# Patient Record
Sex: Female | Born: 1955
Health system: Southern US, Community
[De-identification: ages and names within clinical notes are randomized; demographics above are authoritative.]

## PROBLEM LIST (undated history)

## (undated) DIAGNOSIS — F419 Anxiety disorder, unspecified: Secondary | ICD-10-CM

## (undated) DIAGNOSIS — M1990 Unspecified osteoarthritis, unspecified site: Secondary | ICD-10-CM

## (undated) DIAGNOSIS — H269 Unspecified cataract: Secondary | ICD-10-CM

## (undated) DIAGNOSIS — T7840XA Allergy, unspecified, initial encounter: Secondary | ICD-10-CM

## (undated) DIAGNOSIS — E785 Hyperlipidemia, unspecified: Secondary | ICD-10-CM

## (undated) DIAGNOSIS — I10 Essential (primary) hypertension: Secondary | ICD-10-CM

## (undated) HISTORY — PX: POLYPECTOMY: SHX149

## (undated) HISTORY — PX: COLONOSCOPY: SHX174

## (undated) HISTORY — DX: Hyperlipidemia, unspecified: E78.5

## (undated) HISTORY — DX: Essential (primary) hypertension: I10

## (undated) HISTORY — DX: Allergy, unspecified, initial encounter: T78.40XA

## (undated) HISTORY — DX: Unspecified cataract: H26.9

## (undated) HISTORY — DX: Anxiety disorder, unspecified: F41.9

## (undated) HISTORY — PX: ECTOPIC PREGNANCY SURGERY: SHX613

---

## 1999-06-25 ENCOUNTER — Ambulatory Visit (HOSPITAL_COMMUNITY): Admission: RE | Admit: 1999-06-25 | Discharge: 1999-06-25 | Payer: Self-pay | Admitting: Family Medicine

## 1999-06-25 ENCOUNTER — Encounter: Payer: Self-pay | Admitting: Family Medicine

## 2000-07-19 ENCOUNTER — Other Ambulatory Visit: Admission: RE | Admit: 2000-07-19 | Discharge: 2000-07-19 | Payer: Self-pay | Admitting: Internal Medicine

## 2000-07-26 ENCOUNTER — Encounter: Admission: RE | Admit: 2000-07-26 | Discharge: 2000-07-26 | Payer: Self-pay | Admitting: Internal Medicine

## 2000-07-26 ENCOUNTER — Encounter: Payer: Self-pay | Admitting: Internal Medicine

## 2000-08-03 ENCOUNTER — Ambulatory Visit (HOSPITAL_COMMUNITY): Admission: RE | Admit: 2000-08-03 | Discharge: 2000-08-03 | Payer: Self-pay | Admitting: Gynecology

## 2000-08-03 ENCOUNTER — Encounter: Payer: Self-pay | Admitting: Internal Medicine

## 2001-08-09 ENCOUNTER — Other Ambulatory Visit: Admission: RE | Admit: 2001-08-09 | Discharge: 2001-08-09 | Payer: Self-pay | Admitting: Internal Medicine

## 2003-09-18 ENCOUNTER — Ambulatory Visit (HOSPITAL_COMMUNITY): Admission: RE | Admit: 2003-09-18 | Discharge: 2003-09-18 | Payer: Self-pay | Admitting: Internal Medicine

## 2003-10-23 ENCOUNTER — Encounter: Admission: RE | Admit: 2003-10-23 | Discharge: 2003-10-23 | Payer: Self-pay | Admitting: Internal Medicine

## 2004-02-27 ENCOUNTER — Emergency Department (HOSPITAL_COMMUNITY): Admission: EM | Admit: 2004-02-27 | Discharge: 2004-02-28 | Payer: Self-pay | Admitting: Emergency Medicine

## 2004-08-18 ENCOUNTER — Emergency Department (HOSPITAL_COMMUNITY): Admission: EM | Admit: 2004-08-18 | Discharge: 2004-08-18 | Payer: Self-pay | Admitting: Family Medicine

## 2004-10-22 ENCOUNTER — Emergency Department (HOSPITAL_COMMUNITY): Admission: EM | Admit: 2004-10-22 | Discharge: 2004-10-22 | Payer: Self-pay | Admitting: Family Medicine

## 2004-10-24 ENCOUNTER — Encounter: Admission: RE | Admit: 2004-10-24 | Discharge: 2005-01-22 | Payer: Self-pay | Admitting: Internal Medicine

## 2005-08-14 ENCOUNTER — Encounter: Admission: RE | Admit: 2005-08-14 | Discharge: 2005-08-14 | Payer: Self-pay | Admitting: Internal Medicine

## 2006-05-24 ENCOUNTER — Emergency Department (HOSPITAL_COMMUNITY): Admission: EM | Admit: 2006-05-24 | Discharge: 2006-05-24 | Payer: Self-pay | Admitting: Emergency Medicine

## 2006-06-17 ENCOUNTER — Encounter: Admission: RE | Admit: 2006-06-17 | Discharge: 2006-06-17 | Payer: Self-pay | Admitting: Internal Medicine

## 2007-07-14 ENCOUNTER — Ambulatory Visit (HOSPITAL_COMMUNITY): Admission: RE | Admit: 2007-07-14 | Discharge: 2007-07-14 | Payer: Self-pay | Admitting: Internal Medicine

## 2009-10-03 ENCOUNTER — Ambulatory Visit (HOSPITAL_COMMUNITY): Admission: RE | Admit: 2009-10-03 | Discharge: 2009-10-03 | Payer: Self-pay | Admitting: Internal Medicine

## 2010-09-28 ENCOUNTER — Encounter: Payer: Self-pay | Admitting: Internal Medicine

## 2010-10-08 ENCOUNTER — Other Ambulatory Visit (HOSPITAL_COMMUNITY): Payer: Self-pay | Admitting: Internal Medicine

## 2010-10-08 DIAGNOSIS — Z139 Encounter for screening, unspecified: Secondary | ICD-10-CM

## 2010-10-08 DIAGNOSIS — Z1231 Encounter for screening mammogram for malignant neoplasm of breast: Secondary | ICD-10-CM

## 2010-10-15 ENCOUNTER — Encounter (HOSPITAL_COMMUNITY): Payer: Self-pay

## 2010-10-15 ENCOUNTER — Ambulatory Visit (HOSPITAL_COMMUNITY)
Admission: RE | Admit: 2010-10-15 | Discharge: 2010-10-15 | Disposition: A | Discharge status: Home / Self Care | Payer: BC Managed Care – PPO | Source: Ambulatory Visit | Attending: Internal Medicine | Admitting: Internal Medicine

## 2010-10-15 DIAGNOSIS — Z1231 Encounter for screening mammogram for malignant neoplasm of breast: Secondary | ICD-10-CM | POA: Insufficient documentation

## 2011-09-15 ENCOUNTER — Emergency Department (INDEPENDENT_AMBULATORY_CARE_PROVIDER_SITE_OTHER): Payer: BC Managed Care – PPO

## 2011-09-15 ENCOUNTER — Emergency Department (INDEPENDENT_AMBULATORY_CARE_PROVIDER_SITE_OTHER)
Admission: EM | Admit: 2011-09-15 | Discharge: 2011-09-15 | Disposition: A | Discharge status: Home / Self Care | Payer: BC Managed Care – PPO | Source: Home / Self Care | Attending: Emergency Medicine | Admitting: Emergency Medicine

## 2011-09-15 ENCOUNTER — Encounter (HOSPITAL_COMMUNITY): Payer: Self-pay | Admitting: *Deleted

## 2011-09-15 DIAGNOSIS — R059 Cough, unspecified: Secondary | ICD-10-CM

## 2011-09-15 DIAGNOSIS — R05 Cough: Secondary | ICD-10-CM

## 2011-09-15 HISTORY — DX: Unspecified osteoarthritis, unspecified site: M19.90

## 2011-09-15 MED ORDER — PREDNISONE 10 MG PO TABS: 40.0000 mg | ORAL_TABLET | Freq: Every day | ORAL | Status: DC

## 2011-09-15 MED ORDER — PREDNISONE 20 MG PO TABS: 40.0000 mg | ORAL_TABLET | Freq: Every day | ORAL | Status: DC

## 2011-09-15 NOTE — ED Provider Notes (Addendum)
History     CSN: [id number]  Arrival date & time 09/15/11  1354   First MD Initiated Contact with Patient 09/15/11 1422      Chief Complaint  Patient presents with  . Cough    (Consider location/radiation/quality/duration/timing/severity/associated sxs/prior treatment) HPI Comments: Been coughing so much and hard that my upper back and  its hurting all over" No fevers, my doctor has treated me with antibiotics and steroids and I'm still coughing" Nothing seems to stop this cough" No SOB  Patient is a 56 y.o. female presenting with cough. The history is provided by the patient.  Cough This is a recurrent problem. Episode onset: x 3 weeks. The problem occurs constantly. The problem has not changed since onset.The cough is productive of sputum. There has been no fever. Associated symptoms include chest pain, chills, ear pain, headaches and myalgias. Pertinent negatives include no sweats, no weight loss, no rhinorrhea, no shortness of breath and no wheezing. She has tried cough syrup for the symptoms. The treatment provided no relief. Her past medical history does not include emphysema or asthma.    Past Medical History  Diagnosis Date  . Asthma   . Arthritis     History reviewed. No pertinent past surgical history.  Family History  Problem Relation Age of Onset  . Heart failure Mother   . Cancer Father   . Diabetes Sister     History  Substance Use Topics  . Smoking status: Never Smoker   . Smokeless tobacco: Not on file  . Alcohol Use: No    OB History    Grav Para Term Preterm Abortions TAB SAB Ect Mult Living                  Review of Systems  Constitutional: Positive for chills. Negative for fever, weight loss, activity change, appetite change and fatigue.  HENT: Positive for ear pain. Negative for rhinorrhea.   Eyes: Negative for photophobia.  Respiratory: Positive for cough. Negative for shortness of breath and wheezing.   Cardiovascular: Positive for chest  pain.  Genitourinary: Negative for dysuria.  Musculoskeletal: Positive for myalgias.  Neurological: Positive for headaches.    Allergies  Codeine  Home Medications   Current Outpatient Rx  Name Route Sig Dispense Refill  . BENZONATATE 100 MG PO CAPS Oral Take 100 mg by mouth 2 (two) times daily as needed.      Marland Kitchen MONTELUKAST SODIUM 10 MG PO TABS Oral Take 10 mg by mouth at bedtime.      Marland Kitchen PREDNISONE 20 MG PO TABS Oral Take 2 tablets (40 mg total) by mouth daily. 15 tablet 0    BP 122/61  Pulse 99  Temp(Src) 97.7 F (36.5 C) (Oral)  Resp 16  SpO2 100%  Physical Exam  Nursing note and vitals reviewed. Constitutional: She appears well-developed and well-nourished.  Non-toxic appearance. She does not have a sickly appearance. She does not appear ill. No distress.  HENT:  Mouth/Throat: Uvula is midline, oropharynx is clear and moist and mucous membranes are normal.  Eyes: Conjunctivae are normal.  Neck: Neck supple. No JVD present.  Cardiovascular: Normal rate.   Pulmonary/Chest: Effort normal and breath sounds normal. No respiratory distress. She has no decreased breath sounds. She has no wheezes. She has no rhonchi. She has no rales. She exhibits no tenderness.  Lymphadenopathy:    She has no cervical adenopathy.  Skin: No erythema.    ED Course  Procedures (including critical care time)  Labs Reviewed - No data to display Dg Chest 2 View  09/15/2011  *RADIOLOGY REPORT*  Clinical Data: Cough.  CHEST - 2 VIEW  Comparison: 06/17/2006  Findings: Heart and mediastinal contours are within normal limits. No focal opacities or effusions.  No acute bony abnormality.  IMPRESSION: No active cardiopulmonary disease.  Original Report Authenticated By: Cyndie Chime, M.D.     1. Cough       MDM  Cough- x 3 weeks- recurrent after PCP treatment with antibiotics- cough suppressants and steroids. Upon discharge patient described also having a headache and ears hurting and shoulder  pain.         Jimmie Molly, MD 09/15/11 1515  Jimmie Molly, MD 09/15/11 [phone]

## 2011-09-15 NOTE — ED Notes (Signed)
Pt reports she was treated by her PCP with Zpack, tessalon pearls, and prednisone for asthma and bronchitis the last week.  Today she is not feeling any better.  She has chills, dry cough, weakness and generalized aching.

## 2011-10-06 ENCOUNTER — Other Ambulatory Visit (HOSPITAL_COMMUNITY): Payer: Self-pay | Admitting: Internal Medicine

## 2011-10-06 DIAGNOSIS — Z1231 Encounter for screening mammogram for malignant neoplasm of breast: Secondary | ICD-10-CM

## 2011-11-04 ENCOUNTER — Ambulatory Visit (HOSPITAL_COMMUNITY)
Admission: RE | Admit: 2011-11-04 | Discharge: 2011-11-04 | Disposition: A | Discharge status: Home / Self Care | Payer: BC Managed Care – PPO | Source: Ambulatory Visit | Attending: Internal Medicine | Admitting: Internal Medicine

## 2011-11-04 DIAGNOSIS — Z1231 Encounter for screening mammogram for malignant neoplasm of breast: Secondary | ICD-10-CM | POA: Insufficient documentation

## 2013-02-23 ENCOUNTER — Other Ambulatory Visit: Payer: Self-pay | Admitting: Nurse Practitioner

## 2013-02-23 ENCOUNTER — Ambulatory Visit
Admission: RE | Admit: 2013-02-23 | Discharge: 2013-02-23 | Disposition: A | Discharge status: Home / Self Care | Payer: BC Managed Care – PPO | Source: Ambulatory Visit | Attending: Nurse Practitioner | Admitting: Nurse Practitioner

## 2013-02-23 DIAGNOSIS — R2232 Localized swelling, mass and lump, left upper limb: Secondary | ICD-10-CM

## 2013-02-28 ENCOUNTER — Other Ambulatory Visit: Payer: Self-pay | Admitting: Nurse Practitioner

## 2013-02-28 DIAGNOSIS — R2231 Localized swelling, mass and lump, right upper limb: Secondary | ICD-10-CM

## 2013-03-09 ENCOUNTER — Other Ambulatory Visit (HOSPITAL_COMMUNITY): Payer: Self-pay | Admitting: Internal Medicine

## 2013-03-09 DIAGNOSIS — Z1231 Encounter for screening mammogram for malignant neoplasm of breast: Secondary | ICD-10-CM

## 2013-03-20 ENCOUNTER — Ambulatory Visit (HOSPITAL_COMMUNITY): Payer: BC Managed Care – PPO

## 2013-04-14 ENCOUNTER — Ambulatory Visit (HOSPITAL_COMMUNITY)
Admission: RE | Admit: 2013-04-14 | Discharge: 2013-04-14 | Disposition: A | Discharge status: Home / Self Care | Payer: BC Managed Care – PPO | Source: Ambulatory Visit | Attending: Internal Medicine | Admitting: Internal Medicine

## 2013-04-14 DIAGNOSIS — Z1231 Encounter for screening mammogram for malignant neoplasm of breast: Secondary | ICD-10-CM | POA: Insufficient documentation

## 2013-07-24 ENCOUNTER — Encounter: Payer: Self-pay | Admitting: Internal Medicine

## 2013-07-25 ENCOUNTER — Telehealth: Payer: Self-pay | Admitting: Internal Medicine

## 2013-07-25 NOTE — Telephone Encounter (Signed)
Rec'd from [facility] forward 15 pages to Dr.Perry

## 2013-09-13 ENCOUNTER — Ambulatory Visit (AMBULATORY_SURGERY_CENTER): Payer: Self-pay

## 2013-09-13 VITALS — Ht 62.0 in | Wt 135.0 lb

## 2013-09-13 DIAGNOSIS — Z1211 Encounter for screening for malignant neoplasm of colon: Secondary | ICD-10-CM

## 2013-09-13 MED ORDER — MOVIPREP 100 G PO SOLR: 1.0000 | Freq: Once | ORAL | Status: DC

## 2013-09-14 ENCOUNTER — Encounter: Payer: Self-pay | Admitting: Internal Medicine

## 2013-09-27 ENCOUNTER — Encounter: Payer: BC Managed Care – PPO | Admitting: Internal Medicine

## 2013-09-28 ENCOUNTER — Encounter: Payer: Self-pay | Admitting: Internal Medicine

## 2013-09-28 ENCOUNTER — Ambulatory Visit (AMBULATORY_SURGERY_CENTER): Payer: BC Managed Care – PPO | Admitting: Internal Medicine

## 2013-09-28 VITALS — BP 138/53 | HR 83 | Temp 97.5°F | Resp 26 | Ht 62.0 in | Wt 135.0 lb

## 2013-09-28 DIAGNOSIS — D126 Benign neoplasm of colon, unspecified: Secondary | ICD-10-CM

## 2013-09-28 DIAGNOSIS — Z1211 Encounter for screening for malignant neoplasm of colon: Secondary | ICD-10-CM

## 2013-09-28 LAB — HM COLONOSCOPY

## 2013-09-28 MED ORDER — SODIUM CHLORIDE 0.9 % IV SOLN: 500.0000 mL | INTRAVENOUS | Status: DC

## 2013-09-28 NOTE — Op Note (Signed)
[place]  [address]. [place], [address]   COLONOSCOPY PROCEDURE REPORT  PATIENT: Julie Rivas, Julie Rivas  MR#: [id number] BIRTHDATE: 06/04/1956 , 2  yrs. old GENDER: Female ENDOSCOPIST: Eustace Quail, MD REFERRED Edwinna Areola, M.D. PROCEDURE DATE:  09/28/2013 PROCEDURE:   Colonoscopy with snare polypectomy x 1 First Screening Colonoscopy - Avg.  risk and is 50 yrs.  old or older Yes.  Prior Negative Screening - Now for repeat screening. N/A  History of Adenoma - Now for follow-up colonoscopy & has been > or = to 3 yrs.  N/A  Polyps Removed Today? Yes. ASA CLASS:   Class II INDICATIONS:average risk screening. MEDICATIONS: MAC sedation, administered by CRNA and propofol (Diprivan) 270mg  IV  DESCRIPTION OF PROCEDURE:   After the risks benefits and alternatives of the procedure were thoroughly explained, informed consent was obtained.  A digital rectal exam revealed no abnormalities of the rectum.   The LB [id number]  endoscope was introduced through the anus and advanced to the cecum, which was identified by both the appendix and ileocecal valve. No adverse events experienced.   The quality of the prep was excellent, using MoviPrep  The instrument was then slowly withdrawn as the colon was fully examined.   COLON FINDINGS: A diminutive polyp was found in the transverse colon.  A polypectomy was performed with a cold snare.  The resection was complete and the polyp tissue was completely retrieved.   Mild diverticulosis was noted in the sigmoid colon. The colon mucosa was otherwise normal.  Retroflexed views revealed internal hemorrhoids. The time to cecum=1 minutes 54 seconds. Withdrawal time=13 minutes 03 seconds.  The scope was withdrawn and the procedure completed. COMPLICATIONS: There were no complications.  ENDOSCOPIC IMPRESSION: 1.   Diminutive polyp was found in the transverse colon; polypectomy was performed with a cold snare 2.   Mild  diverticulosis was noted in the sigmoid colon 3.   The colon mucosa was otherwise normal  RECOMMENDATIONS: 1. Repeat colonoscopy in 5 years if polyp adenomatous; otherwise 10 years   eSigned:  Eustace Quail, MD 09/28/2013 11:21 AM   cc: Heath Gold, MD and The Patient

## 2013-09-28 NOTE — Progress Notes (Signed)
A/ox3 pleased with MAC, report to Jane RN 

## 2013-09-28 NOTE — Progress Notes (Signed)
Called to room to assist during endoscopic procedure.  Patient ID and intended procedure confirmed with present staff. Received instructions for my participation in the procedure from the performing physician.  

## 2013-09-28 NOTE — Patient Instructions (Signed)

## 2013-09-29 ENCOUNTER — Telehealth: Payer: Self-pay | Admitting: *Deleted

## 2013-09-29 NOTE — Telephone Encounter (Signed)
No answer, message left for the patient. 

## 2013-10-04 ENCOUNTER — Encounter: Payer: Self-pay | Admitting: Internal Medicine

## 2014-08-14 ENCOUNTER — Other Ambulatory Visit (HOSPITAL_COMMUNITY): Payer: Self-pay | Admitting: Internal Medicine

## 2014-08-14 DIAGNOSIS — Z1231 Encounter for screening mammogram for malignant neoplasm of breast: Secondary | ICD-10-CM

## 2014-08-28 ENCOUNTER — Ambulatory Visit (HOSPITAL_COMMUNITY)
Admission: RE | Admit: 2014-08-28 | Discharge: 2014-08-28 | Disposition: A | Discharge status: Home / Self Care | Payer: BC Managed Care – PPO | Source: Ambulatory Visit | Attending: Internal Medicine | Admitting: Internal Medicine

## 2014-08-28 DIAGNOSIS — Z1231 Encounter for screening mammogram for malignant neoplasm of breast: Secondary | ICD-10-CM

## 2014-11-06 ENCOUNTER — Other Ambulatory Visit: Payer: Self-pay | Admitting: Family

## 2014-11-06 ENCOUNTER — Ambulatory Visit
Admission: RE | Admit: 2014-11-06 | Discharge: 2014-11-06 | Disposition: A | Discharge status: Home / Self Care | Payer: BC Managed Care – PPO | Source: Ambulatory Visit | Attending: Family | Admitting: Family

## 2014-11-06 DIAGNOSIS — M25512 Pain in left shoulder: Secondary | ICD-10-CM

## 2015-10-14 ENCOUNTER — Telehealth: Payer: Self-pay | Admitting: General Practice

## 2015-10-14 NOTE — Telephone Encounter (Signed)
Pt states you told her husband Zahaira Lowing that you would take her on as a new patient.  Please advise

## 2015-10-14 NOTE — Telephone Encounter (Signed)
yes

## 2015-10-14 NOTE — Telephone Encounter (Signed)
Informed pt on vm °

## 2015-10-15 ENCOUNTER — Other Ambulatory Visit: Payer: Self-pay

## 2015-10-15 DIAGNOSIS — Z1231 Encounter for screening mammogram for malignant neoplasm of breast: Secondary | ICD-10-CM

## 2015-10-30 ENCOUNTER — Ambulatory Visit
Admission: RE | Admit: 2015-10-30 | Discharge: 2015-10-30 | Disposition: A | Discharge status: Home / Self Care | Payer: BC Managed Care – PPO | Source: Ambulatory Visit

## 2015-10-30 ENCOUNTER — Encounter: Payer: Self-pay | Admitting: Internal Medicine

## 2015-10-30 ENCOUNTER — Ambulatory Visit (INDEPENDENT_AMBULATORY_CARE_PROVIDER_SITE_OTHER): Payer: BC Managed Care – PPO | Admitting: Internal Medicine

## 2015-10-30 ENCOUNTER — Other Ambulatory Visit (INDEPENDENT_AMBULATORY_CARE_PROVIDER_SITE_OTHER): Payer: BC Managed Care – PPO

## 2015-10-30 ENCOUNTER — Telehealth: Payer: Self-pay | Admitting: Internal Medicine

## 2015-10-30 ENCOUNTER — Other Ambulatory Visit (HOSPITAL_COMMUNITY)
Admission: RE | Admit: 2015-10-30 | Discharge: 2015-10-30 | Disposition: A | Discharge status: Home / Self Care | Payer: BC Managed Care – PPO | Source: Ambulatory Visit | Attending: Internal Medicine | Admitting: Internal Medicine

## 2015-10-30 VITALS — BP 120/86 | HR 82 | Temp 98.9°F | Resp 16 | Ht 62.0 in | Wt 144.0 lb

## 2015-10-30 DIAGNOSIS — Z23 Encounter for immunization: Secondary | ICD-10-CM | POA: Diagnosis not present

## 2015-10-30 DIAGNOSIS — E785 Hyperlipidemia, unspecified: Secondary | ICD-10-CM

## 2015-10-30 DIAGNOSIS — Z1231 Encounter for screening mammogram for malignant neoplasm of breast: Secondary | ICD-10-CM

## 2015-10-30 DIAGNOSIS — N760 Acute vaginitis: Secondary | ICD-10-CM

## 2015-10-30 DIAGNOSIS — M5431 Sciatica, right side: Secondary | ICD-10-CM | POA: Diagnosis not present

## 2015-10-30 DIAGNOSIS — J301 Allergic rhinitis due to pollen: Secondary | ICD-10-CM

## 2015-10-30 DIAGNOSIS — Z113 Encounter for screening for infections with a predominantly sexual mode of transmission: Secondary | ICD-10-CM | POA: Diagnosis present

## 2015-10-30 DIAGNOSIS — Z0000 Encounter for general adult medical examination without abnormal findings: Secondary | ICD-10-CM | POA: Diagnosis not present

## 2015-10-30 DIAGNOSIS — N952 Postmenopausal atrophic vaginitis: Secondary | ICD-10-CM | POA: Insufficient documentation

## 2015-10-30 DIAGNOSIS — Z01419 Encounter for gynecological examination (general) (routine) without abnormal findings: Secondary | ICD-10-CM | POA: Diagnosis not present

## 2015-10-30 DIAGNOSIS — J4540 Moderate persistent asthma, uncomplicated: Secondary | ICD-10-CM

## 2015-10-30 LAB — COMPREHENSIVE METABOLIC PANEL
ALBUMIN: 4.3 g/dL (ref 3.5–5.2)
ALK PHOS: 87 U/L (ref 39–117)
ALT: 15 U/L (ref 0–35)
AST: 15 U/L (ref 0–37)
BUN: 12 mg/dL (ref 6–23)
CO2: 32 mEq/L (ref 19–32)
Calcium: 9.5 mg/dL (ref 8.4–10.5)
Chloride: 104 mEq/L (ref 96–112)
Creatinine, Ser: 0.65 mg/dL (ref 0.40–1.20)
GFR: 118.89 mL/min (ref >60.00)
Glucose, Bld: 100 mg/dL — ABNORMAL HIGH (ref 70–99)
POTASSIUM: 3.7 meq/L (ref 3.5–5.1)
Sodium: 142 mEq/L (ref 135–145)
TOTAL PROTEIN: 7.2 g/dL (ref 6.0–8.3)
Total Bilirubin: 0.5 mg/dL (ref 0.2–1.2)

## 2015-10-30 LAB — URINALYSIS, ROUTINE W REFLEX MICROSCOPIC
BILIRUBIN URINE: NEGATIVE
Ketones, ur: NEGATIVE
Leukocytes, UA: NEGATIVE
NITRITE: NEGATIVE
Specific Gravity, Urine: 1.015 (ref 1.000–1.030)
Total Protein, Urine: NEGATIVE
UROBILINOGEN UA: 0.2 (ref 0.0–1.0)
Urine Glucose: NEGATIVE
WBC, UA: NONE SEEN (ref 0–?)
pH: 6.5 (ref 5.0–8.0)

## 2015-10-30 LAB — CBC WITH DIFFERENTIAL/PLATELET
Basophils Absolute: 0.1 10*3/uL (ref 0.0–0.1)
Basophils Relative: 1.1 % (ref 0.0–3.0)
EOS PCT: 1.1 % (ref 0.0–5.0)
Eosinophils Absolute: 0.1 10*3/uL (ref 0.0–0.7)
HCT: 41.7 % (ref 36.0–46.0)
Hemoglobin: 14.1 g/dL (ref 12.0–15.0)
Lymphocytes Relative: 35.8 % (ref 12.0–46.0)
Lymphs Abs: 2.4 10*3/uL (ref 0.7–4.0)
MCHC: 33.9 g/dL (ref 30.0–36.0)
MCV: 93.4 fl (ref 78.0–100.0)
MONOS PCT: 6.7 % (ref 3.0–12.0)
Monocytes Absolute: 0.5 10*3/uL (ref 0.1–1.0)
Neutro Abs: 3.8 10*3/uL (ref 1.4–7.7)
Neutrophils Relative %: 55.3 % (ref 43.0–77.0)
Platelets: 261 10*3/uL (ref 150.0–400.0)
RBC: 4.47 Mil/uL (ref 3.87–5.11)
RDW: 13.1 % (ref 11.5–15.5)
WBC: 6.8 10*3/uL (ref 4.0–10.5)

## 2015-10-30 LAB — LIPID PANEL
CHOLESTEROL: 292 mg/dL — AB (ref 0–200)
HDL: 57.5 mg/dL (ref >39.00)
LDL Cholesterol: 216 mg/dL — ABNORMAL HIGH (ref 0–99)
NonHDL: 234.31
Total CHOL/HDL Ratio: 5
Triglycerides: 93 mg/dL (ref 0.0–149.0)
VLDL: 18.6 mg/dL (ref 0.0–40.0)

## 2015-10-30 LAB — HM MAMMOGRAPHY

## 2015-10-30 LAB — HEPATITIS C ANTIBODY: HCV AB: NEGATIVE

## 2015-10-30 LAB — PSA

## 2015-10-30 LAB — HEMOGLOBIN A1C: HEMOGLOBIN A1C: 6 % (ref 4.6–6.5)

## 2015-10-30 LAB — TSH: TSH: 0.7 u[IU]/mL (ref 0.35–4.50)

## 2015-10-30 MED ORDER — MONTELUKAST SODIUM 10 MG PO TABS: 10.0000 mg | ORAL_TABLET | Freq: Every day | ORAL | Status: DC

## 2015-10-30 MED ORDER — ESTRADIOL 0.1 MG/GM VA CREA: 1.0000 | TOPICAL_CREAM | Freq: Every day | VAGINAL | Status: DC

## 2015-10-30 MED ORDER — GABAPENTIN 300 MG PO CAPS: 300.0000 mg | ORAL_CAPSULE | Freq: Every day | ORAL | Status: DC

## 2015-10-30 MED ORDER — FLUTICASONE PROPIONATE 50 MCG/ACT NA SUSP: 2.0000 | Freq: Every day | NASAL | Status: DC

## 2015-10-30 MED ORDER — FLUTICASONE FUROATE-VILANTEROL 200-25 MCG/INH IN AEPB: 1.0000 | INHALATION_SPRAY | Freq: Every day | RESPIRATORY_TRACT | Status: DC

## 2015-10-30 NOTE — Progress Notes (Deleted)
   Subjective:  Patient ID: Julie Rivas, female    DOB: [birth year]  Age: 60 y.o. MRN: [id number]  CC: No chief complaint on file.   HPI Julie Rivas presents for ***  Outpatient Prescriptions Prior to Visit  Medication Sig Dispense Refill  . carisoprodol (SOMA) 350 MG tablet Take 350 mg by mouth 2 (two) times daily as needed for muscle spasms. Back strain    . ezetimibe (ZETIA) 10 MG tablet Take 10 mg by mouth daily.    Marland Kitchen gabapentin (NEURONTIN) 300 MG capsule Take 300 mg by mouth at bedtime as needed.    Marland Kitchen HYDROcodone-acetaminophen (NORCO) 10-325 MG per tablet Take 1 tablet by mouth every 6 (six) hours as needed.    Marland Kitchen ibuprofen (ADVIL,MOTRIN) 800 MG tablet Take 800 mg by mouth every 8 (eight) hours as needed.    . montelukast (SINGULAIR) 10 MG tablet Take 10 mg by mouth at bedtime.      . Multiple Vitamins-Minerals (MULTIVITAMIN PO) Take by mouth daily.    . naproxen (NAPROSYN) 500 MG tablet Take 500 mg by mouth 2 (two) times daily with a meal.    . potassium chloride (K-DUR) 10 MEQ tablet Take 10 mEq by mouth daily.    . tizanidine (ZANAFLEX) 2 MG capsule Take 2 mg by mouth 4 (four) times daily as needed for muscle spasms.    . traMADol (ULTRAM) 50 MG tablet Take 50 mg by mouth every 6 (six) hours as needed.    . triamterene-hydrochlorothiazide (DYAZIDE) 37.5-25 MG per capsule Take 1 capsule by mouth daily.    . benzonatate (TESSALON) 100 MG capsule Take 100 mg by mouth 2 (two) times daily as needed.       No facility-administered medications prior to visit.    ROS Review of Systems  Objective:  BP 120/86 mmHg  Pulse 82  Temp(Src) 98.9 F (37.2 C) (Oral)  Ht 5\' 2"  (1.575 m)  Wt 144 lb (65.318 kg)  BMI 26.33 kg/m2  SpO2 95%  BP Readings from Last 3 Encounters:  10/30/15 120/86  09/28/13 138/53  09/15/11 122/61    Wt Readings from Last 3 Encounters:  10/30/15 144 lb (65.318 kg)  09/28/13 135 lb (61.236 kg)  09/13/13 135 lb (61.236 kg)    Physical Exam  No  results found for: WBC, HGB, HCT, PLT, GLUCOSE, CHOL, TRIG, HDL, LDLDIRECT, LDLCALC, ALT, AST, NA, K, CL, CREATININE, BUN, CO2, TSH, PSA, INR, GLUF, HGBA1C, MICROALBUR  Dg Shoulder Left  11/06/2014  CLINICAL DATA:  Chronic progressive left shoulder pain with decreased range of motion. EXAM: LEFT SHOULDER - 2+ VIEW COMPARISON:  10/22/2004 FINDINGS: There is slight degenerative arthritic changes of the glenohumeral joint and at the acromioclavicular joint as well as a small degenerative cyst in the greater tuberosity of the proximal humerus. No soft tissue calcifications. IMPRESSION: Degenerative and arthritic changes as described. Electronically Signed   By: Lorriane Shire M.D.   On: 11/06/2014 16:41    Assessment & Plan:   There are no diagnoses linked to this encounter. I have discontinued Julie Rivas's benzonatate. I am also having her maintain her montelukast, carisoprodol, ezetimibe, naproxen, HYDROcodone-acetaminophen, ibuprofen, triamterene-hydrochlorothiazide, gabapentin, traMADol, tizanidine, potassium chloride, and Multiple Vitamins-Minerals (MULTIVITAMIN PO).  No orders of the defined types were placed in this encounter.     Follow-up: No Follow-up on file.  Scarlette Calico, MD

## 2015-10-30 NOTE — Patient Instructions (Signed)
Preventive Care for Adults, Female A healthy lifestyle and preventive care can promote health and wellness. Preventive health guidelines for women include the following key practices.  A routine yearly physical is a good way to check with your health care provider about your health and preventive screening. It is a chance to share any concerns and updates on your health and to receive a thorough exam.  Visit your dentist for a routine exam and preventive care every 6 months. Brush your teeth twice a day and floss once a day. Good oral hygiene prevents tooth decay and gum disease.  The frequency of eye exams is based on your age, health, family medical history, use of contact lenses, and other factors. Follow your health care provider's recommendations for frequency of eye exams.  Eat a healthy diet. Foods like vegetables, fruits, whole grains, low-fat dairy products, and lean protein foods contain the nutrients you need without too many calories. Decrease your intake of foods high in solid fats, added sugars, and salt. Eat the right amount of calories for you.Get information about a proper diet from your health care provider, if necessary.  Regular physical exercise is one of the most important things you can do for your health. Most adults should get at least 150 minutes of moderate-intensity exercise (any activity that increases your heart rate and causes you to sweat) each week. In addition, most adults need muscle-strengthening exercises on 2 or more days a week.  Maintain a healthy weight. The body mass index (BMI) is a screening tool to identify possible weight problems. It provides an estimate of body fat based on height and weight. Your health care provider can find your BMI and can help you achieve or maintain a healthy weight.For adults 20 years and older:  A BMI below 18.5 is considered underweight.  A BMI of 18.5 to 24.9 is normal.  A BMI of 25 to 29.9 is considered overweight.  A  BMI of 30 and above is considered obese.  Maintain normal blood lipids and cholesterol levels by exercising and minimizing your intake of saturated fat. Eat a balanced diet with plenty of fruit and vegetables. Blood tests for lipids and cholesterol should begin at age 45 and be repeated every 5 years. If your lipid or cholesterol levels are high, you are over 50, or you are at high risk for heart disease, you may need your cholesterol levels checked more frequently.Ongoing high lipid and cholesterol levels should be treated with medicines if diet and exercise are not working.  If you smoke, find out from your health care provider how to quit. If you do not use tobacco, do not start.  Lung cancer screening is recommended for adults aged 45-80 years who are at high risk for developing lung cancer because of a history of smoking. A yearly low-dose CT scan of the lungs is recommended for people who have at least a 30-pack-year history of smoking and are a current smoker or have quit within the past 15 years. A pack year of smoking is smoking an average of 1 pack of cigarettes a day for 1 year (for example: 1 pack a day for 30 years or 2 packs a day for 15 years). Yearly screening should continue until the smoker has stopped smoking for at least 15 years. Yearly screening should be stopped for people who develop a health problem that would prevent them from having lung cancer treatment.  If you are pregnant, do not drink alcohol. If you are  breastfeeding, be very cautious about drinking alcohol. If you are not pregnant and choose to drink alcohol, do not have more than 1 drink per day. One drink is considered to be 12 ounces (355 mL) of beer, 5 ounces (148 mL) of wine, or 1.5 ounces (44 mL) of liquor.  Avoid use of street drugs. Do not share needles with anyone. Ask for help if you need support or instructions about stopping the use of drugs.  High blood pressure causes heart disease and increases the risk  of stroke. Your blood pressure should be checked at least every 1 to 2 years. Ongoing high blood pressure should be treated with medicines if weight loss and exercise do not work.  If you are 55-79 years old, ask your health care provider if you should take aspirin to prevent strokes.  Diabetes screening is done by taking a blood sample to check your blood glucose level after you have not eaten for a certain period of time (fasting). If you are not overweight and you do not have risk factors for diabetes, you should be screened once every 3 years starting at age 45. If you are overweight or obese and you are 40-70 years of age, you should be screened for diabetes every year as part of your cardiovascular risk assessment.  Breast cancer screening is essential preventive care for women. You should practice "breast self-awareness." This means understanding the normal appearance and feel of your breasts and may include breast self-examination. Any changes detected, no matter how small, should be reported to a health care provider. Women in their 20s and 30s should have a clinical breast exam (CBE) by a health care provider as part of a regular health exam every 1 to 3 years. After age 40, women should have a CBE every year. Starting at age 40, women should consider having a mammogram (breast X-ray test) every year. Women who have a family history of breast cancer should talk to their health care provider about genetic screening. Women at a high risk of breast cancer should talk to their health care providers about having an MRI and a mammogram every year.  Breast cancer gene (BRCA)-related cancer risk assessment is recommended for women who have family members with BRCA-related cancers. BRCA-related cancers include breast, ovarian, tubal, and peritoneal cancers. Having family members with these cancers may be associated with an increased risk for harmful changes (mutations) in the breast cancer genes BRCA1 and  BRCA2. Results of the assessment will determine the need for genetic counseling and BRCA1 and BRCA2 testing.  Your health care provider may recommend that you be screened regularly for cancer of the pelvic organs (ovaries, uterus, and vagina). This screening involves a pelvic examination, including checking for microscopic changes to the surface of your cervix (Pap test). You may be encouraged to have this screening done every 3 years, beginning at age 21.  For women ages 30-65, health care providers may recommend pelvic exams and Pap testing every 3 years, or they may recommend the Pap and pelvic exam, combined with testing for human papilloma virus (HPV), every 5 years. Some types of HPV increase your risk of cervical cancer. Testing for HPV may also be done on women of any age with unclear Pap test results.  Other health care providers may not recommend any screening for nonpregnant women who are considered low risk for pelvic cancer and who do not have symptoms. Ask your health care provider if a screening pelvic exam is right for   you.  If you have had past treatment for cervical cancer or a condition that could lead to cancer, you need Pap tests and screening for cancer for at least 20 years after your treatment. If Pap tests have been discontinued, your risk factors (such as having a new sexual partner) need to be reassessed to determine if screening should resume. Some women have medical problems that increase the chance of getting cervical cancer. In these cases, your health care provider may recommend more frequent screening and Pap tests.  Colorectal cancer can be detected and often prevented. Most routine colorectal cancer screening begins at the age of 50 years and continues through age 75 years. However, your health care provider may recommend screening at an earlier age if you have risk factors for colon cancer. On a yearly basis, your health care provider may provide home test kits to check  for hidden blood in the stool. Use of a small camera at the end of a tube, to directly examine the colon (sigmoidoscopy or colonoscopy), can detect the earliest forms of colorectal cancer. Talk to your health care provider about this at age 50, when routine screening begins. Direct exam of the colon should be repeated every 5-10 years through age 75 years, unless early forms of precancerous polyps or small growths are found.  People who are at an increased risk for hepatitis B should be screened for this virus. You are considered at high risk for hepatitis B if:  You were born in a country where hepatitis B occurs often. Talk with your health care provider about which countries are considered high risk.  Your parents were born in a high-risk country and you have not received a shot to protect against hepatitis B (hepatitis B vaccine).  You have HIV or AIDS.  You use needles to inject street drugs.  You live with, or have sex with, someone who has hepatitis B.  You get hemodialysis treatment.  You take certain medicines for conditions like cancer, organ transplantation, and autoimmune conditions.  Hepatitis C blood testing is recommended for all people born from 1945 through 1965 and any individual with known risks for hepatitis C.  Practice safe sex. Use condoms and avoid high-risk sexual practices to reduce the spread of sexually transmitted infections (STIs). STIs include gonorrhea, chlamydia, syphilis, trichomonas, herpes, HPV, and human immunodeficiency virus (HIV). Herpes, HIV, and HPV are viral illnesses that have no cure. They can result in disability, cancer, and death.  You should be screened for sexually transmitted illnesses (STIs) including gonorrhea and chlamydia if:  You are sexually active and are younger than 24 years.  You are older than 24 years and your health care provider tells you that you are at risk for this type of infection.  Your sexual activity has changed  since you were last screened and you are at an increased risk for chlamydia or gonorrhea. Ask your health care provider if you are at risk.  If you are at risk of being infected with HIV, it is recommended that you take a prescription medicine daily to prevent HIV infection. This is called preexposure prophylaxis (PrEP). You are considered at risk if:  You are sexually active and do not regularly use condoms or know the HIV status of your partner(s).  You take drugs by injection.  You are sexually active with a partner who has HIV.  Talk with your health care provider about whether you are at high risk of being infected with HIV. If   you choose to begin PrEP, you should first be tested for HIV. You should then be tested every 3 months for as long as you are taking PrEP.  Osteoporosis is a disease in which the bones lose minerals and strength with aging. This can result in serious bone fractures or breaks. The risk of osteoporosis can be identified using a bone density scan. Women ages 67 years and over and women at risk for fractures or osteoporosis should discuss screening with their health care providers. Ask your health care provider whether you should take a calcium supplement or vitamin D to reduce the rate of osteoporosis.  Menopause can be associated with physical symptoms and risks. Hormone replacement therapy is available to decrease symptoms and risks. You should talk to your health care provider about whether hormone replacement therapy is right for you.  Use sunscreen. Apply sunscreen liberally and repeatedly throughout the day. You should seek shade when your shadow is shorter than you. Protect yourself by wearing long sleeves, pants, a wide-brimmed hat, and sunglasses year round, whenever you are outdoors.  Once a month, do a whole body skin exam, using a mirror to look at the skin on your back. Tell your health care provider of new moles, moles that have irregular borders, moles that  are larger than a pencil eraser, or moles that have changed in shape or color.  Stay current with required vaccines (immunizations).  Influenza vaccine. All adults should be immunized every year.  Tetanus, diphtheria, and acellular pertussis (Td, Tdap) vaccine. Pregnant women should receive 1 dose of Tdap vaccine during each pregnancy. The dose should be obtained regardless of the length of time since the last dose. Immunization is preferred during the 27th-36th week of gestation. An adult who has not previously received Tdap or who does not know her vaccine status should receive 1 dose of Tdap. This initial dose should be followed by tetanus and diphtheria toxoids (Td) booster doses every 10 years. Adults with an unknown or incomplete history of completing a 3-dose immunization series with Td-containing vaccines should begin or complete a primary immunization series including a Tdap dose. Adults should receive a Td booster every 10 years.  Varicella vaccine. An adult without evidence of immunity to varicella should receive 2 doses or a second dose if she has previously received 1 dose. Pregnant females who do not have evidence of immunity should receive the first dose after pregnancy. This first dose should be obtained before leaving the health care facility. The second dose should be obtained 4-8 weeks after the first dose.  Human papillomavirus (HPV) vaccine. Females aged 13-26 years who have not received the vaccine previously should obtain the 3-dose series. The vaccine is not recommended for use in pregnant females. However, pregnancy testing is not needed before receiving a dose. If a female is found to be pregnant after receiving a dose, no treatment is needed. In that case, the remaining doses should be delayed until after the pregnancy. Immunization is recommended for any person with an immunocompromised condition through the age of 61 years if she did not get any or all doses earlier. During the  3-dose series, the second dose should be obtained 4-8 weeks after the first dose. The third dose should be obtained 24 weeks after the first dose and 16 weeks after the second dose.  Zoster vaccine. One dose is recommended for adults aged 30 years or older unless certain conditions are present.  Measles, mumps, and rubella (MMR) vaccine. Adults born  before 1957 generally are considered immune to measles and mumps. Adults born in 1957 or later should have 1 or more doses of MMR vaccine unless there is a contraindication to the vaccine or there is laboratory evidence of immunity to each of the three diseases. A routine second dose of MMR vaccine should be obtained at least 28 days after the first dose for students attending postsecondary schools, health care workers, or international travelers. People who received inactivated measles vaccine or an unknown type of measles vaccine during 1963-1967 should receive 2 doses of MMR vaccine. People who received inactivated mumps vaccine or an unknown type of mumps vaccine before 1979 and are at high risk for mumps infection should consider immunization with 2 doses of MMR vaccine. For females of childbearing age, rubella immunity should be determined. If there is no evidence of immunity, females who are not pregnant should be vaccinated. If there is no evidence of immunity, females who are pregnant should delay immunization until after pregnancy. Unvaccinated health care workers born before 1957 who lack laboratory evidence of measles, mumps, or rubella immunity or laboratory confirmation of disease should consider measles and mumps immunization with 2 doses of MMR vaccine or rubella immunization with 1 dose of MMR vaccine.  Pneumococcal 13-valent conjugate (PCV13) vaccine. When indicated, a person who is uncertain of his immunization history and has no record of immunization should receive the PCV13 vaccine. All adults 65 years of age and older should receive this  vaccine. An adult aged 19 years or older who has certain medical conditions and has not been previously immunized should receive 1 dose of PCV13 vaccine. This PCV13 should be followed with a dose of pneumococcal polysaccharide (PPSV23) vaccine. Adults who are at high risk for pneumococcal disease should obtain the PPSV23 vaccine at least 8 weeks after the dose of PCV13 vaccine. Adults older than 62 years of age who have normal immune system function should obtain the PPSV23 vaccine dose at least 1 year after the dose of PCV13 vaccine.  Pneumococcal polysaccharide (PPSV23) vaccine. When PCV13 is also indicated, PCV13 should be obtained first. All adults aged 65 years and older should be immunized. An adult younger than age 65 years who has certain medical conditions should be immunized. Any person who resides in a nursing home or long-term care facility should be immunized. An adult smoker should be immunized. People with an immunocompromised condition and certain other conditions should receive both PCV13 and PPSV23 vaccines. People with human immunodeficiency virus (HIV) infection should be immunized as soon as possible after diagnosis. Immunization during chemotherapy or radiation therapy should be avoided. Routine use of PPSV23 vaccine is not recommended for American Indians, [place] Natives, or people younger than 65 years unless there are medical conditions that require PPSV23 vaccine. When indicated, people who have unknown immunization and have no record of immunization should receive PPSV23 vaccine. One-time revaccination 5 years after the first dose of PPSV23 is recommended for people aged 19-64 years who have chronic kidney failure, nephrotic syndrome, asplenia, or immunocompromised conditions. People who received 1-2 doses of PPSV23 before age 65 years should receive another dose of PPSV23 vaccine at age 65 years or later if at least 5 years have passed since the previous dose. Doses of PPSV23 are not  needed for people immunized with PPSV23 at or after age 65 years.  Meningococcal vaccine. Adults with asplenia or persistent complement component deficiencies should receive 2 doses of quadrivalent meningococcal conjugate (MenACWY-D) vaccine. The doses should be obtained   at least 2 months apart. Microbiologists working with certain meningococcal bacteria, [place] recruits, people at risk during an outbreak, and people who travel to or live in countries with a high rate of meningitis should be immunized. A first-year college student up through age 34 years who is living in a residence hall should receive a dose if she did not receive a dose on or after her 16th birthday. Adults who have certain high-risk conditions should receive one or more doses of vaccine.  Hepatitis A vaccine. Adults who wish to be protected from this disease, have certain high-risk conditions, work with hepatitis A-infected animals, work in hepatitis A research labs, or travel to or work in countries with a high rate of hepatitis A should be immunized. Adults who were previously unvaccinated and who anticipate close contact with an international adoptee during the first 60 days after arrival in the [place] from a country with a high rate of hepatitis A should be immunized.  Hepatitis B vaccine. Adults who wish to be protected from this disease, have certain high-risk conditions, may be exposed to blood or other infectious body fluids, are household contacts or sex partners of hepatitis B positive people, are clients or workers in certain care facilities, or travel to or work in countries with a high rate of hepatitis B should be immunized.  Haemophilus influenzae type b (Hib) vaccine. A previously unvaccinated person with asplenia or sickle cell disease or having a scheduled splenectomy should receive 1 dose of Hib vaccine. Regardless of previous immunization, a recipient of a hematopoietic stem cell transplant should receive a  3-dose series 6-12 months after her successful transplant. Hib vaccine is not recommended for adults with HIV infection. Preventive Services / Frequency Ages 35 to 4 years  Blood pressure check.** / Every 3-5 years.  Lipid and cholesterol check.** / Every 5 years beginning at age 60.  Clinical breast exam.** / Every 3 years for women in their 71s and 10s.  BRCA-related cancer risk assessment.** / For women who have family members with a BRCA-related cancer (breast, ovarian, tubal, or peritoneal cancers).  Pap test.** / Every 2 years from ages 76 through 26. Every 3 years starting at age 61 through age 76 or 93 with a history of 3 consecutive normal Pap tests.  HPV screening.** / Every 3 years from ages 37 through ages 60 to 51 with a history of 3 consecutive normal Pap tests.  Hepatitis C blood test.** / For any individual with known risks for hepatitis C.  Skin self-exam. / Monthly.  Influenza vaccine. / Every year.  Tetanus, diphtheria, and acellular pertussis (Tdap, Td) vaccine.** / Consult your health care provider. Pregnant women should receive 1 dose of Tdap vaccine during each pregnancy. 1 dose of Td every 10 years.  Varicella vaccine.** / Consult your health care provider. Pregnant females who do not have evidence of immunity should receive the first dose after pregnancy.  HPV vaccine. / 3 doses over 6 months, if 93 and younger. The vaccine is not recommended for use in pregnant females. However, pregnancy testing is not needed before receiving a dose.  Measles, mumps, rubella (MMR) vaccine.** / You need at least 1 dose of MMR if you were born in 1957 or later. You may also need a 2nd dose. For females of childbearing age, rubella immunity should be determined. If there is no evidence of immunity, females who are not pregnant should be vaccinated. If there is no evidence of immunity, females who are  pregnant should delay immunization until after pregnancy.  Pneumococcal  13-valent conjugate (PCV13) vaccine.** / Consult your health care provider.  Pneumococcal polysaccharide (PPSV23) vaccine.** / 1 to 2 doses if you smoke cigarettes or if you have certain conditions.  Meningococcal vaccine.** / 1 dose if you are age 68 to 8 years and a Market researcher living in a residence hall, or have one of several medical conditions, you need to get vaccinated against meningococcal disease. You may also need additional booster doses.  Hepatitis A vaccine.** / Consult your health care provider.  Hepatitis B vaccine.** / Consult your health care provider.  Haemophilus influenzae type b (Hib) vaccine.** / Consult your health care provider. Ages 7 to 53 years  Blood pressure check.** / Every year.  Lipid and cholesterol check.** / Every 5 years beginning at age 25 years.  Lung cancer screening. / Every year if you are aged 11-80 years and have a 30-pack-year history of smoking and currently smoke or have quit within the past 15 years. Yearly screening is stopped once you have quit smoking for at least 15 years or develop a health problem that would prevent you from having lung cancer treatment.  Clinical breast exam.** / Every year after age 48 years.  BRCA-related cancer risk assessment.** / For women who have family members with a BRCA-related cancer (breast, ovarian, tubal, or peritoneal cancers).  Mammogram.** / Every year beginning at age 41 years and continuing for as long as you are in good health. Consult with your health care provider.  Pap test.** / Every 3 years starting at age 65 years through age 37 or 70 years with a history of 3 consecutive normal Pap tests.  HPV screening.** / Every 3 years from ages 72 years through ages 60 to 40 years with a history of 3 consecutive normal Pap tests.  Fecal occult blood test (FOBT) of stool. / Every year beginning at age 21 years and continuing until age 5 years. You may not need to do this test if you get  a colonoscopy every 10 years.  Flexible sigmoidoscopy or colonoscopy.** / Every 5 years for a flexible sigmoidoscopy or every 10 years for a colonoscopy beginning at age 35 years and continuing until age 48 years.  Hepatitis C blood test.** / For all people born from [birth year] through 1965 and any individual with known risks for hepatitis C.  Skin self-exam. / Monthly.  Influenza vaccine. / Every year.  Tetanus, diphtheria, and acellular pertussis (Tdap/Td) vaccine.** / Consult your health care provider. Pregnant women should receive 1 dose of Tdap vaccine during each pregnancy. 1 dose of Td every 10 years.  Varicella vaccine.** / Consult your health care provider. Pregnant females who do not have evidence of immunity should receive the first dose after pregnancy.  Zoster vaccine.** / 1 dose for adults aged 30 years or older.  Measles, mumps, rubella (MMR) vaccine.** / You need at least 1 dose of MMR if you were born in 1957 or later. You may also need a second dose. For females of childbearing age, rubella immunity should be determined. If there is no evidence of immunity, females who are not pregnant should be vaccinated. If there is no evidence of immunity, females who are pregnant should delay immunization until after pregnancy.  Pneumococcal 13-valent conjugate (PCV13) vaccine.** / Consult your health care provider.  Pneumococcal polysaccharide (PPSV23) vaccine.** / 1 to 2 doses if you smoke cigarettes or if you have certain conditions.  Meningococcal vaccine.** /  Consult your health care provider.  Hepatitis A vaccine.** / Consult your health care provider.  Hepatitis B vaccine.** / Consult your health care provider.  Haemophilus influenzae type b (Hib) vaccine.** / Consult your health care provider. Ages 64 years and over  Blood pressure check.** / Every year.  Lipid and cholesterol check.** / Every 5 years beginning at age 23 years.  Lung cancer screening. / Every year if you  are aged 16-80 years and have a 30-pack-year history of smoking and currently smoke or have quit within the past 15 years. Yearly screening is stopped once you have quit smoking for at least 15 years or develop a health problem that would prevent you from having lung cancer treatment.  Clinical breast exam.** / Every year after age 74 years.  BRCA-related cancer risk assessment.** / For women who have family members with a BRCA-related cancer (breast, ovarian, tubal, or peritoneal cancers).  Mammogram.** / Every year beginning at age 44 years and continuing for as long as you are in good health. Consult with your health care provider.  Pap test.** / Every 3 years starting at age 58 years through age 22 or 39 years with 3 consecutive normal Pap tests. Testing can be stopped between 65 and 70 years with 3 consecutive normal Pap tests and no abnormal Pap or HPV tests in the past 10 years.  HPV screening.** / Every 3 years from ages 64 years through ages 70 or 61 years with a history of 3 consecutive normal Pap tests. Testing can be stopped between 65 and 70 years with 3 consecutive normal Pap tests and no abnormal Pap or HPV tests in the past 10 years.  Fecal occult blood test (FOBT) of stool. / Every year beginning at age 40 years and continuing until age 27 years. You may not need to do this test if you get a colonoscopy every 10 years.  Flexible sigmoidoscopy or colonoscopy.** / Every 5 years for a flexible sigmoidoscopy or every 10 years for a colonoscopy beginning at age 7 years and continuing until age 32 years.  Hepatitis C blood test.** / For all people born from [birth year] through 1967 and any individual with known risks for hepatitis C.  Osteoporosis screening.** / A one-time screening for women ages 30 years and over and women at risk for fractures or osteoporosis.  Skin self-exam. / Monthly.  Influenza vaccine. / Every year.  Tetanus, diphtheria, and acellular pertussis (Tdap/Td)  vaccine.** / 1 dose of Td every 10 years.  Varicella vaccine.** / Consult your health care provider.  Zoster vaccine.** / 1 dose for adults aged 35 years or older.  Pneumococcal 13-valent conjugate (PCV13) vaccine.** / Consult your health care provider.  Pneumococcal polysaccharide (PPSV23) vaccine.** / 1 dose for all adults aged 46 years and older.  Meningococcal vaccine.** / Consult your health care provider.  Hepatitis A vaccine.** / Consult your health care provider.  Hepatitis B vaccine.** / Consult your health care provider.  Haemophilus influenzae type b (Hib) vaccine.** / Consult your health care provider. ** Family history and personal history of risk and conditions may change your health care provider's recommendations.   This information is not intended to replace advice given to you by your health care provider. Make sure you discuss any questions you have with your health care provider.   Document Released: 10/20/2001 Document Revised: 09/14/2014 Document Reviewed: 01/19/2011 Elsevier Interactive Patient Education [employer].

## 2015-10-30 NOTE — Progress Notes (Signed)
Subjective:  Patient ID: Julie Rivas, female    DOB: 1956-05-18  Age: 60 y.o. MRN: [id number]  CC: Asthma; Allergic Rhinitis ; Annual Exam; and Back Pain  NEW TO ME  HPI LACHAKA MASSER presents for a complete physical but she also has complaints. She complains of chronic, intermittent wheezing and frequent exacerbations of asthma. She also complains of chronic nasal congestion, runny nose, sneezing, and postnasal drip and has a history of frequent sinus infections. She does not currently have facial pain and says the nasal phlegm is clear and does not appear to be infected. For the wheezing she has been using albuterol inhaler pretty frequently.  She also complains of about a 2 month history of vaginal burning and dyspareunia.  She has a remote history of sciatica previously evaluated and treated by a pain specialist. She complains of recurrent episodes of right buttock pain that radiates down her right lower extremity and intermittent episodes of right heel pain. She denies numbness, weakness, or tingling in her lower extremities. She is not currently taking anything for pain.  History Kersey has a past medical history of Asthma and Arthritis.   She has past surgical history that includes Ectopic pregnancy surgery.   Her family history includes Arthritis in her mother; Cancer in her father; Diabetes in her sister; Heart failure in her mother; Hypertension in her father. There is no history of Colon cancer, Esophageal cancer, Stomach cancer, Rectal cancer, Hyperlipidemia, Heart disease, Early death, or Depression.She reports that she has never smoked. She has never used smokeless tobacco. She reports that she does not drink alcohol or use illicit drugs.  Outpatient Prescriptions Prior to Visit  Medication Sig Dispense Refill  . Multiple Vitamins-Minerals (MULTIVITAMIN PO) Take by mouth daily.    . carisoprodol (SOMA) 350 MG tablet Take 350 mg by mouth 2 (two) times daily as needed  for muscle spasms. Back strain    . ezetimibe (ZETIA) 10 MG tablet Take 10 mg by mouth daily.    Marland Kitchen gabapentin (NEURONTIN) 300 MG capsule Take 300 mg by mouth at bedtime as needed.    Marland Kitchen HYDROcodone-acetaminophen (NORCO) 10-325 MG per tablet Take 1 tablet by mouth every 6 (six) hours as needed.    Marland Kitchen ibuprofen (ADVIL,MOTRIN) 800 MG tablet Take 800 mg by mouth every 8 (eight) hours as needed.    . montelukast (SINGULAIR) 10 MG tablet Take 10 mg by mouth at bedtime.      . naproxen (NAPROSYN) 500 MG tablet Take 500 mg by mouth 2 (two) times daily with a meal.    . potassium chloride (K-DUR) 10 MEQ tablet Take 10 mEq by mouth daily.    . tizanidine (ZANAFLEX) 2 MG capsule Take 2 mg by mouth 4 (four) times daily as needed for muscle spasms.    . traMADol (ULTRAM) 50 MG tablet Take 50 mg by mouth every 6 (six) hours as needed.    . triamterene-hydrochlorothiazide (DYAZIDE) 37.5-25 MG per capsule Take 1 capsule by mouth daily.    . benzonatate (TESSALON) 100 MG capsule Take 100 mg by mouth 2 (two) times daily as needed.       No facility-administered medications prior to visit.    ROS Review of Systems  Constitutional: Negative.  Negative for fever, chills, diaphoresis, activity change, appetite change, fatigue and unexpected weight change.  HENT: Positive for congestion, postnasal drip, rhinorrhea and sneezing. Negative for dental problem, drooling, ear discharge, ear pain, facial swelling, mouth sores, nosebleeds, sinus pressure, sore  throat, tinnitus, trouble swallowing and voice change.   Eyes: Negative.   Respiratory: Positive for wheezing.   Cardiovascular: Negative.  Negative for chest pain, palpitations and leg swelling.  Gastrointestinal: Negative.  Negative for nausea, vomiting, abdominal pain, diarrhea, constipation and blood in stool.  Endocrine: Negative.   Genitourinary: Positive for vaginal pain and dyspareunia. Negative for dysuria, urgency, frequency, hematuria, flank pain, decreased  urine volume, vaginal bleeding, vaginal discharge, enuresis, difficulty urinating, genital sores, menstrual problem and pelvic pain.       She complains of vaginal itching and discomfort, this has been occurring for about 2 months, she has a lot of discomfort with sexual intercourse, over the last few months she has taken multiple courses of antibiotics.  Musculoskeletal: Positive for back pain.       She has chronic right buttock pain that radiates into her right lower extremity, she was previously seen by a pain management specialist and wants to see somebody different this time.  Skin: Negative.   Allergic/Immunologic: Negative.   Neurological: Negative.  Negative for dizziness, weakness and numbness.  Hematological: Negative.  Negative for adenopathy. Does not bruise/bleed easily.  Psychiatric/Behavioral: Negative.     Objective:  BP 120/86 mmHg  Pulse 82  Temp(Src) 98.9 F (37.2 C) (Oral)  Resp 16  Ht 5\' 2"  (1.575 m)  Wt 144 lb (65.318 kg)  BMI 26.33 kg/m2  SpO2 95%  Physical Exam  Abdominal: Hernia confirmed negative in the right inguinal area and confirmed negative in the left inguinal area.  Genitourinary: Rectum normal, vagina normal and uterus normal. Rectal exam shows no external hemorrhoid, no internal hemorrhoid, no fissure, no mass, no tenderness and anal tone normal. Guaiac negative stool. No breast swelling, tenderness, discharge or bleeding. Pelvic exam was performed with patient supine. No labial fusion. There is no rash, tenderness, lesion or injury on the right labia. There is no rash, tenderness, lesion or injury on the left labia. Uterus is not deviated, not enlarged, not fixed and not tender. Cervix exhibits no motion tenderness, no discharge and no friability. Right adnexum displays no mass, no tenderness and no fullness. Left adnexum displays no mass, no tenderness and no fullness. No erythema, tenderness or bleeding in the vagina. No foreign body around the vagina.  No signs of injury around the vagina. No vaginal discharge found.  She has mild vaginal atrophy but no lesions.  Lymphadenopathy:       Right: No inguinal adenopathy present.       Left: No inguinal adenopathy present.    Lab Results  Component Value Date   WBC 6.8 10/30/2015   HGB 14.1 10/30/2015   HCT 41.7 10/30/2015   PLT 261.0 10/30/2015   GLUCOSE 100* 10/30/2015   CHOL 292* 10/30/2015   TRIG 93.0 10/30/2015   HDL 57.50 10/30/2015   LDLCALC 216* 10/30/2015   ALT 15 10/30/2015   AST 15 10/30/2015   NA 142 10/30/2015   K 3.7 10/30/2015   CL 104 10/30/2015   CREATININE 0.65 10/30/2015   BUN 12 10/30/2015   CO2 32 10/30/2015   TSH 0.70 10/30/2015   HGBA1C 6.0 10/30/2015    Assessment & Plan:   Jennye was seen today for asthma, allergic rhinitis , annual exam and back pain.  Diagnoses and all orders for this visit:  Allergic rhinitis due to pollen- will start treating this with a steroid nasal spray and will restart Singulair -     montelukast (SINGULAIR) 10 MG tablet; Take 1 tablet (  10 mg total) by mouth at bedtime. -     fluticasone (FLONASE) 50 MCG/ACT nasal spray; Place 2 sprays into both nostrils daily.  Asthma, moderate persistent, uncomplicated- to prevent ongoing symptoms and frequent exacerbations I have asked her to start an inhaled corticosteroid in addition to the bronchodilators. Will also continue Singulair. -     montelukast (SINGULAIR) 10 MG tablet; Take 1 tablet (10 mg total) by mouth at bedtime. -     fluticasone furoate-vilanterol (BREO ELLIPTA) 200-25 MCG/INH AEPB; Inhale 1 puff into the lungs daily.  Routine general medical examination at a health care facility- she refused a flu vaccine, she was given a Pneumovax since she has a history of asthma, exam completed, labs ordered and reviewed, mammogram and colonoscopy are up-to-date, Pap smear completed today, patient education material was given. -     Lipid panel; Future -     Comprehensive metabolic  panel; Future -     CBC with Differential/Platelet; Future -     TSH; Future -     Urinalysis, Routine w reflex microscopic (not at [facility]); Future -     Hemoglobin A1c; Future -     Hepatitis C antibody; Future -     HIV antibody; Future -     Cancel: Cytology - PAP  Vaginal atrophy -     estradiol (ESTRACE VAGINAL) 0.1 MG/GM vaginal cream; Place 1 Applicatorful vaginally at bedtime.  Sciatica of right side -     gabapentin (NEURONTIN) 300 MG capsule; Take 1 capsule (300 mg total) by mouth at bedtime. -     Ambulatory referral to Sports Medicine  Vaginitis and vulvovaginitis- since she has recently taken several courses of antibiotics I ordered a wet prep to screen for vaginal yeast infection or bacterial vaginosis. -     WET PREP FOR [place], YEAST, CLUE; Future -     Cancel: Cytology - PAP  Need for 23-polyvalent pneumococcal polysaccharide vaccine -     Pneumococcal polysaccharide vaccine 23-valent greater than or equal to 2yo subcutaneous/IM   I have discontinued Ms. Russomanno's benzonatate, carisoprodol, ezetimibe, naproxen, HYDROcodone-acetaminophen, ibuprofen, triamterene-hydrochlorothiazide, traMADol, tizanidine, and potassium chloride. I have also changed her montelukast and gabapentin. Additionally, I am having her start on fluticasone, estradiol, fluticasone furoate-vilanterol, and atorvastatin. Lastly, I am having her maintain her Multiple Vitamins-Minerals (MULTIVITAMIN PO).  Meds ordered this encounter  Medications  . montelukast (SINGULAIR) 10 MG tablet    Sig: Take 1 tablet (10 mg total) by mouth at bedtime.    Dispense:  90 tablet    Refill:  3  . gabapentin (NEURONTIN) 300 MG capsule    Sig: Take 1 capsule (300 mg total) by mouth at bedtime.    Dispense:  90 capsule    Refill:  3  . fluticasone (FLONASE) 50 MCG/ACT nasal spray    Sig: Place 2 sprays into both nostrils daily.    Dispense:  16 g    Refill:  11  . estradiol (ESTRACE VAGINAL) 0.1 MG/GM vaginal cream     Sig: Place 1 Applicatorful vaginally at bedtime.    Dispense:  42.5 g    Refill:  3  . fluticasone furoate-vilanterol (BREO ELLIPTA) 200-25 MCG/INH AEPB    Sig: Inhale 1 puff into the lungs daily.    Dispense:  90 each    Refill:  3  . atorvastatin (LIPITOR) 40 MG tablet    Sig: Take 1 tablet (40 mg total) by mouth daily.    Dispense:  90  tablet    Refill:  3     Follow-up: Return in about 4 months (around 02/27/2016).  Scarlette Calico, MD

## 2015-10-30 NOTE — Telephone Encounter (Signed)
Order was entered for soltis for CP afirm.  Needs to be changed to order under [facility].  Please call back to get number to enter into epic.

## 2015-10-30 NOTE — Telephone Encounter (Signed)
Spoke with lab at [facility]. Wet mount was sent over to there lab in error and they do not process these. Specimen was dry when it was received. Saline was put in tube upon obtaining. Nurse manager was informed of this error as well as PCP and it will be taken care of. PCP will wait on Pap to come back to get further results

## 2015-10-30 NOTE — Progress Notes (Signed)
Pre visit review using our clinic review tool, if applicable. No additional management support is needed unless otherwise documented below in the visit note. 

## 2015-10-31 ENCOUNTER — Encounter: Payer: Self-pay | Admitting: Internal Medicine

## 2015-10-31 DIAGNOSIS — E785 Hyperlipidemia, unspecified: Secondary | ICD-10-CM | POA: Insufficient documentation

## 2015-10-31 LAB — HIV ANTIBODY (ROUTINE TESTING W REFLEX): HIV 1&2 Ab, 4th Generation: NONREACTIVE

## 2015-10-31 LAB — CYTOLOGY - PAP

## 2015-10-31 MED ORDER — ATORVASTATIN CALCIUM 40 MG PO TABS: 40.0000 mg | ORAL_TABLET | Freq: Every day | ORAL | Status: DC

## 2015-10-31 NOTE — Addendum Note (Signed)
Addended by: Janith Lima on: 10/31/2015 04:57 PM   Modules accepted: Miquel Dunn

## 2015-10-31 NOTE — Assessment & Plan Note (Signed)
Her LDL is very high. Will treat this with a statin to reduce her risk of heart attack or stroke.

## 2015-11-05 ENCOUNTER — Telehealth: Payer: Self-pay | Admitting: Internal Medicine

## 2015-11-05 ENCOUNTER — Other Ambulatory Visit: Payer: Self-pay | Admitting: Internal Medicine

## 2015-11-05 DIAGNOSIS — E785 Hyperlipidemia, unspecified: Secondary | ICD-10-CM

## 2015-11-05 MED ORDER — EZETIMIBE 10 MG PO TABS: 10.0000 mg | ORAL_TABLET | Freq: Every day | ORAL | Status: DC

## 2015-11-05 NOTE — Telephone Encounter (Signed)
Rec'd from [place] forward 7 pages to Dr. Ronnald Ramp

## 2015-11-08 ENCOUNTER — Telehealth: Payer: Self-pay | Admitting: Internal Medicine

## 2015-11-08 NOTE — Telephone Encounter (Signed)
Patient states she can not afford copay for zetia.  Patient either needs copay card or a different script of something cheaper sent to her pharmacy.

## 2015-11-10 NOTE — Telephone Encounter (Signed)
Will she take a generic statin?

## 2015-11-11 ENCOUNTER — Telehealth: Payer: Self-pay | Admitting: Internal Medicine

## 2015-11-11 NOTE — Telephone Encounter (Signed)
Rec'd from [place] forward 6 pages to [place]

## 2015-11-11 NOTE — Telephone Encounter (Signed)
Pt states that is fine.

## 2015-11-12 ENCOUNTER — Encounter: Payer: Self-pay | Admitting: Internal Medicine

## 2015-11-12 ENCOUNTER — Other Ambulatory Visit: Payer: Self-pay

## 2015-11-12 DIAGNOSIS — E785 Hyperlipidemia, unspecified: Secondary | ICD-10-CM

## 2015-11-12 MED ORDER — ATORVASTATIN CALCIUM 40 MG PO TABS: 40.0000 mg | ORAL_TABLET | Freq: Every day | ORAL | Status: DC

## 2015-11-12 NOTE — Telephone Encounter (Signed)
escribed to pharmacy on file

## 2015-11-12 NOTE — Telephone Encounter (Signed)
Start atorvastatin

## 2015-11-13 ENCOUNTER — Telehealth: Payer: Self-pay | Admitting: Internal Medicine

## 2015-11-13 ENCOUNTER — Telehealth: Payer: Self-pay

## 2015-11-13 ENCOUNTER — Other Ambulatory Visit: Payer: Self-pay | Admitting: Internal Medicine

## 2015-11-13 DIAGNOSIS — E785 Hyperlipidemia, unspecified: Secondary | ICD-10-CM

## 2015-11-13 MED ORDER — EZETIMIBE-SIMVASTATIN 10-20 MG PO TABS: 1.0000 | ORAL_TABLET | Freq: Every day | ORAL | Status: DC

## 2015-11-13 MED ORDER — PITAVASTATIN CALCIUM 2 MG PO TABS: 1.0000 | ORAL_TABLET | Freq: Every day | ORAL | Status: DC

## 2015-11-13 NOTE — Telephone Encounter (Signed)
immuinzation chart updated

## 2015-11-13 NOTE — Telephone Encounter (Signed)
changed

## 2015-11-13 NOTE — Telephone Encounter (Signed)
PA requested for Livalo, covered alternative is Vytorin. Please advise, change to Vytorin okay?

## 2015-11-13 NOTE — Telephone Encounter (Signed)
Pt left this message on the appointment request on mychart   "I had a shingles vaccine done in July or August of 2015 at the [facility] on [address] here in [place]"

## 2015-11-18 ENCOUNTER — Other Ambulatory Visit: Payer: Self-pay | Admitting: Internal Medicine

## 2015-11-18 DIAGNOSIS — E785 Hyperlipidemia, unspecified: Secondary | ICD-10-CM

## 2015-11-18 MED ORDER — SIMVASTATIN 20 MG PO TABS
20.0000 mg | ORAL_TABLET | Freq: Every day | ORAL | Status: DC
Start: 2015-11-18 — End: 2017-02-18

## 2015-12-31 ENCOUNTER — Encounter: Payer: Self-pay | Admitting: Internal Medicine

## 2015-12-31 ENCOUNTER — Ambulatory Visit (INDEPENDENT_AMBULATORY_CARE_PROVIDER_SITE_OTHER): Payer: BC Managed Care – PPO | Admitting: Internal Medicine

## 2015-12-31 ENCOUNTER — Other Ambulatory Visit (INDEPENDENT_AMBULATORY_CARE_PROVIDER_SITE_OTHER): Payer: BC Managed Care – PPO

## 2015-12-31 VITALS — BP 102/80 | HR 89 | Temp 98.4°F | Resp 16 | Ht 62.0 in | Wt 142.0 lb

## 2015-12-31 DIAGNOSIS — J301 Allergic rhinitis due to pollen: Secondary | ICD-10-CM

## 2015-12-31 DIAGNOSIS — R233 Spontaneous ecchymoses: Secondary | ICD-10-CM | POA: Insufficient documentation

## 2015-12-31 DIAGNOSIS — R238 Other skin changes: Secondary | ICD-10-CM | POA: Insufficient documentation

## 2015-12-31 DIAGNOSIS — H8303 Labyrinthitis, bilateral: Secondary | ICD-10-CM | POA: Diagnosis not present

## 2015-12-31 DIAGNOSIS — H8309 Labyrinthitis, unspecified ear: Secondary | ICD-10-CM | POA: Insufficient documentation

## 2015-12-31 DIAGNOSIS — J0100 Acute maxillary sinusitis, unspecified: Secondary | ICD-10-CM | POA: Diagnosis not present

## 2015-12-31 LAB — CBC WITH DIFFERENTIAL/PLATELET
BASOS PCT: 0.2 % (ref 0.0–3.0)
Basophils Absolute: 0 10*3/uL (ref 0.0–0.1)
Eosinophils Absolute: 0.1 10*3/uL (ref 0.0–0.7)
Eosinophils Relative: 1.1 % (ref 0.0–5.0)
HCT: 45.5 % (ref 36.0–46.0)
Hemoglobin: 15.5 g/dL — ABNORMAL HIGH (ref 12.0–15.0)
LYMPHS ABS: 2 10*3/uL (ref 0.7–4.0)
Lymphocytes Relative: 26.2 % (ref 12.0–46.0)
MCHC: 34.1 g/dL (ref 30.0–36.0)
MCV: 92.7 fl (ref 78.0–100.0)
MONO ABS: 0.4 10*3/uL (ref 0.1–1.0)
Monocytes Relative: 5.3 % (ref 3.0–12.0)
NEUTROS PCT: 67.2 % (ref 43.0–77.0)
Neutro Abs: 5.1 10*3/uL (ref 1.4–7.7)
Platelets: 250 10*3/uL (ref 150.0–400.0)
RBC: 4.91 Mil/uL (ref 3.87–5.11)
RDW: 13.3 % (ref 11.5–15.5)
WBC: 7.6 10*3/uL (ref 4.0–10.5)

## 2015-12-31 LAB — COMPREHENSIVE METABOLIC PANEL
ALBUMIN: 4.3 g/dL (ref 3.5–5.2)
ALK PHOS: 89 U/L (ref 39–117)
ALT: 18 U/L (ref 0–35)
AST: 16 U/L (ref 0–37)
BUN: 16 mg/dL (ref 6–23)
CO2: 31 mEq/L (ref 19–32)
Calcium: 9.9 mg/dL (ref 8.4–10.5)
Chloride: 101 mEq/L (ref 96–112)
Creatinine, Ser: 0.79 mg/dL (ref 0.40–1.20)
GFR: 94.87 mL/min (ref >60.00)
Glucose, Bld: 118 mg/dL — ABNORMAL HIGH (ref 70–99)
Potassium: 3.5 mEq/L (ref 3.5–5.1)
SODIUM: 139 meq/L (ref 135–145)
TOTAL PROTEIN: 7.6 g/dL (ref 6.0–8.3)
Total Bilirubin: 0.6 mg/dL (ref 0.2–1.2)

## 2015-12-31 LAB — PROTIME-INR
INR: 1.1 ratio — AB (ref 0.8–1.0)
PROTHROMBIN TIME: 11.2 s (ref 9.6–13.1)

## 2015-12-31 LAB — APTT: aPTT: 29 s (ref 23.4–32.7)

## 2015-12-31 MED ORDER — AMOXICILLIN-POT CLAVULANATE 875-125 MG PO TABS: 1.0000 | ORAL_TABLET | Freq: Two times a day (BID) | ORAL | Status: AC

## 2015-12-31 MED ORDER — METHYLPREDNISOLONE ACETATE 80 MG/ML IJ SUSP
120.0000 mg | Freq: Once | INTRAMUSCULAR | Status: AC
Start: 2015-12-31 — End: 2015-12-31
  Administered 2015-12-31: 120 mg via INTRAMUSCULAR

## 2015-12-31 NOTE — Patient Instructions (Signed)

## 2015-12-31 NOTE — Progress Notes (Signed)
Pre visit review using our clinic review tool, if applicable. No additional management support is needed unless otherwise documented below in the visit note. 

## 2016-01-01 NOTE — Progress Notes (Signed)
Subjective:  Patient ID: Julie Rivas, female    DOB: 1956/07/08  Age: 60 y.o. MRN: [id number]  CC: Sinusitis   HPI Julie Rivas presents for complaints of sinusitis, vertigo, and dizziness. She has had a long history of vertigo and dizziness and says about 2-3 weeks ago the symptoms worsened. Since that time she has developed thick nasal yellow phlegm with sore throat and bilateral ear discomfort. She complains of left-sided facial pain as well.  In addition, she complains of easy bruising over the last few months. She takes an aspirin a day. She currently has a bruise on her left lower abdomen that is not bothering her. She has noticed bruises elsewhere that come and go. She has not noticed any bleeding from her nose, gums, urine, or stool.  Outpatient Prescriptions Prior to Visit  Medication Sig Dispense Refill  . estradiol (ESTRACE VAGINAL) 0.1 MG/GM vaginal cream Place 1 Applicatorful vaginally at bedtime. 42.5 g 3  . fluticasone (FLONASE) 50 MCG/ACT nasal spray Place 2 sprays into both nostrils daily. 16 g 11  . fluticasone furoate-vilanterol (BREO ELLIPTA) 200-25 MCG/INH AEPB Inhale 1 puff into the lungs daily. 90 each 3  . gabapentin (NEURONTIN) 300 MG capsule Take 1 capsule (300 mg total) by mouth at bedtime. 90 capsule 3  . montelukast (SINGULAIR) 10 MG tablet Take 1 tablet (10 mg total) by mouth at bedtime. 90 tablet 3  . Multiple Vitamins-Minerals (MULTIVITAMIN PO) Take by mouth daily.    . simvastatin (ZOCOR) 20 MG tablet Take 1 tablet (20 mg total) by mouth at bedtime. 90 tablet 3   No facility-administered medications prior to visit.    ROS Review of Systems  Constitutional: Negative.  Negative for fever, chills, diaphoresis, appetite change and fatigue.  HENT: Positive for congestion, postnasal drip, rhinorrhea, sinus pressure and sore throat. Negative for ear pain, facial swelling, nosebleeds, trouble swallowing and voice change.   Eyes: Negative.  Negative for  visual disturbance.  Respiratory: Negative.  Negative for cough, choking, chest tightness, shortness of breath and stridor.   Cardiovascular: Negative.  Negative for chest pain, palpitations and leg swelling.  Gastrointestinal: Negative.  Negative for nausea, vomiting, abdominal pain, diarrhea, blood in stool and anal bleeding.  Endocrine: Negative.   Genitourinary: Negative.  Negative for dysuria, hematuria, flank pain and difficulty urinating.  Musculoskeletal: Negative.  Negative for myalgias, arthralgias and neck pain.  Skin: Negative.  Negative for pallor and rash.  Allergic/Immunologic: Negative.   Neurological: Positive for dizziness. Negative for tremors, syncope, facial asymmetry, light-headedness and numbness.  Hematological: Negative for adenopathy. Bruises/bleeds easily.  Psychiatric/Behavioral: Negative.     Objective:  BP 102/80 mmHg  Pulse 89  Temp(Src) 98.4 F (36.9 C) (Oral)  Ht 5\' 2"  (1.575 m)  Wt 142 lb (64.411 kg)  BMI 25.97 kg/m2  SpO2 97%  BP Readings from Last 3 Encounters:  12/31/15 102/80  10/30/15 120/86  09/28/13 138/53    Wt Readings from Last 3 Encounters:  12/31/15 142 lb (64.411 kg)  10/30/15 144 lb (65.318 kg)  09/28/13 135 lb (61.236 kg)    Physical Exam  Constitutional: She is oriented to person, place, and time. No distress.  HENT:  Right Ear: Hearing, tympanic membrane, external ear and ear canal normal. No middle ear effusion.  Left Ear: Hearing, tympanic membrane, external ear and ear canal normal.  No middle ear effusion.  Nose: No mucosal edema or rhinorrhea. Right sinus exhibits no maxillary sinus tenderness and no frontal sinus tenderness.  Left sinus exhibits maxillary sinus tenderness. Left sinus exhibits no frontal sinus tenderness.  Mouth/Throat: Oropharynx is clear and moist and mucous membranes are normal. Mucous membranes are not pale, not dry and not cyanotic. No oral lesions. No trismus in the jaw. No uvula swelling. No  oropharyngeal exudate, posterior oropharyngeal edema, posterior oropharyngeal erythema or tonsillar abscesses.  Eyes: Conjunctivae are normal. Right eye exhibits no discharge. Left eye exhibits no discharge. No scleral icterus.  Neck: Normal range of motion. Neck supple. No JVD present. No tracheal deviation present. No thyromegaly present.  Cardiovascular: Normal rate, regular rhythm, normal heart sounds and intact distal pulses.  Exam reveals no gallop and no friction rub.   No murmur heard. Pulmonary/Chest: Effort normal and breath sounds normal. No stridor. No respiratory distress. She has no wheezes. She has no rales. She exhibits no tenderness.  Abdominal: Soft. Bowel sounds are normal. She exhibits no distension and no mass. There is no tenderness. There is no rebound and no guarding.  Musculoskeletal: Normal range of motion. She exhibits no edema or tenderness.  Lymphadenopathy:    She has no cervical adenopathy.  Neurological: She is oriented to person, place, and time.  Skin: Skin is warm and dry. Bruising noted. No ecchymosis, no petechiae and no rash noted. She is not diaphoretic. No erythema. No pallor.     Vitals reviewed.   Lab Results  Component Value Date   WBC 7.6 12/31/2015   HGB 15.5* 12/31/2015   HCT 45.5 12/31/2015   PLT 250.0 12/31/2015   GLUCOSE 118* 12/31/2015   CHOL 292* 10/30/2015   TRIG 93.0 10/30/2015   HDL 57.50 10/30/2015   LDLCALC 216* 10/30/2015   ALT 18 12/31/2015   AST 16 12/31/2015   NA 139 12/31/2015   K 3.5 12/31/2015   CL 101 12/31/2015   CREATININE 0.79 12/31/2015   BUN 16 12/31/2015   CO2 31 12/31/2015   TSH 0.70 10/30/2015   PSA WNL 10/30/2015   INR 1.1* 12/31/2015   HGBA1C 6.0 10/30/2015    Mm Digital Screening Bilateral  10/30/2015  CLINICAL DATA:  Screening. EXAM: DIGITAL SCREENING BILATERAL MAMMOGRAM WITH CAD COMPARISON:  Previous exam(s). ACR Breast Density Category b: There are scattered areas of fibroglandular density.  FINDINGS: There are no findings suspicious for malignancy. Images were processed with CAD. IMPRESSION: No mammographic evidence of malignancy. A result letter of this screening mammogram will be mailed directly to the patient. RECOMMENDATION: Screening mammogram in one year. (Code:SM-B-01Y) BI-RADS CATEGORY  1: Negative. Electronically Signed   By: Margarette Canada M.D.   On: 10/30/2015 16:41    Assessment & Plan:   Infiniti was seen today for sinusitis.  Diagnoses and all orders for this visit:  Non-seasonal allergic rhinitis due to pollen- she is having a flareup of her symptoms so I gave her an injection of Depo-Medrol  Labyrinthitis, acute viral, bilateral- she is already taking an antihistamine and decongestant as well as a steroid nasal spray, will also treat her symptoms with a Depo-Medrol injection. -     methylPREDNISolone acetate (DEPO-MEDROL) injection 120 mg; Inject 1.5 mLs (120 mg total) into the muscle once.  Acute maxillary sinusitis, recurrence not specified- will treat the sinus infection with Augmentin. -     amoxicillin-clavulanate (AUGMENTIN) 875-125 MG tablet; Take 1 tablet by mouth 2 (two) times daily.  Easy bruising- she has one small, uncomplicated bruise, her platelets are normal, her coags were all normal, her liver function is normal, this is most likely related  to the aspirin therapy. She is not very symptomatic with relation to this and she would benefit from continued aspirin therapy so she is willing to continue. Have asked her to avoid even the slightest trauma that could cause bruising. -     Comprehensive metabolic panel; Future -     CBC with Differential/Platelet; Future -     Protime-INR; Future -     APTT; Future   I am having Ms. Sorbo start on amoxicillin-clavulanate. I am also having her maintain her Multiple Vitamins-Minerals (MULTIVITAMIN PO), montelukast, gabapentin, fluticasone, estradiol, fluticasone furoate-vilanterol, and simvastatin. We administered  methylPREDNISolone acetate.  Meds ordered this encounter  Medications  . amoxicillin-clavulanate (AUGMENTIN) 875-125 MG tablet    Sig: Take 1 tablet by mouth 2 (two) times daily.    Dispense:  28 tablet    Refill:  0  . methylPREDNISolone acetate (DEPO-MEDROL) injection 120 mg    Sig:      Follow-up: Return in about 3 weeks (around 01/21/2016).  Scarlette Calico, MD

## 2016-02-27 ENCOUNTER — Encounter: Payer: Self-pay | Admitting: Podiatry

## 2016-02-27 ENCOUNTER — Ambulatory Visit (INDEPENDENT_AMBULATORY_CARE_PROVIDER_SITE_OTHER): Payer: BC Managed Care – PPO | Admitting: Podiatry

## 2016-02-27 ENCOUNTER — Other Ambulatory Visit: Payer: Self-pay

## 2016-02-27 DIAGNOSIS — M216X9 Other acquired deformities of unspecified foot: Secondary | ICD-10-CM

## 2016-02-27 DIAGNOSIS — M216X2 Other acquired deformities of left foot: Secondary | ICD-10-CM

## 2016-02-27 DIAGNOSIS — M722 Plantar fascial fibromatosis: Secondary | ICD-10-CM

## 2016-02-27 DIAGNOSIS — M21969 Unspecified acquired deformity of unspecified lower leg: Secondary | ICD-10-CM | POA: Diagnosis not present

## 2016-02-27 DIAGNOSIS — M216X1 Other acquired deformities of right foot: Secondary | ICD-10-CM

## 2016-02-27 MED ORDER — METHYLPREDNISOLONE 4 MG PO TBPK: ORAL_TABLET | ORAL | Status: DC

## 2016-02-27 NOTE — Patient Instructions (Signed)
Seen for bilateral foot pain. Possible plantar fasciitis. Night Splint dispensed. Both feet casted for orthotics. Medrol Dose pack prescribed. Return in 2 weeks.

## 2016-02-27 NOTE — Progress Notes (Signed)
SUBJECTIVE: 60 y.o. year old female presents complaining of bilateral foot pain for 3-4 months. Was in school system and was not able to get in. Patient points proximal 1/2 of plantar aspect of foot R>L being the source of pain. Pain starts in the morning and continues off and on through out the day. Patient wears dress shoes at work.  She is also having problem with Sciatica. Patient is wearing hard shell Custom orthotics ([employer]) that were refurbished in 2012.   OBJECTIVE: DERMATOLOGIC EXAMINATION: Normal findings.  VASCULAR EXAMINATION OF LOWER LIMBS: Pedal pulses: All pedal pulses are palpable with normal pulsation.  No edema or erythema noted. No change in temperature noted.  NEUROLOGIC EXAMINATION OF THE LOWER LIMBS: All epicritic and tactile sensations grossly intact. MUSCULOSKELETAL EXAMINATION: Positive for hypermobile first ray bilateral R>L. Pain at plantar heel pad R>L. STJ hyperpronation bilateral R>L.   RADIOGRAPHIC STUDIES: AP View:  Rectus foot with mild hallux valgus, fibular sesamoid position at 3 bilateral, increased lateral deviation angle of CCJ bilateral.  Contracted lesser digits 4th and 5th bilateral.  Lateral view:  Supinated foot with midtarsal sagging bilateral.  Spurs present inferior calcaneal tuberosity bilateral.  Normal CYMA line bilateral. No abnormal acute changes noted.    ASSESSMENT: Plantar fasciitis bilateral. Metatarsal deformity bilateral. Hyperpronation STJ bilateral.   PLAN: Reviewed clinical findings and available treatment options. Night Splint dispensed with instructions. Rx Medrol dose pack. If this doesn't help, will give cortisone injection.  Both feet casted for orthotics. Return in 2 weeks.

## 2016-03-17 ENCOUNTER — Ambulatory Visit: Payer: BC Managed Care – PPO | Admitting: Podiatry

## 2016-03-24 ENCOUNTER — Encounter: Payer: Self-pay | Admitting: Podiatry

## 2016-03-24 ENCOUNTER — Ambulatory Visit (INDEPENDENT_AMBULATORY_CARE_PROVIDER_SITE_OTHER): Payer: BC Managed Care – PPO | Admitting: Podiatry

## 2016-03-24 VITALS — BP 116/82 | HR 89

## 2016-03-24 DIAGNOSIS — M722 Plantar fascial fibromatosis: Secondary | ICD-10-CM

## 2016-03-24 DIAGNOSIS — M21969 Unspecified acquired deformity of unspecified lower leg: Secondary | ICD-10-CM | POA: Diagnosis not present

## 2016-03-24 DIAGNOSIS — M216X2 Other acquired deformities of left foot: Secondary | ICD-10-CM

## 2016-03-24 DIAGNOSIS — M216X1 Other acquired deformities of right foot: Secondary | ICD-10-CM

## 2016-03-24 MED ORDER — INDOMETHACIN 50 MG PO CAPS: 50.0000 mg | ORAL_CAPSULE | Freq: Three times a day (TID) | ORAL | Status: DC

## 2016-03-24 NOTE — Progress Notes (Signed)
SUBJECTIVE: 60 y.o. year old female presents complaining of pain in both feet.  Left foot hurt at bunion site and right foot hurt at heel. Still using Night Splints. Has taken Indomethacin and did well.   History: Had bilateral foot pain for 6 months. Was in school system and was not able to get in. Patient points proximal 1/2 of plantar aspect of foot R>L being the source of pain. Pain starts in the morning and continues off and on through out the day. Patient wears dress shoes at work.  She is also having problem with Sciatica. Patient is wearing hard shell Custom orthotics ([employer]) that were refurbished in 2012.   OBJECTIVE: DERMATOLOGIC EXAMINATION: Normal findings.  VASCULAR EXAMINATION OF LOWER LIMBS: Pedal pulses: All pedal pulses are palpable with normal pulsation.  No edema or erythema noted. No change in temperature noted.  NEUROLOGIC EXAMINATION OF THE LOWER LIMBS: All epicritic and tactile sensations grossly intact. MUSCULOSKELETAL EXAMINATION: Positive for hypermobile first ray bilateral R>L. Pain at plantar heel pad R>L. STJ hyperpronation bilateral R>L.   ASSESSMENT: Plantar fasciitis bilateral. Metatarsal deformity bilateral. Hyperpronation STJ bilateral.   PLAN: Reviewed clinical findings and available treatment options. Continue with Night Splint. Indomethacin 50 mg tid dispensed as per request.

## 2016-03-24 NOTE — Patient Instructions (Signed)
Continue with Night Splint. Rx for Indomethacin sent.

## 2016-04-28 ENCOUNTER — Ambulatory Visit: Payer: BC Managed Care – PPO | Admitting: Podiatry

## 2016-07-01 ENCOUNTER — Ambulatory Visit (INDEPENDENT_AMBULATORY_CARE_PROVIDER_SITE_OTHER): Payer: BC Managed Care – PPO | Admitting: Internal Medicine

## 2016-07-01 ENCOUNTER — Encounter: Payer: Self-pay | Admitting: Internal Medicine

## 2016-07-01 VITALS — BP 120/78 | HR 82 | Temp 98.4°F | Resp 16 | Ht 62.0 in | Wt 141.0 lb

## 2016-07-01 DIAGNOSIS — R200 Anesthesia of skin: Secondary | ICD-10-CM | POA: Diagnosis not present

## 2016-07-01 DIAGNOSIS — H6983 Other specified disorders of Eustachian tube, bilateral: Secondary | ICD-10-CM | POA: Diagnosis not present

## 2016-07-01 DIAGNOSIS — R202 Paresthesia of skin: Secondary | ICD-10-CM | POA: Diagnosis not present

## 2016-07-01 DIAGNOSIS — Z23 Encounter for immunization: Secondary | ICD-10-CM

## 2016-07-01 MED ORDER — METHYLPREDNISOLONE ACETATE 80 MG/ML IJ SUSP
120.0000 mg | Freq: Once | INTRAMUSCULAR | Status: AC
  Administered 2016-07-01: 120 mg via INTRAMUSCULAR

## 2016-07-01 NOTE — Patient Instructions (Signed)

## 2016-07-01 NOTE — Progress Notes (Signed)
Pre visit review using our clinic review tool, if applicable. No additional management support is needed unless otherwise documented below in the visit note. 

## 2016-07-01 NOTE — Progress Notes (Signed)
Subjective:  Patient ID: Julie Rivas, female    DOB: Feb 05, 1956  Age: 60 y.o. MRN: [id number]  CC: Cough and Allergic Rhinitis    HPI CHARINA DICKES presents for a several week history of nasal congestion, sneezing, postnasal drip, and popping in both ears. She denies facial pain, sore throat, fever, chills, cough, or lymphadenopathy. She has a history of nasal allergies that are worsened in the late summer and fall and is been using Flonase nasal spray and over-the-counter antihistamine without much relief from her symptoms.  She also complains of chronic numbness in both hands and is concerned she may have carpal tunnel syndrome.  Outpatient Medications Prior to Visit  Medication Sig Dispense Refill  . estradiol (ESTRACE VAGINAL) 0.1 MG/GM vaginal cream Place 1 Applicatorful vaginally at bedtime. 42.5 g 3  . fluticasone (FLONASE) 50 MCG/ACT nasal spray Place 2 sprays into both nostrils daily. 16 g 11  . fluticasone furoate-vilanterol (BREO ELLIPTA) 200-25 MCG/INH AEPB Inhale 1 puff into the lungs daily. 90 each 3  . gabapentin (NEURONTIN) 300 MG capsule Take 1 capsule (300 mg total) by mouth at bedtime. 90 capsule 3  . indomethacin (INDOCIN) 50 MG capsule Take 1 capsule (50 mg total) by mouth 3 (three) times daily with meals. 90 capsule 4  . montelukast (SINGULAIR) 10 MG tablet Take 1 tablet (10 mg total) by mouth at bedtime. 90 tablet 3  . simvastatin (ZOCOR) 20 MG tablet Take 1 tablet (20 mg total) by mouth at bedtime. 90 tablet 3  . methylPREDNISolone (MEDROL DOSEPAK) 4 MG TBPK tablet As per instruction 21 tablet 0  . Multiple Vitamins-Minerals (MULTIVITAMIN PO) Take by mouth daily.     No facility-administered medications prior to visit.     ROS Review of Systems  Constitutional: Negative for activity change, appetite change, chills, diaphoresis, fatigue, fever and unexpected weight change.  HENT: Positive for congestion, ear pain, postnasal drip and rhinorrhea. Negative  for facial swelling, sinus pressure, sneezing, sore throat, tinnitus and trouble swallowing.   Eyes: Negative.  Negative for photophobia and visual disturbance.  Respiratory: Negative.  Negative for cough, choking, chest tightness, shortness of breath, wheezing and stridor.   Cardiovascular: Negative.  Negative for chest pain, palpitations and leg swelling.  Gastrointestinal: Negative.  Negative for abdominal pain, constipation, diarrhea, nausea and vomiting.  Endocrine: Negative.   Genitourinary: Negative.  Negative for difficulty urinating.  Musculoskeletal: Negative.  Negative for arthralgias, back pain, myalgias and neck pain.  Skin: Negative.  Negative for color change, pallor and rash.  Allergic/Immunologic: Negative.   Neurological: Positive for numbness. Negative for dizziness, weakness and light-headedness.  Hematological: Negative.  Negative for adenopathy. Does not bruise/bleed easily.  Psychiatric/Behavioral: Negative.     Objective:  BP 120/78 (BP Location: Left Arm, Patient Position: Sitting, Cuff Size: Normal)   Pulse 82   Temp 98.4 F (36.9 C) (Oral)   Resp 16   Ht 5\' 2"  (1.575 m)   Wt 141 lb (64 kg)   SpO2 96%   BMI 25.79 kg/m   BP Readings from Last 3 Encounters:  07/01/16 120/78  03/24/16 116/82  12/31/15 102/80    Wt Readings from Last 3 Encounters:  07/01/16 141 lb (64 kg)  12/31/15 142 lb (64.4 kg)  10/30/15 144 lb (65.3 kg)    Physical Exam  Constitutional: She is oriented to person, place, and time. No distress.  HENT:  Right Ear: Hearing, tympanic membrane, external ear and ear canal normal.  Left Ear: Hearing,  tympanic membrane, external ear and ear canal normal.  Nose: Mucosal edema and rhinorrhea present. No nasal deformity. Right sinus exhibits no maxillary sinus tenderness and no frontal sinus tenderness. Left sinus exhibits no maxillary sinus tenderness and no frontal sinus tenderness.  Mouth/Throat: Oropharynx is clear and moist and mucous  membranes are normal. Mucous membranes are not pale, not dry and not cyanotic. No oropharyngeal exudate, posterior oropharyngeal edema, posterior oropharyngeal erythema or tonsillar abscesses.  Eyes: Conjunctivae are normal. Right eye exhibits no discharge. Left eye exhibits no discharge. No scleral icterus.  Neck: Normal range of motion. Neck supple. No JVD present. No tracheal deviation present. No thyromegaly present.  Cardiovascular: Normal rate, regular rhythm, normal heart sounds and intact distal pulses.  Exam reveals no gallop and no friction rub.   No murmur heard. Pulmonary/Chest: Effort normal and breath sounds normal. No stridor. No respiratory distress. She has no wheezes. She has no rales. She exhibits no tenderness.  Abdominal: Soft. Bowel sounds are normal. She exhibits no distension and no mass. There is no tenderness. There is no rebound and no guarding.  Musculoskeletal: Normal range of motion. She exhibits no edema, tenderness or deformity.  Lymphadenopathy:    She has no cervical adenopathy.  Neurological: She is alert and oriented to person, place, and time. She has normal reflexes. She displays normal reflexes. No cranial nerve deficit. She exhibits normal muscle tone. Coordination normal.  Skin: Skin is warm and dry. No rash noted. She is not diaphoretic. No erythema. No pallor.  Psychiatric: She has a normal mood and affect. Her behavior is normal. Judgment and thought content normal.  Vitals reviewed.   Lab Results  Component Value Date   WBC 7.6 12/31/2015   HGB 15.5 (H) 12/31/2015   HCT 45.5 12/31/2015   PLT 250.0 12/31/2015   GLUCOSE 118 (H) 12/31/2015   CHOL 292 (H) 10/30/2015   TRIG 93.0 10/30/2015   HDL 57.50 10/30/2015   LDLCALC 216 (H) 10/30/2015   ALT 18 12/31/2015   AST 16 12/31/2015   NA 139 12/31/2015   K 3.5 12/31/2015   CL 101 12/31/2015   CREATININE 0.79 12/31/2015   BUN 16 12/31/2015   CO2 31 12/31/2015   TSH 0.70 10/30/2015   PSA WNL  10/30/2015   INR 1.1 (H) 12/31/2015   HGBA1C 6.0 10/30/2015    Mm Digital Screening Bilateral  Result Date: 10/30/2015 CLINICAL DATA:  Screening. EXAM: DIGITAL SCREENING BILATERAL MAMMOGRAM WITH CAD COMPARISON:  Previous exam(s). ACR Breast Density Category b: There are scattered areas of fibroglandular density. FINDINGS: There are no findings suspicious for malignancy. Images were processed with CAD. IMPRESSION: No mammographic evidence of malignancy. A result letter of this screening mammogram will be mailed directly to the patient. RECOMMENDATION: Screening mammogram in one year. (Code:SM-B-01Y) BI-RADS CATEGORY  1: Negative. Electronically Signed   By: Margarette Canada M.D.   On: 10/30/2015 16:41    Assessment & Plan:   Aliki was seen today for cough and allergic rhinitis .  Diagnoses and all orders for this visit:  Dysfunction of both eustachian tubes- she is having a flareup of allergic rhinitis with eustachian tube dysfunction, will treat with a Depo-Medrol injection. -     methylPREDNISolone acetate (DEPO-MEDROL) injection 120 mg; Inject 1.5 mLs (120 mg total) into the muscle once.  Need for prophylactic vaccination and inoculation against influenza -     Flu Vaccine QUAD 36+ mos IM  Numbness and tingling in both hands- I've ordered a NCS/EMG  to screen for carpal tunnel syndrome and other causes of numbness in her upper extremities. -     Ambulatory referral to Neurology   I have discontinued Ms. Bryden's Multiple Vitamins-Minerals (MULTIVITAMIN PO) and methylPREDNISolone. I am also having her maintain her montelukast, gabapentin, fluticasone, estradiol, fluticasone furoate-vilanterol, simvastatin, and indomethacin. We administered methylPREDNISolone acetate.  Meds ordered this encounter  Medications  . methylPREDNISolone acetate (DEPO-MEDROL) injection 120 mg     Follow-up: Return in about 4 weeks (around 07/29/2016).  Scarlette Calico, MD

## 2016-07-16 ENCOUNTER — Ambulatory Visit (INDEPENDENT_AMBULATORY_CARE_PROVIDER_SITE_OTHER)
Admission: RE | Admit: 2016-07-16 | Discharge: 2016-07-16 | Disposition: A | Discharge status: Home / Self Care | Payer: BC Managed Care – PPO | Source: Ambulatory Visit | Attending: Internal Medicine | Admitting: Internal Medicine

## 2016-07-16 ENCOUNTER — Encounter: Payer: Self-pay | Admitting: Internal Medicine

## 2016-07-16 ENCOUNTER — Ambulatory Visit (INDEPENDENT_AMBULATORY_CARE_PROVIDER_SITE_OTHER): Payer: BC Managed Care – PPO | Admitting: Internal Medicine

## 2016-07-16 VITALS — BP 124/74 | HR 87 | Temp 98.7°F | Resp 16 | Ht 62.0 in | Wt 137.2 lb

## 2016-07-16 DIAGNOSIS — R51 Headache: Secondary | ICD-10-CM | POA: Diagnosis not present

## 2016-07-16 DIAGNOSIS — R05 Cough: Secondary | ICD-10-CM | POA: Diagnosis not present

## 2016-07-16 DIAGNOSIS — R059 Cough, unspecified: Secondary | ICD-10-CM

## 2016-07-16 DIAGNOSIS — J0100 Acute maxillary sinusitis, unspecified: Secondary | ICD-10-CM

## 2016-07-16 DIAGNOSIS — R519 Headache, unspecified: Secondary | ICD-10-CM

## 2016-07-16 DIAGNOSIS — R202 Paresthesia of skin: Secondary | ICD-10-CM

## 2016-07-16 DIAGNOSIS — R200 Anesthesia of skin: Secondary | ICD-10-CM

## 2016-07-16 MED ORDER — AMOXICILLIN-POT CLAVULANATE 875-125 MG PO TABS: 1.0000 | ORAL_TABLET | Freq: Two times a day (BID) | ORAL | 1 refills | Status: AC

## 2016-07-16 MED ORDER — PROMETHAZINE-DM 6.25-15 MG/5ML PO SYRP: 5.0000 mL | ORAL_SOLUTION | Freq: Four times a day (QID) | ORAL | 0 refills | Status: DC | PRN

## 2016-07-16 NOTE — Patient Instructions (Signed)

## 2016-07-16 NOTE — Progress Notes (Signed)
Subjective:  Patient ID: Julie Rivas, female    DOB: 1956/04/13  Age: 60 y.o. MRN: [id number]  CC: Headache   HPI Julie Rivas presents for ongoing symptoms and feeling poorly. She complains of worsening left-sided headache with nausea but no vomiting. When I saw her she had numbness and tingling in her hands and I referred her to be screened for carpal tunnel syndrome but she elected not to pursue that. Today the numbness and tingling in the hands continues and now it has spread to her right lower extremity. She also complains of left facial pain, left runny nose, cough that is nonproductive but increases at night. She denies dizziness or vertigo but says at times she feels like she's in a tunnel.  Outpatient Medications Prior to Visit  Medication Sig Dispense Refill  . estradiol (ESTRACE VAGINAL) 0.1 MG/GM vaginal cream Place 1 Applicatorful vaginally at bedtime. 42.5 g 3  . fluticasone (FLONASE) 50 MCG/ACT nasal spray Place 2 sprays into both nostrils daily. 16 g 11  . fluticasone furoate-vilanterol (BREO ELLIPTA) 200-25 MCG/INH AEPB Inhale 1 puff into the lungs daily. 90 each 3  . gabapentin (NEURONTIN) 300 MG capsule Take 1 capsule (300 mg total) by mouth at bedtime. 90 capsule 3  . indomethacin (INDOCIN) 50 MG capsule Take 1 capsule (50 mg total) by mouth 3 (three) times daily with meals. 90 capsule 4  . montelukast (SINGULAIR) 10 MG tablet Take 1 tablet (10 mg total) by mouth at bedtime. 90 tablet 3  . simvastatin (ZOCOR) 20 MG tablet Take 1 tablet (20 mg total) by mouth at bedtime. 90 tablet 3   No facility-administered medications prior to visit.     ROS Review of Systems  Constitutional: Negative.  Negative for appetite change, chills, diaphoresis, fatigue and fever.  HENT: Positive for congestion, postnasal drip, rhinorrhea, sinus pain and sinus pressure. Negative for ear discharge, ear pain, facial swelling and trouble swallowing.   Eyes: Negative.  Negative for  photophobia and visual disturbance.  Respiratory: Positive for cough. Negative for apnea, choking, chest tightness, shortness of breath, wheezing and stridor.   Cardiovascular: Negative.  Negative for chest pain, palpitations and leg swelling.  Gastrointestinal: Positive for nausea. Negative for abdominal pain, constipation, diarrhea and vomiting.  Endocrine: Negative.   Genitourinary: Negative.  Negative for difficulty urinating.  Musculoskeletal: Negative.  Negative for back pain, neck pain and neck stiffness.  Skin: Negative.  Negative for color change and rash.  Allergic/Immunologic: Negative.   Neurological: Positive for numbness and headaches. Negative for dizziness, tremors, seizures, syncope, facial asymmetry, speech difficulty, weakness and light-headedness.  Hematological: Negative.  Negative for adenopathy. Does not bruise/bleed easily.  Psychiatric/Behavioral: Negative.     Objective:  BP 124/74 (BP Location: Left Arm, Patient Position: Sitting, Cuff Size: Normal)   Pulse 87   Temp 98.7 F (37.1 C) (Oral)   Resp 16   Ht 5\' 2"  (1.575 m)   Wt 137 lb 4 oz (62.3 kg)   SpO2 97%   BMI 25.10 kg/m   BP Readings from Last 3 Encounters:  07/16/16 124/74  07/01/16 120/78  03/24/16 116/82    Wt Readings from Last 3 Encounters:  07/16/16 137 lb 4 oz (62.3 kg)  07/01/16 141 lb (64 kg)  12/31/15 142 lb (64.4 kg)    Physical Exam  Constitutional: She is oriented to person, place, and time. She appears well-developed and well-nourished. No distress.  HENT:  Head: Normocephalic and atraumatic.  Right Ear: Hearing, tympanic  membrane, external ear and ear canal normal.  Left Ear: Hearing, tympanic membrane, external ear and ear canal normal.  Nose: Right sinus exhibits no maxillary sinus tenderness and no frontal sinus tenderness. Left sinus exhibits maxillary sinus tenderness. Left sinus exhibits no frontal sinus tenderness.  Mouth/Throat: Oropharynx is clear and moist and  mucous membranes are normal. No oropharyngeal exudate, posterior oropharyngeal edema, posterior oropharyngeal erythema or tonsillar abscesses.  Eyes: Conjunctivae are normal. Right eye exhibits no discharge. Left eye exhibits no discharge. No scleral icterus.  Neck: Normal range of motion. Neck supple. No JVD present. No tracheal deviation present. No thyromegaly present.  Cardiovascular: Normal rate, regular rhythm, normal heart sounds and intact distal pulses.  Exam reveals no gallop and no friction rub.   No murmur heard. Pulmonary/Chest: Effort normal and breath sounds normal. No stridor. No respiratory distress. She has no wheezes. She has no rales. She exhibits no tenderness.  Abdominal: Soft. Bowel sounds are normal. She exhibits no distension and no mass. There is no tenderness. There is no rebound and no guarding.  Musculoskeletal: Normal range of motion. She exhibits no edema, tenderness or deformity.  Lymphadenopathy:    She has no cervical adenopathy.  Neurological: She is alert and oriented to person, place, and time. She has normal reflexes. She displays normal reflexes. No cranial nerve deficit. She exhibits normal muscle tone. Coordination normal.  Skin: Skin is warm and dry. No rash noted. She is not diaphoretic. No erythema. No pallor.  Psychiatric: She has a normal mood and affect. Her behavior is normal. Judgment and thought content normal.  Vitals reviewed.   Lab Results  Component Value Date   WBC 7.6 12/31/2015   HGB 15.5 (H) 12/31/2015   HCT 45.5 12/31/2015   PLT 250.0 12/31/2015   GLUCOSE 118 (H) 12/31/2015   CHOL 292 (H) 10/30/2015   TRIG 93.0 10/30/2015   HDL 57.50 10/30/2015   LDLCALC 216 (H) 10/30/2015   ALT 18 12/31/2015   AST 16 12/31/2015   NA 139 12/31/2015   K 3.5 12/31/2015   CL 101 12/31/2015   CREATININE 0.79 12/31/2015   BUN 16 12/31/2015   CO2 31 12/31/2015   TSH 0.70 10/30/2015   PSA WNL 10/30/2015   INR 1.1 (H) 12/31/2015   HGBA1C 6.0  10/30/2015    Mm Digital Screening Bilateral  Result Date: 10/30/2015 CLINICAL DATA:  Screening. EXAM: DIGITAL SCREENING BILATERAL MAMMOGRAM WITH CAD COMPARISON:  Previous exam(s). ACR Breast Density Category b: There are scattered areas of fibroglandular density. FINDINGS: There are no findings suspicious for malignancy. Images were processed with CAD. IMPRESSION: No mammographic evidence of malignancy. A result letter of this screening mammogram will be mailed directly to the patient. RECOMMENDATION: Screening mammogram in one year. (Code:SM-B-01Y) BI-RADS CATEGORY  1: Negative. Electronically Signed   By: Margarette Canada M.D.   On: 10/30/2015 16:41    Assessment & Plan:   Sadiah was seen today for headache.  Diagnoses and all orders for this visit:  Numbness and tingling in both hands- I've ordered an MRI to screen for demyelinating disorder, mass, CVA -     MR BRAIN WO CONTRAST; Future  Acute maxillary sinusitis, recurrence not specified- I will treat the infection with Augmentin -     amoxicillin-clavulanate (AUGMENTIN) 875-125 MG tablet; Take 1 tablet by mouth 2 (two) times daily. -     promethazine-dextromethorphan (PROMETHAZINE-DM) 6.25-15 MG/5ML syrup; Take 5 mLs by mouth 4 (four) times daily as needed for cough.  Intractable  episodic headache, unspecified headache type- as above -     MR BRAIN WO CONTRAST; Future  Cough- her exam and chest x-ray are normal, will treat for viral upper respiratory infection with Phenergan DM. I've also asked her to be more compliant with her nasal steroid spray and her inhaled LABA/ICS combination. -     DG Chest 2 View; Future -     promethazine-dextromethorphan (PROMETHAZINE-DM) 6.25-15 MG/5ML syrup; Take 5 mLs by mouth 4 (four) times daily as needed for cough.   I am having Ms. Depinto start on amoxicillin-clavulanate and promethazine-dextromethorphan. I am also having her maintain her montelukast, gabapentin, fluticasone, estradiol, fluticasone  furoate-vilanterol, simvastatin, and indomethacin.  Meds ordered this encounter  Medications  . amoxicillin-clavulanate (AUGMENTIN) 875-125 MG tablet    Sig: Take 1 tablet by mouth 2 (two) times daily.    Dispense:  20 tablet    Refill:  1  . promethazine-dextromethorphan (PROMETHAZINE-DM) 6.25-15 MG/5ML syrup    Sig: Take 5 mLs by mouth 4 (four) times daily as needed for cough.    Dispense:  118 mL    Refill:  0     Follow-up: No Follow-up on file.  Scarlette Calico, MD

## 2016-07-16 NOTE — Progress Notes (Signed)
Pre visit review using our clinic review tool, if applicable. No additional management support is needed unless otherwise documented below in the visit note. 

## 2016-07-18 ENCOUNTER — Other Ambulatory Visit: Payer: BC Managed Care – PPO

## 2016-07-29 ENCOUNTER — Ambulatory Visit: Payer: BC Managed Care – PPO | Admitting: Internal Medicine

## 2016-08-04 ENCOUNTER — Encounter: Payer: Self-pay | Admitting: Internal Medicine

## 2016-08-05 ENCOUNTER — Telehealth: Payer: Self-pay | Admitting: Internal Medicine

## 2016-08-05 NOTE — Telephone Encounter (Signed)
FYI

## 2016-08-05 NOTE — Telephone Encounter (Signed)
FYI: Patient states she can not afford to go to neurosurgery right now.  Patient state she will look at her insurance benefits the first of the year to determine if she will be able to make appt.  Patient will call back in regard.

## 2016-11-16 ENCOUNTER — Encounter: Payer: Self-pay | Admitting: Neurology

## 2016-11-17 ENCOUNTER — Other Ambulatory Visit: Payer: Self-pay | Admitting: *Deleted

## 2016-11-17 DIAGNOSIS — R202 Paresthesia of skin: Principal | ICD-10-CM

## 2016-11-17 DIAGNOSIS — R200 Anesthesia of skin: Secondary | ICD-10-CM

## 2016-11-26 ENCOUNTER — Ambulatory Visit (INDEPENDENT_AMBULATORY_CARE_PROVIDER_SITE_OTHER): Payer: BC Managed Care – PPO | Admitting: Neurology

## 2016-11-26 DIAGNOSIS — G5603 Carpal tunnel syndrome, bilateral upper limbs: Secondary | ICD-10-CM

## 2016-11-26 DIAGNOSIS — R202 Paresthesia of skin: Secondary | ICD-10-CM | POA: Diagnosis not present

## 2016-11-26 DIAGNOSIS — R200 Anesthesia of skin: Secondary | ICD-10-CM

## 2016-11-26 NOTE — Procedures (Signed)
[facility]  [place], [address]  [place], [place] [address] Tel: [phone] Fax:  [phone] Test Date:  11/26/2016  Patient: Julie Rivas DOB: 11-Sep-1955 Physician: Narda Amber, DO  Sex: Female Height: 5\' 2"  Ref Phys: Scarlette Calico, M.D.  ID#: [id number] Temp: 35.6C Technician:    Patient Complaints: This is a 61 year-old female with bilateral hand numbness and tingling.  NCV & EMG Findings: Extensive electrodiagnostic testing of the right upper extremity and additional studies of the left shows:  1. Right mixed palmer sensory responses are prolonged. Left median sensory response shows prolonged distal latency (4.1 ms) and normal amplitude. Bilateral ulnar and right median sensory responses are within normal limits. 2. Left median motor response shows prolonged latency (4.7 ms) and asymmetrically reduced amplitude (6.8 mV) as compared to the right (11.3 mV). Bilateral ulnar motor responses are within normal limits. 3. There is no evidence of active or chronic motor axon loss changes affecting any of the tested muscles. Motor unit configuration and recruitment pattern is within normal limits.  Impression: 1. Left median neuropathy at or distal to the wrist, consistent with the clinical diagnosis of carpal tunnel syndrome; moderate in degree electrically. 2. Right median neuropathy at or distal to the wrist, consistent with the clinical diagnosis of carpal tunnel syndrome; very mild in degree electrically.    ___________________________ Narda Amber, DO    Nerve Conduction Studies Anti Sensory Summary Table   Stim Site NR Peak (ms) Norm Peak (ms) P-T Amp (V) Norm P-T Amp  Left Median Anti Sensory (2nd Digit)  Wrist    4.1 <3.8 31.9 >10  Right Median Anti Sensory (2nd Digit)  Wrist    3.4 <3.8 29.9 >10  Left Ulnar Anti Sensory (5th Digit)  Wrist    2.4 <3.2 26.3 >5  Right Ulnar Anti Sensory (5th Digit)  Wrist    2.3 <3.2 28.0 >5   Motor Summary  Table   Stim Site NR Onset (ms) Norm Onset (ms) O-P Amp (mV) Norm O-P Amp Site1 Site2 Delta-0 (ms) Dist (cm) Vel (m/s) Norm Vel (m/s)  Left Median Motor (Abd Poll Brev)  Wrist    4.7 <4.0 6.8 >5 Elbow Wrist 4.4 26.0 59 >50  Elbow    9.1  6.2         Right Median Motor (Abd Poll Brev)  Wrist    3.5 <4.0 11.3 >5 Elbow Wrist 4.4 27.0 61 >50  Elbow    7.9  10.4         Left Ulnar Motor (Abd Dig Minimi)  Wrist    2.1 <3.1 8.2 >7 B Elbow Wrist 3.4 22.0 65 >50  B Elbow    5.5  7.9  A Elbow B Elbow 1.7 10.0 59 >50  A Elbow    7.2  7.6         Right Ulnar Motor (Abd Dig Minimi)  Wrist    2.0 <3.1 8.4 >7 B Elbow Wrist 3.2 21.0 66 >50  B Elbow    5.2  8.4  A Elbow B Elbow 1.7 10.0 59 >50  A Elbow    6.9  8.0          Comparison Summary Table   Stim Site NR Peak (ms) Norm Peak (ms) P-T Amp (V) Site1 Site2 Delta-P (ms) Norm Delta (ms)  Right Median/Ulnar Palm Comparison (Wrist - 8cm)  Median Palm    2.3 <2.2 21.6 Median Palm Ulnar Palm 0.7   Ulnar Palm  1.6 <2.2 14.6       EMG   Side Muscle Ins Act Fibs Psw Fasc Number Recrt Dur Dur. Amp Amp. Poly Poly. Comment  Right 1stDorInt Nml Nml Nml Nml Nml Nml Nml Nml Nml Nml Nml Nml N/A  Right Abd Poll Brev Nml Nml Nml Nml Nml Nml Nml Nml Nml Nml Nml Nml N/A  Right Ext Indicis Nml Nml Nml Nml Nml Nml Nml Nml Nml Nml Nml Nml N/A  Right PronatorTeres Nml Nml Nml Nml Nml Nml Nml Nml Nml Nml Nml Nml N/A  Right Biceps Nml Nml Nml Nml Nml Nml Nml Nml Nml Nml Nml Nml N/A  Right Triceps Nml Nml Nml Nml Nml Nml Nml Nml Nml Nml Nml Nml N/A  Right Deltoid Nml Nml Nml Nml Nml Nml Nml Nml Nml Nml Nml Nml N/A  Left 1stDorInt Nml Nml Nml Nml Nml Nml Nml Nml Nml Nml Nml Nml N/A  Left Abd Poll Brev Nml Nml Nml Nml Nml Nml Nml Nml Nml Nml Nml Nml N/A  Left PronatorTeres Nml Nml Nml Nml Nml Nml Nml Nml Nml Nml Nml Nml N/A  Left Biceps Nml Nml Nml Nml Nml Nml Nml Nml Nml Nml Nml Nml N/A  Left Triceps Nml Nml Nml Nml Nml Nml Nml Nml Nml Nml Nml Nml N/A  Left  Deltoid Nml Nml Nml Nml Nml Nml Nml Nml Nml Nml Nml Nml N/A      Waveforms:

## 2016-11-30 ENCOUNTER — Other Ambulatory Visit: Payer: Self-pay | Admitting: Internal Medicine

## 2016-11-30 DIAGNOSIS — Z1231 Encounter for screening mammogram for malignant neoplasm of breast: Secondary | ICD-10-CM

## 2016-12-03 ENCOUNTER — Telehealth: Payer: Self-pay

## 2016-12-03 ENCOUNTER — Other Ambulatory Visit: Payer: Self-pay | Admitting: Internal Medicine

## 2016-12-03 DIAGNOSIS — G5603 Carpal tunnel syndrome, bilateral upper limbs: Secondary | ICD-10-CM | POA: Insufficient documentation

## 2016-12-03 NOTE — Telephone Encounter (Signed)
Pt wanted to know what the next step is now that the results came back with carpel tunnel.   Also RF RQ for cyclobenzaprine and triamterene-HCTZ  Please advise.

## 2016-12-03 NOTE — Telephone Encounter (Signed)
Referral sent to see if she would benefit from surgery

## 2016-12-08 ENCOUNTER — Other Ambulatory Visit: Payer: Self-pay | Admitting: Internal Medicine

## 2016-12-08 DIAGNOSIS — M5431 Sciatica, right side: Secondary | ICD-10-CM

## 2016-12-08 NOTE — Telephone Encounter (Signed)
These refills were already sent on 03/26 Why am I getting another message about this?

## 2016-12-08 NOTE — Telephone Encounter (Signed)
Noted  

## 2016-12-08 NOTE — Telephone Encounter (Signed)
Also RF RQ for cyclobenzaprine and triamterene-HCTZ

## 2016-12-08 NOTE — Telephone Encounter (Signed)
LVM for pt to call back as soon as possible.   

## 2016-12-30 ENCOUNTER — Ambulatory Visit
Admission: RE | Admit: 2016-12-30 | Discharge: 2016-12-30 | Disposition: A | Discharge status: Home / Self Care | Payer: BC Managed Care – PPO | Source: Ambulatory Visit | Attending: Internal Medicine | Admitting: Internal Medicine

## 2016-12-30 DIAGNOSIS — Z1231 Encounter for screening mammogram for malignant neoplasm of breast: Secondary | ICD-10-CM

## 2016-12-30 LAB — HM MAMMOGRAPHY

## 2017-02-18 ENCOUNTER — Other Ambulatory Visit (INDEPENDENT_AMBULATORY_CARE_PROVIDER_SITE_OTHER): Payer: BC Managed Care – PPO

## 2017-02-18 ENCOUNTER — Ambulatory Visit (INDEPENDENT_AMBULATORY_CARE_PROVIDER_SITE_OTHER): Payer: BC Managed Care – PPO | Admitting: Internal Medicine

## 2017-02-18 ENCOUNTER — Encounter: Payer: Self-pay | Admitting: Internal Medicine

## 2017-02-18 VITALS — BP 122/80 | HR 64 | Temp 97.6°F | Resp 16 | Ht 62.0 in | Wt 140.0 lb

## 2017-02-18 DIAGNOSIS — R7303 Prediabetes: Secondary | ICD-10-CM | POA: Diagnosis not present

## 2017-02-18 DIAGNOSIS — E785 Hyperlipidemia, unspecified: Secondary | ICD-10-CM | POA: Diagnosis not present

## 2017-02-18 LAB — CBC WITH DIFFERENTIAL/PLATELET
BASOS ABS: 0.2 10*3/uL — AB (ref 0.0–0.1)
Basophils Relative: 2.6 % (ref 0.0–3.0)
EOS ABS: 0.1 10*3/uL (ref 0.0–0.7)
Eosinophils Relative: 1.6 % (ref 0.0–5.0)
HCT: 42.9 % (ref 36.0–46.0)
HEMOGLOBIN: 14.6 g/dL (ref 12.0–15.0)
LYMPHS ABS: 2.4 10*3/uL (ref 0.7–4.0)
Lymphocytes Relative: 34.8 % (ref 12.0–46.0)
MCHC: 34 g/dL (ref 30.0–36.0)
MCV: 92.9 fl (ref 78.0–100.0)
MONO ABS: 0.5 10*3/uL (ref 0.1–1.0)
Monocytes Relative: 6.6 % (ref 3.0–12.0)
NEUTROS PCT: 54.4 % (ref 43.0–77.0)
Neutro Abs: 3.8 10*3/uL (ref 1.4–7.7)
Platelets: 247 10*3/uL (ref 150.0–400.0)
RBC: 4.61 Mil/uL (ref 3.87–5.11)
RDW: 13.7 % (ref 11.5–15.5)
WBC: 6.9 10*3/uL (ref 4.0–10.5)

## 2017-02-18 LAB — LIPID PANEL
CHOLESTEROL: 264 mg/dL — AB (ref 0–200)
HDL: 50.7 mg/dL (ref >39.00)
LDL Cholesterol: 183 mg/dL — ABNORMAL HIGH (ref 0–99)
NONHDL: 213.47
Total CHOL/HDL Ratio: 5
Triglycerides: 153 mg/dL — ABNORMAL HIGH (ref 0.0–149.0)
VLDL: 30.6 mg/dL (ref 0.0–40.0)

## 2017-02-18 LAB — THYROID PANEL WITH TSH
FREE THYROXINE INDEX: 3.1 (ref 1.4–3.8)
T3 UPTAKE: 34 % (ref 22–35)
T4 TOTAL: 9 ug/dL (ref 4.5–12.0)
TSH: 0.65 m[IU]/L

## 2017-02-18 LAB — COMPREHENSIVE METABOLIC PANEL
ALBUMIN: 4.3 g/dL (ref 3.5–5.2)
ALK PHOS: 103 U/L (ref 39–117)
ALT: 22 U/L (ref 0–35)
AST: 20 U/L (ref 0–37)
BILIRUBIN TOTAL: 0.6 mg/dL (ref 0.2–1.2)
BUN: 10 mg/dL (ref 6–23)
CO2: 31 mEq/L (ref 19–32)
Calcium: 10 mg/dL (ref 8.4–10.5)
Chloride: 102 mEq/L (ref 96–112)
Creatinine, Ser: 0.69 mg/dL (ref 0.40–1.20)
GFR: 110.5 mL/min (ref >60.00)
GLUCOSE: 104 mg/dL — AB (ref 70–99)
Potassium: 3.5 mEq/L (ref 3.5–5.1)
SODIUM: 140 meq/L (ref 135–145)
TOTAL PROTEIN: 7.2 g/dL (ref 6.0–8.3)

## 2017-02-18 LAB — HEMOGLOBIN A1C: Hgb A1c MFr Bld: 6.1 % (ref 4.6–6.5)

## 2017-02-18 NOTE — Progress Notes (Signed)
Subjective:  Patient ID: Julie Rivas, female    DOB: November 22, 1955  Age: 62 y.o. MRN: [id number]  CC: Hypertension and Hyperlipidemia   HPI Julie Rivas presents for f/up - She complains of fatigue but offers no other symptoms. She has not been taking the statin.  Outpatient Medications Prior to Visit  Medication Sig Dispense Refill  . estradiol (ESTRACE VAGINAL) 0.1 MG/GM vaginal cream Place 1 Applicatorful vaginally at bedtime. 42.5 g 3  . fluticasone (FLONASE) 50 MCG/ACT nasal spray Place 2 sprays into both nostrils daily. 16 g 11  . fluticasone furoate-vilanterol (BREO ELLIPTA) 200-25 MCG/INH AEPB Inhale 1 puff into the lungs daily. 90 each 3  . gabapentin (NEURONTIN) 300 MG capsule TAKE 1 CAPSULE AT BEDTIME. 90 capsule 3  . montelukast (SINGULAIR) 10 MG tablet Take 1 tablet (10 mg total) by mouth at bedtime. 90 tablet 3  . triamterene-hydrochlorothiazide (DYAZIDE) 37.5-25 MG capsule TAKE 1 CAPSULE EVERY DAY. 90 capsule 0  . indomethacin (INDOCIN) 50 MG capsule Take 1 capsule (50 mg total) by mouth 3 (three) times daily with meals. 90 capsule 4  . simvastatin (ZOCOR) 20 MG tablet Take 1 tablet (20 mg total) by mouth at bedtime. 90 tablet 3  . cyclobenzaprine (FLEXERIL) 10 MG tablet TAKE 1 TABLET UP TO THREE TIMES DAILY. 90 tablet 0   No facility-administered medications prior to visit.     ROS Review of Systems  Constitutional: Positive for fatigue. Negative for activity change, appetite change, chills, diaphoresis, fever and unexpected weight change.  HENT: Negative.  Negative for trouble swallowing.   Eyes: Negative for visual disturbance.  Respiratory: Negative for cough, chest tightness, shortness of breath and wheezing.   Cardiovascular: Negative for chest pain, palpitations and leg swelling.  Gastrointestinal: Negative.  Negative for abdominal pain, blood in stool, constipation, diarrhea, nausea and vomiting.  Endocrine: Negative.  Negative for cold intolerance and  heat intolerance.  Genitourinary: Negative.  Negative for decreased urine volume, difficulty urinating, enuresis, flank pain, hematuria and urgency.  Musculoskeletal: Negative.  Negative for back pain.  Skin: Negative.  Negative for rash.  Allergic/Immunologic: Negative.   Neurological: Negative.  Negative for dizziness and weakness.  Hematological: Negative.   Psychiatric/Behavioral: Negative.     Objective:  BP 122/80 (BP Location: Left Arm, Patient Position: Sitting, Cuff Size: Normal)   Pulse 64   Temp 97.6 F (36.4 C) (Oral)   Resp 16   Ht 5\' 2"  (1.575 m)   Wt 140 lb (63.5 kg)   SpO2 96%   BMI 25.61 kg/m   BP Readings from Last 3 Encounters:  02/18/17 122/80  07/16/16 124/74  07/01/16 120/78    Wt Readings from Last 3 Encounters:  02/18/17 140 lb (63.5 kg)  07/16/16 137 lb 4 oz (62.3 kg)  07/01/16 141 lb (64 kg)    Physical Exam  Constitutional: She is oriented to person, place, and time.  HENT:  Mouth/Throat: Oropharynx is clear and moist. No oropharyngeal exudate.  Eyes: Conjunctivae are normal. Right eye exhibits no discharge. Left eye exhibits no discharge. No scleral icterus.  Neck: Normal range of motion. Neck supple. No JVD present. No tracheal deviation present. No thyromegaly present.  Cardiovascular: Normal rate, regular rhythm and intact distal pulses.  Exam reveals no gallop.   No murmur heard. Pulmonary/Chest: Effort normal and breath sounds normal. No stridor. No respiratory distress. She has no wheezes. She has no rales. She exhibits no tenderness.  Abdominal: Soft. Bowel sounds are normal. She exhibits  no distension and no mass. There is no tenderness. There is no rebound and no guarding.  Musculoskeletal: Normal range of motion. She exhibits no edema or tenderness.  Lymphadenopathy:    She has no cervical adenopathy.  Neurological: She is alert and oriented to person, place, and time.  Skin: Skin is warm and dry. No rash noted. She is not  diaphoretic. No erythema. No pallor.  Vitals reviewed.   Lab Results  Component Value Date   WBC 6.9 02/18/2017   HGB 14.6 02/18/2017   HCT 42.9 02/18/2017   PLT 247.0 02/18/2017   GLUCOSE 104 (H) 02/18/2017   CHOL 264 (H) 02/18/2017   TRIG 153.0 (H) 02/18/2017   HDL 50.70 02/18/2017   LDLCALC 183 (H) 02/18/2017   ALT 22 02/18/2017   AST 20 02/18/2017   NA 140 02/18/2017   K 3.5 02/18/2017   CL 102 02/18/2017   CREATININE 0.69 02/18/2017   BUN 10 02/18/2017   CO2 31 02/18/2017   TSH 0.65 02/18/2017   PSA WNL 10/30/2015   INR 1.1 (H) 12/31/2015   HGBA1C 6.1 02/18/2017    Mm Digital Screening Bilateral  Result Date: 12/30/2016 CLINICAL DATA:  Screening. EXAM: DIGITAL SCREENING BILATERAL MAMMOGRAM WITH CAD COMPARISON:  Previous exam(s). ACR Breast Density Category c: The breast tissue is heterogeneously dense, which may obscure small masses. FINDINGS: There are no findings suspicious for malignancy. Images were processed with CAD. IMPRESSION: No mammographic evidence of malignancy. A result letter of this screening mammogram will be mailed directly to the patient. RECOMMENDATION: Screening mammogram in one year. (Code:SM-B-01Y) BI-RADS CATEGORY  1: Negative. Electronically Signed   By: Abelardo Diesel M.D.   On: 12/30/2016 13:15    Assessment & Plan:   Julie Rivas was seen today for hypertension and hyperlipidemia.  Diagnoses and all orders for this visit:  Hyperlipidemia with target LDL less than 130- her Framingham risk score is only 4% so I do not recommend that she start taking a statin for CV risk reduction. -     Lipid panel; Future -     CBC with Differential/Platelet; Future -     Thyroid Panel With TSH; Future  Prediabetes- her A1c is 6.1%, she does not need a medication to treat this but agrees to work on her lifestyle modifications. -     Comprehensive metabolic panel; Future -     Hemoglobin A1c; Future   I have discontinued Julie Rivas's simvastatin, indomethacin,  and cyclobenzaprine. I am also having her maintain her montelukast, fluticasone, estradiol, fluticasone furoate-vilanterol, triamterene-hydrochlorothiazide, and gabapentin.  No orders of the defined types were placed in this encounter.    Follow-up: Return in about 4 months (around 06/20/2017).  Scarlette Calico, MD

## 2017-02-18 NOTE — Patient Instructions (Signed)

## 2017-06-01 ENCOUNTER — Other Ambulatory Visit: Payer: Self-pay | Admitting: Internal Medicine

## 2017-06-23 ENCOUNTER — Other Ambulatory Visit: Payer: BC Managed Care – PPO

## 2017-06-23 ENCOUNTER — Ambulatory Visit (INDEPENDENT_AMBULATORY_CARE_PROVIDER_SITE_OTHER): Payer: BC Managed Care – PPO | Admitting: Nurse Practitioner

## 2017-06-23 ENCOUNTER — Encounter: Payer: Self-pay | Admitting: Nurse Practitioner

## 2017-06-23 VITALS — BP 140/86 | HR 89 | Temp 97.6°F | Ht 62.0 in | Wt 141.0 lb

## 2017-06-23 DIAGNOSIS — R102 Pelvic and perineal pain: Secondary | ICD-10-CM | POA: Diagnosis not present

## 2017-06-23 DIAGNOSIS — R300 Dysuria: Secondary | ICD-10-CM

## 2017-06-23 LAB — POCT URINALYSIS DIPSTICK
Bilirubin, UA: NEGATIVE
Blood, UA: 10
Glucose, UA: NEGATIVE
Ketones, UA: NEGATIVE
Leukocytes, UA: NEGATIVE
NITRITE UA: NEGATIVE
PROTEIN UA: NEGATIVE
UROBILINOGEN UA: 0.2 U/dL
pH, UA: 5.5 (ref 5.0–8.0)

## 2017-06-23 MED ORDER — LIDOCAINE 4 % EX CREA: 1.0000 "application " | TOPICAL_CREAM | Freq: Three times a day (TID) | CUTANEOUS | 1 refills | Status: DC | PRN

## 2017-06-23 NOTE — Progress Notes (Signed)
Subjective:  Patient ID: Julie Rivas, female    DOB: Nov 02, 1955  Age: 61 y.o. MRN: [id number]  CC: Vaginal Pain (burning sensation,painful,pressure. going on for 5 days. )   Vaginal Pain  The patient's pertinent negatives include no genital itching, genital lesions, genital odor, genital rash, pelvic pain, vaginal bleeding or vaginal discharge. This is a new problem. The current episode started in the past 7 days. The problem occurs constantly. The problem has been unchanged. The problem affects both sides. She is not pregnant. Associated symptoms include dysuria. Pertinent negatives include no abdominal pain, anorexia, back pain, chills, constipation, diarrhea, discolored urine, fever, frequency, joint pain, nausea, painful intercourse or urgency. She is sexually active. No, her partner does not have an STD. She is postmenopausal. There is no history of a gynecological surgery, herpes simplex or vaginosis.    Outpatient Medications Prior to Visit  Medication Sig Dispense Refill  . estradiol (ESTRACE VAGINAL) 0.1 MG/GM vaginal cream Place 1 Applicatorful vaginally at bedtime. 42.5 g 3  . fluticasone (FLONASE) 50 MCG/ACT nasal spray Place 2 sprays into both nostrils daily. 16 g 11  . fluticasone furoate-vilanterol (BREO ELLIPTA) 200-25 MCG/INH AEPB Inhale 1 puff into the lungs daily. 90 each 3  . gabapentin (NEURONTIN) 300 MG capsule TAKE 1 CAPSULE AT BEDTIME. 90 capsule 3  . montelukast (SINGULAIR) 10 MG tablet Take 1 tablet (10 mg total) by mouth at bedtime. 90 tablet 3  . triamterene-hydrochlorothiazide (DYAZIDE) 37.5-25 MG capsule TAKE 1 CAPSULE EVERY DAY. 30 capsule 0   No facility-administered medications prior to visit.     ROS See HPI  Objective:  BP 140/86   Pulse 89   Temp 97.6 F (36.4 C)   Ht 5\' 2"  (1.575 m)   Wt 141 lb (64 kg)   SpO2 98%   BMI 25.79 kg/m   BP Readings from Last 3 Encounters:  06/23/17 140/86  02/18/17 122/80  07/16/16 124/74    Wt Readings  from Last 3 Encounters:  06/23/17 141 lb (64 kg)  02/18/17 140 lb (63.5 kg)  07/16/16 137 lb 4 oz (62.3 kg)    Physical Exam  Constitutional: She is oriented to person, place, and time. No distress.  Cardiovascular: Normal rate.   Pulmonary/Chest: Effort normal.  Abdominal: Hernia confirmed negative in the right inguinal area and confirmed negative in the left inguinal area.  Genitourinary: Rectal exam shows external hemorrhoid. No labial fusion. There is tenderness on the right labia. There is no rash, lesion or injury on the right labia. There is tenderness on the left labia. There is no rash, lesion or injury on the left labia. Right adnexum displays no tenderness and no fullness. Left adnexum displays no tenderness and no fullness. No erythema, tenderness or bleeding in the vagina. No foreign body in the vagina. No signs of injury around the vagina. No vaginal discharge found.  Genitourinary Comments: Chaperone present, Labia tenderness  Lymphadenopathy:       Right: No inguinal adenopathy present.       Left: No inguinal adenopathy present.  Neurological: She is alert and oriented to person, place, and time.  Skin: Skin is warm and dry. No rash noted. No erythema.  Vitals reviewed.   Lab Results  Component Value Date   WBC 6.9 02/18/2017   HGB 14.6 02/18/2017   HCT 42.9 02/18/2017   PLT 247.0 02/18/2017   GLUCOSE 104 (H) 02/18/2017   CHOL 264 (H) 02/18/2017   TRIG 153.0 (H) 02/18/2017  HDL 50.70 02/18/2017   LDLCALC 183 (H) 02/18/2017   ALT 22 02/18/2017   AST 20 02/18/2017   NA 140 02/18/2017   K 3.5 02/18/2017   CL 102 02/18/2017   CREATININE 0.69 02/18/2017   BUN 10 02/18/2017   CO2 31 02/18/2017   TSH 0.65 02/18/2017   PSA WNL 10/30/2015   INR 1.1 (H) 12/31/2015   HGBA1C 6.1 02/18/2017    Mm Digital Screening Bilateral  Result Date: 12/30/2016 CLINICAL DATA:  Screening. EXAM: DIGITAL SCREENING BILATERAL MAMMOGRAM WITH CAD COMPARISON:  Previous exam(s). ACR  Breast Density Category c: The breast tissue is heterogeneously dense, which may obscure small masses. FINDINGS: There are no findings suspicious for malignancy. Images were processed with CAD. IMPRESSION: No mammographic evidence of malignancy. A result letter of this screening mammogram will be mailed directly to the patient. RECOMMENDATION: Screening mammogram in one year. (Code:SM-B-01Y) BI-RADS CATEGORY  1: Negative. Electronically Signed   By: Abelardo Diesel M.D.   On: 12/30/2016 13:15    Assessment & Plan:   Karlyn was seen today for vaginal pain.  Diagnoses and all orders for this visit:  Dysuria -     POCT urinalysis dipstick -     Urine Culture; Future  Vaginal pain -     Ambulatory referral to Gynecology -     lidocaine (LMX) 4 % cream; Apply 1 application topically 3 (three) times daily as needed.   I am having Ms. Todaro start on lidocaine. I am also having her maintain her montelukast, fluticasone, estradiol, fluticasone furoate-vilanterol, gabapentin, and triamterene-hydrochlorothiazide.  Meds ordered this encounter  Medications  . lidocaine (LMX) 4 % cream    Sig: Apply 1 application topically 3 (three) times daily as needed.    Dispense:  30 g    Refill:  1    Order Specific Question:   Supervising Provider    Answer:   Binnie Rail [[id number]]    Follow-up: Return if symptoms worsen or fail to improve.  Wilfred Lacy, NP

## 2017-06-24 LAB — URINE CULTURE
MICRO NUMBER:: 81159107
SPECIMEN QUALITY: ADEQUATE

## 2017-07-13 ENCOUNTER — Ambulatory Visit: Payer: BC Managed Care – PPO | Admitting: Obstetrics & Gynecology

## 2017-09-14 ENCOUNTER — Encounter: Payer: Self-pay | Admitting: Internal Medicine

## 2017-09-14 ENCOUNTER — Other Ambulatory Visit (INDEPENDENT_AMBULATORY_CARE_PROVIDER_SITE_OTHER): Payer: BC Managed Care – PPO

## 2017-09-14 ENCOUNTER — Ambulatory Visit (INDEPENDENT_AMBULATORY_CARE_PROVIDER_SITE_OTHER)
Admission: RE | Admit: 2017-09-14 | Discharge: 2017-09-14 | Disposition: A | Discharge status: Home / Self Care | Payer: BC Managed Care – PPO | Source: Ambulatory Visit | Attending: Internal Medicine | Admitting: Internal Medicine

## 2017-09-14 ENCOUNTER — Ambulatory Visit (INDEPENDENT_AMBULATORY_CARE_PROVIDER_SITE_OTHER): Payer: BC Managed Care – PPO | Admitting: Internal Medicine

## 2017-09-14 VITALS — BP 130/80 | HR 94 | Temp 98.6°F | Resp 16 | Ht 62.0 in | Wt 143.2 lb

## 2017-09-14 DIAGNOSIS — R1084 Generalized abdominal pain: Secondary | ICD-10-CM

## 2017-09-14 DIAGNOSIS — R05 Cough: Secondary | ICD-10-CM

## 2017-09-14 DIAGNOSIS — R059 Cough, unspecified: Secondary | ICD-10-CM

## 2017-09-14 DIAGNOSIS — R0789 Other chest pain: Secondary | ICD-10-CM | POA: Diagnosis not present

## 2017-09-14 DIAGNOSIS — E876 Hypokalemia: Secondary | ICD-10-CM

## 2017-09-14 DIAGNOSIS — J4540 Moderate persistent asthma, uncomplicated: Secondary | ICD-10-CM

## 2017-09-14 LAB — URINALYSIS, ROUTINE W REFLEX MICROSCOPIC
BILIRUBIN URINE: NEGATIVE
KETONES UR: NEGATIVE
Leukocytes, UA: NEGATIVE
NITRITE: NEGATIVE
PH: 7.5 (ref 5.0–8.0)
Specific Gravity, Urine: 1.015 (ref 1.000–1.030)
Total Protein, Urine: NEGATIVE
URINE GLUCOSE: NEGATIVE
Urobilinogen, UA: 0.2 (ref 0.0–1.0)

## 2017-09-14 LAB — COMPREHENSIVE METABOLIC PANEL
ALT: 13 U/L (ref 0–35)
AST: 13 U/L (ref 0–37)
Albumin: 4.1 g/dL (ref 3.5–5.2)
Alkaline Phosphatase: 102 U/L (ref 39–117)
BUN: 9 mg/dL (ref 6–23)
CALCIUM: 9.6 mg/dL (ref 8.4–10.5)
CHLORIDE: 102 meq/L (ref 96–112)
CO2: 33 meq/L — AB (ref 19–32)
Creatinine, Ser: 0.6 mg/dL (ref 0.40–1.20)
GFR: 129.6 mL/min (ref >60.00)
GLUCOSE: 91 mg/dL (ref 70–99)
POTASSIUM: 3.1 meq/L — AB (ref 3.5–5.1)
Sodium: 142 mEq/L (ref 135–145)
Total Bilirubin: 0.3 mg/dL (ref 0.2–1.2)
Total Protein: 6.9 g/dL (ref 6.0–8.3)

## 2017-09-14 LAB — CBC WITH DIFFERENTIAL/PLATELET
BASOS ABS: 0.1 10*3/uL (ref 0.0–0.1)
BASOS PCT: 1.6 % (ref 0.0–3.0)
Eosinophils Absolute: 0.2 10*3/uL (ref 0.0–0.7)
Eosinophils Relative: 2.4 % (ref 0.0–5.0)
HCT: 42.2 % (ref 36.0–46.0)
Hemoglobin: 14.1 g/dL (ref 12.0–15.0)
LYMPHS ABS: 2.5 10*3/uL (ref 0.7–4.0)
Lymphocytes Relative: 37.9 % (ref 12.0–46.0)
MCHC: 33.5 g/dL (ref 30.0–36.0)
MCV: 94.7 fl (ref 78.0–100.0)
MONOS PCT: 5.5 % (ref 3.0–12.0)
Monocytes Absolute: 0.4 10*3/uL (ref 0.1–1.0)
NEUTROS ABS: 3.4 10*3/uL (ref 1.4–7.7)
NEUTROS PCT: 52.6 % (ref 43.0–77.0)
PLATELETS: 261 10*3/uL (ref 150.0–400.0)
RBC: 4.46 Mil/uL (ref 3.87–5.11)
RDW: 13 % (ref 11.5–15.5)
WBC: 6.6 10*3/uL (ref 4.0–10.5)

## 2017-09-14 LAB — POCT EXHALED NITRIC OXIDE: FeNO level (ppb): 11

## 2017-09-14 LAB — SEDIMENTATION RATE: Sed Rate: 7 mm/hr (ref 0–30)

## 2017-09-14 LAB — AMYLASE: AMYLASE: 46 U/L (ref 27–131)

## 2017-09-14 LAB — TROPONIN I: TNIDX: 0 ug/l (ref 0.00–0.06)

## 2017-09-14 LAB — LIPASE: Lipase: 13 U/L (ref 11.0–59.0)

## 2017-09-14 MED ORDER — PROMETHAZINE-DM 6.25-15 MG/5ML PO SYRP: 5.0000 mL | ORAL_SOLUTION | Freq: Four times a day (QID) | ORAL | 0 refills | Status: DC | PRN

## 2017-09-14 MED ORDER — POTASSIUM CHLORIDE CRYS ER 15 MEQ PO TBCR: 15.0000 meq | EXTENDED_RELEASE_TABLET | Freq: Three times a day (TID) | ORAL | 3 refills | Status: DC

## 2017-09-14 MED ORDER — FLUTICASONE FUROATE-VILANTEROL 200-25 MCG/INH IN AEPB: 1.0000 | INHALATION_SPRAY | Freq: Every day | RESPIRATORY_TRACT | 3 refills | Status: DC

## 2017-09-14 NOTE — Patient Instructions (Signed)
Cough, Adult  Coughing is a reflex that clears your throat and your airways. Coughing helps to heal and protect your lungs. It is normal to cough occasionally, but a cough that happens with other symptoms or lasts a long time may be a sign of a condition that needs treatment. A cough may last only 2-3 weeks (acute), or it may last longer than 8 weeks (chronic).  What are the causes?  Coughing is commonly caused by:   Breathing in substances that irritate your lungs.   A viral or bacterial respiratory infection.   Allergies.   Asthma.   Postnasal drip.   Smoking.   Acid backing up from the stomach into the esophagus (gastroesophageal reflux).   Certain medicines.   Chronic lung problems, including COPD (or rarely, lung cancer).   Other medical conditions such as heart failure.    Follow these instructions at home:  Pay attention to any changes in your symptoms. Take these actions to help with your discomfort:   Take medicines only as told by your health care provider.  ? If you were prescribed an antibiotic medicine, take it as told by your health care provider. Do not stop taking the antibiotic even if you start to feel better.  ? Talk with your health care provider before you take a cough suppressant medicine.   Drink enough fluid to keep your urine clear or pale yellow.   If the air is dry, use a cold steam vaporizer or humidifier in your bedroom or your home to help loosen secretions.   Avoid anything that causes you to cough at work or at home.   If your cough is worse at night, try sleeping in a semi-upright position.   Avoid cigarette smoke. If you smoke, quit smoking. If you need help quitting, ask your health care provider.   Avoid caffeine.   Avoid alcohol.   Rest as needed.    Contact a health care provider if:   You have new symptoms.   You cough up pus.   Your cough does not get better after 2-3 weeks, or your cough gets worse.   You cannot control your cough with suppressant  medicines and you are losing sleep.   You develop pain that is getting worse or pain that is not controlled with pain medicines.   You have a fever.   You have unexplained weight loss.   You have night sweats.  Get help right away if:   You cough up blood.   You have difficulty breathing.   Your heartbeat is very fast.  This information is not intended to replace advice given to you by your health care provider. Make sure you discuss any questions you have with your health care provider.  Document Released: 02/20/2011 Document Revised: 01/30/2016 Document Reviewed: 10/31/2014  Elsevier Interactive Patient Education  2018 [employer]

## 2017-09-14 NOTE — Progress Notes (Signed)
Subjective:  Patient ID: Julie Rivas, female    DOB: 1956/01/04  Age: 62 y.o. MRN: [id number]  CC: Cough and Asthma   HPI Julie Rivas presents for a several week history of nonproductive cough that increases at night and when she lays down.  She also complains of shortness of breath, laryngitis and wheezing.  She has had a few episodes of chest tightness at rest associated with anxiety.  She denies hemoptysis, fever, or chills.  She has also had intermittent abdominal pain that she describes as cramping.  She reports normal bowel movements.  Her blood pressure has been well controlled and she has not been taking the diuretic.  Outpatient Medications Prior to Visit  Medication Sig Dispense Refill  . estradiol (ESTRACE VAGINAL) 0.1 MG/GM vaginal cream Place 1 Applicatorful vaginally at bedtime. 42.5 g 3  . fluticasone (FLONASE) 50 MCG/ACT nasal spray Place 2 sprays into both nostrils daily. 16 g 11  . gabapentin (NEURONTIN) 300 MG capsule TAKE 1 CAPSULE AT BEDTIME. 90 capsule 3  . lidocaine (LMX) 4 % cream Apply 1 application topically 3 (three) times daily as needed. 30 g 1  . montelukast (SINGULAIR) 10 MG tablet Take 1 tablet (10 mg total) by mouth at bedtime. 90 tablet 3  . fluticasone furoate-vilanterol (BREO ELLIPTA) 200-25 MCG/INH AEPB Inhale 1 puff into the lungs daily. 90 each 3  . triamterene-hydrochlorothiazide (DYAZIDE) 37.5-25 MG capsule TAKE 1 CAPSULE EVERY DAY. 30 capsule 0   No facility-administered medications prior to visit.     ROS Review of Systems  Constitutional: Negative for appetite change, chills, diaphoresis, fatigue and fever.  HENT: Positive for voice change. Negative for sore throat and trouble swallowing.   Eyes: Negative.   Respiratory: Positive for cough, shortness of breath and wheezing. Negative for chest tightness.   Cardiovascular: Negative.  Negative for chest pain, palpitations and leg swelling.  Gastrointestinal: Negative for abdominal pain,  constipation, diarrhea, nausea and vomiting.  Endocrine: Negative.   Genitourinary: Negative.  Negative for decreased urine volume, difficulty urinating, flank pain, hematuria, urgency and vaginal discharge.  Musculoskeletal: Negative.  Negative for back pain and myalgias.  Skin: Negative.  Negative for rash.  Allergic/Immunologic: Negative.   Neurological: Negative.  Negative for dizziness, weakness and light-headedness.  Hematological: Negative for adenopathy. Does not bruise/bleed easily.  Psychiatric/Behavioral: Negative.     Objective:  BP 130/80 (BP Location: Left Arm, Patient Position: Sitting, Cuff Size: Normal)   Pulse 94   Temp 98.6 F (37 C) (Oral)   Resp 16   Ht 5\' 2"  (1.575 m)   Wt 143 lb 4 oz (65 kg)   SpO2 98%   BMI 26.20 kg/m   BP Readings from Last 3 Encounters:  09/14/17 130/80  06/23/17 140/86  02/18/17 122/80    Wt Readings from Last 3 Encounters:  09/14/17 143 lb 4 oz (65 kg)  06/23/17 141 lb (64 kg)  02/18/17 140 lb (63.5 kg)    Physical Exam  Constitutional: She is oriented to person, place, and time.  Non-toxic appearance. She does not have a sickly appearance. She does not appear ill. No distress.  HENT:  Mouth/Throat: Oropharynx is clear and moist. No oropharyngeal exudate.  Eyes: Conjunctivae are normal. Left eye exhibits no discharge. No scleral icterus.  Neck: Normal range of motion. Neck supple. No JVD present. No thyromegaly present.  Cardiovascular: Normal rate, regular rhythm and normal heart sounds.  No murmur heard. EKG --  Sinus  Rhythm  WITHIN  NORMAL LIMITS- no change from the prior EKG   Pulmonary/Chest: Effort normal and breath sounds normal. No respiratory distress. She has no wheezes. She has no rales.  Abdominal: Soft. Normal appearance and bowel sounds are normal. She exhibits no mass. There is no hepatosplenomegaly. There is no tenderness. There is no rebound and no CVA tenderness.  Musculoskeletal: Normal range of motion.  She exhibits no edema, tenderness or deformity.  Lymphadenopathy:    She has no cervical adenopathy.  Neurological: She is alert and oriented to person, place, and time.  Skin: Skin is warm and dry. No rash noted. She is not diaphoretic. No erythema. No pallor.  Psychiatric: She has a normal mood and affect. Her behavior is normal. Judgment and thought content normal.  Vitals reviewed.   Lab Results  Component Value Date   WBC 6.6 09/14/2017   HGB 14.1 09/14/2017   HCT 42.2 09/14/2017   PLT 261.0 09/14/2017   GLUCOSE 91 09/14/2017   CHOL 264 (H) 02/18/2017   TRIG 153.0 (H) 02/18/2017   HDL 50.70 02/18/2017   LDLCALC 183 (H) 02/18/2017   ALT 13 09/14/2017   AST 13 09/14/2017   NA 142 09/14/2017   K 3.1 (L) 09/14/2017   CL 102 09/14/2017   CREATININE 0.60 09/14/2017   BUN 9 09/14/2017   CO2 33 (H) 09/14/2017   TSH 0.65 02/18/2017   PSA WNL 10/30/2015   INR 1.1 (H) 12/31/2015   HGBA1C 6.1 02/18/2017    Mm Digital Screening Bilateral  Result Date: 12/30/2016 CLINICAL DATA:  Screening. EXAM: DIGITAL SCREENING BILATERAL MAMMOGRAM WITH CAD COMPARISON:  Previous exam(s). ACR Breast Density Category c: The breast tissue is heterogeneously dense, which may obscure small masses. FINDINGS: There are no findings suspicious for malignancy. Images were processed with CAD. IMPRESSION: No mammographic evidence of malignancy. A result letter of this screening mammogram will be mailed directly to the patient. RECOMMENDATION: Screening mammogram in one year. (Code:SM-B-01Y) BI-RADS CATEGORY  1: Negative. Electronically Signed   By: Abelardo Diesel M.D.   On: 12/30/2016 13:15   Dg Abd Acute W/chest  Result Date: 09/14/2017 CLINICAL DATA:  Cough and lower abdominal pain.  Diarrhea. EXAM: DG ABDOMEN ACUTE W/ 1V CHEST COMPARISON:  Chest radiograph July 16, 2016 FINDINGS: PA chest: There is no edema or consolidation. The heart size and pulmonary vascularity are normal. No adenopathy. Supine and  upright abdomen: There is moderate stool throughout the colon. There is no bowel dilatation or air-fluid level to suggest bowel obstruction. No free air. There is a probable phlebolith in the right pelvis. IMPRESSION: Moderate stool throughout colon. No bowel obstruction or free air. No lung edema or consolidation. Electronically Signed   By: Lowella Grip III M.D.   On: 09/14/2017 10:44    Assessment & Plan:   Julie Rivas was seen today for cough and asthma.  Diagnoses and all orders for this visit:  Cough- Her FeNo score is not elevated so I do not think she would benefit from a course of systemic steroids.  Her chest x-ray is negative for infiltrate or mass. -     POCT EXHALED NITRIC OXIDE -     DG Abd Acute W/Chest; Future -     promethazine-dextromethorphan (PROMETHAZINE-DM) 6.25-15 MG/5ML syrup; Take 5 mLs by mouth 4 (four) times daily as needed for cough.  Moderate persistent asthma without complication-  Will continue the combination of LABA/SABA/ICS to treat the asthma. -     fluticasone furoate-vilanterol (BREO ELLIPTA) 200-25 MCG/INH AEPB;  Inhale 1 puff into the lungs daily. -     albuterol (PROVENTIL) (2.5 MG/3ML) 0.083% nebulizer solution; Take 3 mLs (2.5 mg total) by nebulization every 6 (six) hours as needed for wheezing or shortness of breath.  Chest tightness- Her EKG is normal, troponin is negative.  The chest pain occurs at rest and does not sound like it is ischemia.  This is most likely related to the stress related to marital discord. -     EKG 12-Lead -     Troponin I; Future  Generalized abdominal pain- She has abdominal pain but a benign abdominal exam.  Plain films of the abdomen only shows stool retention.  Screening for inflammation in the liver and pancreas is negative.  Sedimentation rate is normal.  Urinalysis is negative for blood or white cells.  This is most likely IBS related to stress. -     Comprehensive metabolic panel; Future -     CBC with  Differential/Platelet; Future -     Lipase; Future -     Amylase; Future -     Sedimentation rate; Future -     Urinalysis, Routine w reflex microscopic; Future -     DG Abd Acute W/Chest; Future  Hypokalemia-  Will start potassium replacement therapy. -     Discontinue: potassium chloride SA (KLOR-CON M15) 15 MEQ tablet; Take 1 tablet (15 mEq total) by mouth 3 (three) times daily.   I have discontinued Julie Rivas's triamterene-hydrochlorothiazide. I am also having her start on promethazine-dextromethorphan and albuterol. Additionally, I am having her maintain her montelukast, fluticasone, estradiol, gabapentin, lidocaine, and fluticasone furoate-vilanterol.  Meds ordered this encounter  Medications  . fluticasone furoate-vilanterol (BREO ELLIPTA) 200-25 MCG/INH AEPB    Sig: Inhale 1 puff into the lungs daily.    Dispense:  90 each    Refill:  3  . promethazine-dextromethorphan (PROMETHAZINE-DM) 6.25-15 MG/5ML syrup    Sig: Take 5 mLs by mouth 4 (four) times daily as needed for cough.    Dispense:  118 mL    Refill:  0  . DISCONTD: potassium chloride SA (KLOR-CON M15) 15 MEQ tablet    Sig: Take 1 tablet (15 mEq total) by mouth 3 (three) times daily.    Dispense:  90 tablet    Refill:  3  . albuterol (PROVENTIL) (2.5 MG/3ML) 0.083% nebulizer solution    Sig: Take 3 mLs (2.5 mg total) by nebulization every 6 (six) hours as needed for wheezing or shortness of breath.    Dispense:  150 mL    Refill:  2   No results found.  Follow-up: Return in about 3 weeks (around 10/05/2017).  Scarlette Calico, MD

## 2017-09-15 ENCOUNTER — Other Ambulatory Visit: Payer: Self-pay | Admitting: Internal Medicine

## 2017-09-15 DIAGNOSIS — E876 Hypokalemia: Secondary | ICD-10-CM

## 2017-09-15 MED ORDER — ALBUTEROL SULFATE (2.5 MG/3ML) 0.083% IN NEBU: 2.5000 mg | INHALATION_SOLUTION | Freq: Four times a day (QID) | RESPIRATORY_TRACT | 2 refills | Status: DC | PRN

## 2017-09-15 MED ORDER — POTASSIUM CHLORIDE ER 10 MEQ PO TBCR: 10.0000 meq | EXTENDED_RELEASE_TABLET | Freq: Three times a day (TID) | ORAL | 1 refills | Status: DC

## 2017-09-15 NOTE — Progress Notes (Unsigned)
Subjective:  Patient ID: Julie Rivas, female    DOB: 10/22/1955  Age: 62 y.o. MRN: [id number]  CC: No chief complaint on file.   HPI Julie Rivas presents for ***  Outpatient Medications Prior to Visit  Medication Sig Dispense Refill  . albuterol (PROVENTIL) (2.5 MG/3ML) 0.083% nebulizer solution Take 3 mLs (2.5 mg total) by nebulization every 6 (six) hours as needed for wheezing or shortness of breath. 150 mL 2  . estradiol (ESTRACE VAGINAL) 0.1 MG/GM vaginal cream Place 1 Applicatorful vaginally at bedtime. 42.5 g 3  . fluticasone (FLONASE) 50 MCG/ACT nasal spray Place 2 sprays into both nostrils daily. 16 g 11  . fluticasone furoate-vilanterol (BREO ELLIPTA) 200-25 MCG/INH AEPB Inhale 1 puff into the lungs daily. 90 each 3  . gabapentin (NEURONTIN) 300 MG capsule TAKE 1 CAPSULE AT BEDTIME. 90 capsule 3  . lidocaine (LMX) 4 % cream Apply 1 application topically 3 (three) times daily as needed. 30 g 1  . montelukast (SINGULAIR) 10 MG tablet Take 1 tablet (10 mg total) by mouth at bedtime. 90 tablet 3  . potassium chloride SA (KLOR-CON M15) 15 MEQ tablet Take 1 tablet (15 mEq total) by mouth 3 (three) times daily. 90 tablet 3  . promethazine-dextromethorphan (PROMETHAZINE-DM) 6.25-15 MG/5ML syrup Take 5 mLs by mouth 4 (four) times daily as needed for cough. 118 mL 0   No facility-administered medications prior to visit.     ROS Review of Systems  Objective:  There were no vitals taken for this visit.  BP Readings from Last 3 Encounters:  09/14/17 130/80  06/23/17 140/86  02/18/17 122/80    Wt Readings from Last 3 Encounters:  09/14/17 143 lb 4 oz (65 kg)  06/23/17 141 lb (64 kg)  02/18/17 140 lb (63.5 kg)    Physical Exam  Lab Results  Component Value Date   WBC 6.6 09/14/2017   HGB 14.1 09/14/2017   HCT 42.2 09/14/2017   PLT 261.0 09/14/2017   GLUCOSE 91 09/14/2017   CHOL 264 (H) 02/18/2017   TRIG 153.0 (H) 02/18/2017   HDL 50.70 02/18/2017   LDLCALC  183 (H) 02/18/2017   ALT 13 09/14/2017   AST 13 09/14/2017   NA 142 09/14/2017   K 3.1 (L) 09/14/2017   CL 102 09/14/2017   CREATININE 0.60 09/14/2017   BUN 9 09/14/2017   CO2 33 (H) 09/14/2017   TSH 0.65 02/18/2017   PSA WNL 10/30/2015   INR 1.1 (H) 12/31/2015   HGBA1C 6.1 02/18/2017    Dg Abd Acute W/chest  Result Date: 09/14/2017 CLINICAL DATA:  Cough and lower abdominal pain.  Diarrhea. EXAM: DG ABDOMEN ACUTE W/ 1V CHEST COMPARISON:  Chest radiograph July 16, 2016 FINDINGS: PA chest: There is no edema or consolidation. The heart size and pulmonary vascularity are normal. No adenopathy. Supine and upright abdomen: There is moderate stool throughout the colon. There is no bowel dilatation or air-fluid level to suggest bowel obstruction. No free air. There is a probable phlebolith in the right pelvis. IMPRESSION: Moderate stool throughout colon. No bowel obstruction or free air. No lung edema or consolidation. Electronically Signed   By: Lowella Grip III M.D.   On: 09/14/2017 10:44    Assessment & Plan:   There are no diagnoses linked to this encounter. I am having Julie Rivas maintain her montelukast, fluticasone, estradiol, gabapentin, lidocaine, fluticasone furoate-vilanterol, promethazine-dextromethorphan, potassium chloride SA, and albuterol.  No orders of the defined types were placed in  this encounter.    Follow-up: No Follow-up on file.  Scarlette Calico, MD

## 2017-09-16 ENCOUNTER — Other Ambulatory Visit: Payer: Self-pay | Admitting: Internal Medicine

## 2017-09-16 ENCOUNTER — Telehealth: Payer: Self-pay

## 2017-09-16 NOTE — Telephone Encounter (Signed)
[facility] sent fax stating that the 15 meq K+ is no longer available.  They have the 20 meq in stock. Please advise if okay to substitute and sig if so.

## 2017-09-16 NOTE — Telephone Encounter (Signed)
yes

## 2017-09-17 NOTE — Telephone Encounter (Signed)
PCP sent new rx for K+ 10 meq to pharmacy.

## 2017-10-14 ENCOUNTER — Ambulatory Visit: Payer: BC Managed Care – PPO | Admitting: Internal Medicine

## 2017-10-14 ENCOUNTER — Other Ambulatory Visit (INDEPENDENT_AMBULATORY_CARE_PROVIDER_SITE_OTHER): Payer: BC Managed Care – PPO

## 2017-10-14 ENCOUNTER — Ambulatory Visit (INDEPENDENT_AMBULATORY_CARE_PROVIDER_SITE_OTHER)
Admission: RE | Admit: 2017-10-14 | Discharge: 2017-10-14 | Disposition: A | Discharge status: Home / Self Care | Payer: BC Managed Care – PPO | Source: Ambulatory Visit | Attending: Internal Medicine | Admitting: Internal Medicine

## 2017-10-14 ENCOUNTER — Encounter: Payer: Self-pay | Admitting: Internal Medicine

## 2017-10-14 VITALS — BP 122/80 | HR 115 | Temp 98.7°F | Resp 20 | Ht 62.0 in | Wt 138.1 lb

## 2017-10-14 DIAGNOSIS — E876 Hypokalemia: Secondary | ICD-10-CM | POA: Diagnosis not present

## 2017-10-14 DIAGNOSIS — R059 Cough, unspecified: Secondary | ICD-10-CM

## 2017-10-14 DIAGNOSIS — R079 Chest pain, unspecified: Secondary | ICD-10-CM | POA: Diagnosis not present

## 2017-10-14 DIAGNOSIS — R05 Cough: Secondary | ICD-10-CM | POA: Diagnosis not present

## 2017-10-14 DIAGNOSIS — F320 Major depressive disorder, single episode, mild: Secondary | ICD-10-CM

## 2017-10-14 LAB — BASIC METABOLIC PANEL
BUN: 12 mg/dL (ref 6–23)
CHLORIDE: 102 meq/L (ref 96–112)
CO2: 28 meq/L (ref 19–32)
CREATININE: 0.79 mg/dL (ref 0.40–1.20)
Calcium: 9.9 mg/dL (ref 8.4–10.5)
GFR: 94.32 mL/min (ref >60.00)
Glucose, Bld: 105 mg/dL — ABNORMAL HIGH (ref 70–99)
POTASSIUM: 3.8 meq/L (ref 3.5–5.1)
Sodium: 140 mEq/L (ref 135–145)

## 2017-10-14 LAB — MAGNESIUM: Magnesium: 2.1 mg/dL (ref 1.5–2.5)

## 2017-10-14 LAB — D-DIMER, QUANTITATIVE (NOT AT ARMC): D DIMER QUANT: 0.47 ug{FEU}/mL (ref <0.50)

## 2017-10-14 MED ORDER — VORTIOXETINE HBR 5 MG PO TABS: 1.0000 | ORAL_TABLET | Freq: Every day | ORAL | 0 refills | Status: DC

## 2017-10-14 NOTE — Progress Notes (Signed)
Subjective:  Patient ID: Julie Rivas, female    DOB: Apr 15, 1956  Age: 62 y.o. MRN: [id number]  CC: Cough   HPI JONEA BUKOWSKI presents for a one-week history of cough that is productive of yellow phlegm with low-grade fever and chills.  She also complains of nasal congestion, sneezing, runny nose, and postnasal drip.  Additionally, she has had a few episodes of right-sided anterior chest pain associated with the cough.  She denies chest tightness or hemoptysis.  She also complains of worsening symptoms of depression.  About a week or 2 ago she was having passive suicidal thoughts.  She is working with a Transport planner and does not have a plan for suicide and does not intend to act on it.  She complains of insomnia, feeling hopeless and helpless, ruminations and anhedonia.  She is going through a divorce.  She has never been treated for depression before.  Outpatient Medications Prior to Visit  Medication Sig Dispense Refill  . albuterol (PROVENTIL) (2.5 MG/3ML) 0.083% nebulizer solution Take 3 mLs (2.5 mg total) by nebulization every 6 (six) hours as needed for wheezing or shortness of breath. 150 mL 2  . fluticasone (FLONASE) 50 MCG/ACT nasal spray Place 2 sprays into both nostrils daily. 16 g 11  . fluticasone furoate-vilanterol (BREO ELLIPTA) 200-25 MCG/INH AEPB Inhale 1 puff into the lungs daily. 90 each 3  . gabapentin (NEURONTIN) 300 MG capsule TAKE 1 CAPSULE AT BEDTIME. 90 capsule 3  . montelukast (SINGULAIR) 10 MG tablet Take 1 tablet (10 mg total) by mouth at bedtime. 90 tablet 3  . potassium chloride (K-DUR) 10 MEQ tablet Take 1 tablet (10 mEq total) by mouth 3 (three) times daily. 270 tablet 1  . estradiol (ESTRACE VAGINAL) 0.1 MG/GM vaginal cream Place 1 Applicatorful vaginally at bedtime. 42.5 g 3  . lidocaine (LMX) 4 % cream Apply 1 application topically 3 (three) times daily as needed. 30 g 1  . promethazine-dextromethorphan (PROMETHAZINE-DM) 6.25-15 MG/5ML syrup Take 5 mLs by  mouth 4 (four) times daily as needed for cough. 118 mL 0   No facility-administered medications prior to visit.     ROS Review of Systems  Constitutional: Positive for chills and fever. Negative for diaphoresis and fatigue.  HENT: Negative.  Negative for sore throat and trouble swallowing.   Eyes: Negative.   Respiratory: Positive for cough. Negative for choking, chest tightness, shortness of breath and wheezing.   Cardiovascular: Positive for chest pain. Negative for palpitations and leg swelling.  Gastrointestinal: Negative for abdominal pain, constipation, diarrhea, nausea and vomiting.  Endocrine: Negative.   Genitourinary: Negative.  Negative for difficulty urinating.  Musculoskeletal: Negative.  Negative for back pain and myalgias.  Skin: Negative.  Negative for rash.  Neurological: Negative.  Negative for dizziness, weakness and numbness.  Hematological: Negative for adenopathy. Does not bruise/bleed easily.  Psychiatric/Behavioral: Positive for decreased concentration, dysphoric mood, sleep disturbance and suicidal ideas. Negative for agitation, behavioral problems, confusion, hallucinations and self-injury. The patient is nervous/anxious. The patient is not hyperactive.     Objective:  BP 122/80 (BP Location: Right Arm, Patient Position: Sitting, Cuff Size: Normal)   Pulse (!) 115   Temp 98.7 F (37.1 C) (Oral)   Resp 20   Ht 5\' 2"  (1.575 m)   Wt 138 lb 1.3 oz (62.6 kg)   SpO2 99%   BMI 25.26 kg/m   BP Readings from Last 3 Encounters:  10/14/17 122/80  09/14/17 130/80  06/23/17 140/86    Wt  Readings from Last 3 Encounters:  10/14/17 138 lb 1.3 oz (62.6 kg)  09/14/17 143 lb 4 oz (65 kg)  06/23/17 141 lb (64 kg)    Physical Exam  Constitutional: She is oriented to person, place, and time. No distress.  HENT:  Mouth/Throat: Oropharynx is clear and moist. No oropharyngeal exudate.  Eyes: Conjunctivae are normal. Left eye exhibits no discharge. No scleral  icterus.  Neck: Normal range of motion. Neck supple. No JVD present. No thyromegaly present.  Cardiovascular: Normal rate, regular rhythm and normal heart sounds. Exam reveals no gallop and no friction rub.  No murmur heard. Pulmonary/Chest: Effort normal and breath sounds normal. No respiratory distress. She has no wheezes. She has no rales.  Abdominal: Soft. Bowel sounds are normal. She exhibits no distension and no mass. There is no tenderness. There is no guarding.  Musculoskeletal: Normal range of motion. She exhibits no edema, tenderness or deformity.  Lymphadenopathy:    She has no cervical adenopathy.  Neurological: She is alert and oriented to person, place, and time.  Skin: Skin is warm and dry. No rash noted. She is not diaphoretic. No erythema. No pallor.  Vitals reviewed.   Lab Results  Component Value Date   WBC 6.6 09/14/2017   HGB 14.1 09/14/2017   HCT 42.2 09/14/2017   PLT 261.0 09/14/2017   GLUCOSE 105 (H) 10/14/2017   CHOL 264 (H) 02/18/2017   TRIG 153.0 (H) 02/18/2017   HDL 50.70 02/18/2017   LDLCALC 183 (H) 02/18/2017   ALT 13 09/14/2017   AST 13 09/14/2017   NA 140 10/14/2017   K 3.8 10/14/2017   CL 102 10/14/2017   CREATININE 0.79 10/14/2017   BUN 12 10/14/2017   CO2 28 10/14/2017   TSH 0.65 02/18/2017   PSA WNL 10/30/2015   INR 1.1 (H) 12/31/2015   HGBA1C 6.1 02/18/2017    Dg Abd Acute W/chest  Result Date: 09/14/2017 CLINICAL DATA:  Cough and lower abdominal pain.  Diarrhea. EXAM: DG ABDOMEN ACUTE W/ 1V CHEST COMPARISON:  Chest radiograph July 16, 2016 FINDINGS: PA chest: There is no edema or consolidation. The heart size and pulmonary vascularity are normal. No adenopathy. Supine and upright abdomen: There is moderate stool throughout the colon. There is no bowel dilatation or air-fluid level to suggest bowel obstruction. No free air. There is a probable phlebolith in the right pelvis. IMPRESSION: Moderate stool throughout colon. No bowel  obstruction or free air. No lung edema or consolidation. Electronically Signed   By: Lowella Grip III M.D.   On: 09/14/2017 10:44    Dg Chest 2 View  Result Date: 10/15/2017 CLINICAL DATA:  Right-sided chest pain and dyspnea for 1 week EXAM: CHEST  2 VIEW COMPARISON:  09/14/2017 FINDINGS: Normal heart size and mediastinal contours. No acute infiltrate or edema. No effusion or pneumothorax. No acute osseous findings. IMPRESSION: Negative chest. Electronically Signed   By: Monte Fantasia M.D.   On: 10/15/2017 08:38    Assessment & Plan:   Susen was seen today for cough.  Diagnoses and all orders for this visit:  Right-sided chest pain- Her chest x-ray is normal and the d-dimer is negative which is reassuring that she does not have a pulmonary embolus, infiltrate, or mass.  Her pain is consistent with musculoskeletal causes related to cough.  Will control the cough. -     D-dimer, quantitative (not at [facility]); Future -     DG Chest 2 View; Future  Cough- her chest x-ray  is normal.  Her cough is consistent with a viral cause.  Will try to control the cough with Phenergan DM as needed. -     DG Chest 2 View; Future -     promethazine-dextromethorphan (PROMETHAZINE-DM) 6.25-15 MG/5ML syrup; Take 5 mLs by mouth 4 (four) times daily as needed for cough.  Hypokalemia- her potassium level is normal now.  Will continue the current potassium supplementation. -     Basic metabolic panel; Future -     Magnesium; Future  Current mild episode of major depressive disorder without prior episode ([place])- Will start treating this with Trintellix.  Will start with the 5 mg dose and anticipate increasing the dose over the next few weeks and months. -     vortioxetine HBr (TRINTELLIX) 5 MG TABS; Take 1 tablet (5 mg total) by mouth daily.   I have discontinued Chamille G. [facility]'s estradiol, lidocaine, and promethazine-dextromethorphan. I am also having her start on vortioxetine HBr and  promethazine-dextromethorphan. Additionally, I am having her maintain her montelukast, fluticasone, gabapentin, fluticasone furoate-vilanterol, albuterol, and potassium chloride.  Meds ordered this encounter  Medications  . vortioxetine HBr (TRINTELLIX) 5 MG TABS    Sig: Take 1 tablet (5 mg total) by mouth daily.    Dispense:  21 tablet    Refill:  0  . promethazine-dextromethorphan (PROMETHAZINE-DM) 6.25-15 MG/5ML syrup    Sig: Take 5 mLs by mouth 4 (four) times daily as needed for cough.    Dispense:  118 mL    Refill:  0     Follow-up: Return in about 3 weeks (around 11/04/2017).  Scarlette Calico, MD

## 2017-10-14 NOTE — Patient Instructions (Signed)
Cough, Adult  Coughing is a reflex that clears your throat and your airways. Coughing helps to heal and protect your lungs. It is normal to cough occasionally, but a cough that happens with other symptoms or lasts a long time may be a sign of a condition that needs treatment. A cough may last only 2-3 weeks (acute), or it may last longer than 8 weeks (chronic).  What are the causes?  Coughing is commonly caused by:   Breathing in substances that irritate your lungs.   A viral or bacterial respiratory infection.   Allergies.   Asthma.   Postnasal drip.   Smoking.   Acid backing up from the stomach into the esophagus (gastroesophageal reflux).   Certain medicines.   Chronic lung problems, including COPD (or rarely, lung cancer).   Other medical conditions such as heart failure.    Follow these instructions at home:  Pay attention to any changes in your symptoms. Take these actions to help with your discomfort:   Take medicines only as told by your health care provider.  ? If you were prescribed an antibiotic medicine, take it as told by your health care provider. Do not stop taking the antibiotic even if you start to feel better.  ? Talk with your health care provider before you take a cough suppressant medicine.   Drink enough fluid to keep your urine clear or pale yellow.   If the air is dry, use a cold steam vaporizer or humidifier in your bedroom or your home to help loosen secretions.   Avoid anything that causes you to cough at work or at home.   If your cough is worse at night, try sleeping in a semi-upright position.   Avoid cigarette smoke. If you smoke, quit smoking. If you need help quitting, ask your health care provider.   Avoid caffeine.   Avoid alcohol.   Rest as needed.    Contact a health care provider if:   You have new symptoms.   You cough up pus.   Your cough does not get better after 2-3 weeks, or your cough gets worse.   You cannot control your cough with suppressant  medicines and you are losing sleep.   You develop pain that is getting worse or pain that is not controlled with pain medicines.   You have a fever.   You have unexplained weight loss.   You have night sweats.  Get help right away if:   You cough up blood.   You have difficulty breathing.   Your heartbeat is very fast.  This information is not intended to replace advice given to you by your health care provider. Make sure you discuss any questions you have with your health care provider.  Document Released: 02/20/2011 Document Revised: 01/30/2016 Document Reviewed: 10/31/2014  Elsevier Interactive Patient Education  2018 [employer]

## 2017-10-15 ENCOUNTER — Encounter: Payer: Self-pay | Admitting: Internal Medicine

## 2017-10-15 MED ORDER — PROMETHAZINE-DM 6.25-15 MG/5ML PO SYRP: 5.0000 mL | ORAL_SOLUTION | Freq: Four times a day (QID) | ORAL | 0 refills | Status: DC | PRN

## 2017-10-15 NOTE — Telephone Encounter (Signed)
Pt called upset from the pharmacy because she thought she was going to be getting an ATX, she stated she knows her xray come out negetive but this has been going on for 3 weeks and she is tired of it. Was told we would call her back Monday. Please call back in regard.  She did pick up the cough syrup.

## 2017-10-18 NOTE — Telephone Encounter (Signed)
Dx for the last visit was viral. Cough syrup was sent to her pharmacy. Pt to follow up in 3 weeks and abx may be rx'ed at time if PCP recommends.

## 2017-10-19 NOTE — Telephone Encounter (Signed)
Called patient and informed of response. She understood but sounded unhappy about the response.

## 2017-10-30 ENCOUNTER — Other Ambulatory Visit: Payer: Self-pay | Admitting: Internal Medicine

## 2017-11-02 NOTE — Telephone Encounter (Deleted)
error 

## 2017-11-04 ENCOUNTER — Other Ambulatory Visit: Payer: Self-pay | Admitting: Internal Medicine

## 2017-11-04 DIAGNOSIS — F320 Major depressive disorder, single episode, mild: Secondary | ICD-10-CM

## 2017-11-04 MED ORDER — VORTIOXETINE HBR 5 MG PO TABS: 5.0000 mg | ORAL_TABLET | Freq: Every day | ORAL | 0 refills | Status: DC

## 2017-11-17 ENCOUNTER — Other Ambulatory Visit: Payer: Self-pay | Admitting: Internal Medicine

## 2017-11-17 DIAGNOSIS — Z139 Encounter for screening, unspecified: Secondary | ICD-10-CM

## 2017-11-25 ENCOUNTER — Other Ambulatory Visit: Payer: Self-pay | Admitting: Internal Medicine

## 2017-11-25 DIAGNOSIS — J4540 Moderate persistent asthma, uncomplicated: Secondary | ICD-10-CM

## 2017-11-25 DIAGNOSIS — J301 Allergic rhinitis due to pollen: Secondary | ICD-10-CM

## 2017-12-22 ENCOUNTER — Other Ambulatory Visit: Payer: Self-pay | Admitting: Internal Medicine

## 2017-12-22 DIAGNOSIS — F320 Major depressive disorder, single episode, mild: Secondary | ICD-10-CM

## 2017-12-31 ENCOUNTER — Ambulatory Visit
Admission: RE | Admit: 2017-12-31 | Discharge: 2017-12-31 | Disposition: A | Discharge status: Home / Self Care | Payer: BC Managed Care – PPO | Source: Ambulatory Visit | Attending: Internal Medicine | Admitting: Internal Medicine

## 2017-12-31 DIAGNOSIS — Z139 Encounter for screening, unspecified: Secondary | ICD-10-CM

## 2018-01-01 LAB — HM MAMMOGRAPHY

## 2018-02-22 ENCOUNTER — Other Ambulatory Visit: Payer: Self-pay | Admitting: Internal Medicine

## 2018-02-24 ENCOUNTER — Telehealth: Payer: Self-pay | Admitting: Internal Medicine

## 2018-02-24 NOTE — Telephone Encounter (Signed)
Patient has dropped off a renewal parking placard, form has been placed in providers box to approve & sign.

## 2018-02-24 NOTE — Telephone Encounter (Signed)
Patient dropped of an Application or Renewal of Disability Parking Placard to be completed by Dr Ronnald Ramp. Patient would like a call when it is done so that she can come pick it up. Placed in Brittany's box for completion.

## 2018-02-28 NOTE — Telephone Encounter (Signed)
Informed patient the formed is ready to be picked up.   Copy sent to scan.

## 2018-04-04 ENCOUNTER — Ambulatory Visit: Payer: BC Managed Care – PPO | Admitting: Internal Medicine

## 2018-04-04 ENCOUNTER — Encounter: Payer: Self-pay | Admitting: Internal Medicine

## 2018-04-04 VITALS — BP 124/82 | HR 89 | Temp 97.7°F | Ht 62.0 in | Wt 144.0 lb

## 2018-04-04 DIAGNOSIS — M25512 Pain in left shoulder: Secondary | ICD-10-CM | POA: Diagnosis not present

## 2018-04-04 DIAGNOSIS — R7303 Prediabetes: Secondary | ICD-10-CM

## 2018-04-04 DIAGNOSIS — G5603 Carpal tunnel syndrome, bilateral upper limbs: Secondary | ICD-10-CM

## 2018-04-04 MED ORDER — MELOXICAM 15 MG PO TABS: 15.0000 mg | ORAL_TABLET | Freq: Every day | ORAL | 1 refills | Status: DC | PRN

## 2018-04-04 NOTE — Assessment & Plan Note (Signed)
stable overall by history and exam, recent data reviewed with pt, and pt to continue medical treatment as before,  to f/u any worsening symptoms or concerns Lab Results  Component Value Date   HGBA1C 6.1 02/18/2017

## 2018-04-04 NOTE — Assessment & Plan Note (Signed)
Stable, cont same tx 

## 2018-04-04 NOTE — Assessment & Plan Note (Signed)
Acute on chronic, mod to severe but helped at least some with tylenol; ok for mobic 15 qd prn, refer to sports medicine in this office as most likely has DJD and/or left rot cuff tear
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
## 2018-04-04 NOTE — Patient Instructions (Signed)
Please take all new medication as prescribed - the anti-inflammatory as needed  OK to take also the tylenol if needed  Please continue all other medications as before, and refills have been done if requested.  Please have the pharmacy call with any other refills you may need.  Please continue your efforts at being more active, low cholesterol diet, and weight control.  Please keep your appointments with your specialists as you may have planned  Please make an appt with Sports Medicine in this office as you leave, as you would likely benefit from a cortisone injection

## 2018-05-06 ENCOUNTER — Other Ambulatory Visit: Payer: Self-pay | Admitting: Internal Medicine

## 2018-06-09 ENCOUNTER — Telehealth: Payer: Self-pay | Admitting: Internal Medicine

## 2018-06-09 DIAGNOSIS — M5431 Sciatica, right side: Secondary | ICD-10-CM

## 2018-06-10 MED ORDER — GABAPENTIN 300 MG PO CAPS: 300.0000 mg | ORAL_CAPSULE | Freq: Every day | ORAL | 0 refills | Status: DC

## 2018-06-10 NOTE — Telephone Encounter (Signed)
Pt called stating her medication was denied for needing appt - Dr. Ronnald Ramp next available 07/04/18 - pt is scheduled for appt - pt requesting medication to get to her appt.  [employer].

## 2018-06-10 NOTE — Addendum Note (Signed)
Addended by: Earnstine Regal on: 06/10/2018 04:08 PM   Modules accepted: Orders

## 2018-07-04 ENCOUNTER — Other Ambulatory Visit (INDEPENDENT_AMBULATORY_CARE_PROVIDER_SITE_OTHER): Payer: BC Managed Care – PPO

## 2018-07-04 ENCOUNTER — Encounter: Payer: Self-pay | Admitting: Internal Medicine

## 2018-07-04 ENCOUNTER — Ambulatory Visit: Payer: BC Managed Care – PPO | Admitting: Internal Medicine

## 2018-07-04 VITALS — BP 140/88 | HR 100 | Ht 62.0 in | Wt 146.0 lb

## 2018-07-04 DIAGNOSIS — E785 Hyperlipidemia, unspecified: Secondary | ICD-10-CM

## 2018-07-04 DIAGNOSIS — M19011 Primary osteoarthritis, right shoulder: Secondary | ICD-10-CM | POA: Insufficient documentation

## 2018-07-04 DIAGNOSIS — Z0000 Encounter for general adult medical examination without abnormal findings: Secondary | ICD-10-CM | POA: Diagnosis not present

## 2018-07-04 DIAGNOSIS — M5431 Sciatica, right side: Secondary | ICD-10-CM

## 2018-07-04 DIAGNOSIS — M19012 Primary osteoarthritis, left shoulder: Secondary | ICD-10-CM | POA: Diagnosis not present

## 2018-07-04 DIAGNOSIS — E876 Hypokalemia: Secondary | ICD-10-CM

## 2018-07-04 DIAGNOSIS — R7303 Prediabetes: Secondary | ICD-10-CM

## 2018-07-04 DIAGNOSIS — Z23 Encounter for immunization: Secondary | ICD-10-CM

## 2018-07-04 LAB — HEMOGLOBIN A1C: HEMOGLOBIN A1C: 6 % (ref 4.6–6.5)

## 2018-07-04 LAB — CBC WITH DIFFERENTIAL/PLATELET
Basophils Absolute: 0 10*3/uL (ref 0.0–0.1)
Basophils Relative: 0.4 % (ref 0.0–3.0)
EOS ABS: 0.2 10*3/uL (ref 0.0–0.7)
Eosinophils Relative: 2.5 % (ref 0.0–5.0)
HCT: 42.9 % (ref 36.0–46.0)
HEMOGLOBIN: 14.6 g/dL (ref 12.0–15.0)
LYMPHS ABS: 2.8 10*3/uL (ref 0.7–4.0)
Lymphocytes Relative: 42 % (ref 12.0–46.0)
MCHC: 34 g/dL (ref 30.0–36.0)
MCV: 94.2 fl (ref 78.0–100.0)
MONO ABS: 0.5 10*3/uL (ref 0.1–1.0)
Monocytes Relative: 7.3 % (ref 3.0–12.0)
NEUTROS PCT: 47.8 % (ref 43.0–77.0)
Neutro Abs: 3.2 10*3/uL (ref 1.4–7.7)
Platelets: 253 10*3/uL (ref 150.0–400.0)
RBC: 4.56 Mil/uL (ref 3.87–5.11)
RDW: 13.2 % (ref 11.5–15.5)
WBC: 6.7 10*3/uL (ref 4.0–10.5)

## 2018-07-04 LAB — COMPREHENSIVE METABOLIC PANEL
ALT: 18 U/L (ref 0–35)
AST: 14 U/L (ref 0–37)
Albumin: 4.2 g/dL (ref 3.5–5.2)
Alkaline Phosphatase: 114 U/L (ref 39–117)
BUN: 11 mg/dL (ref 6–23)
CHLORIDE: 103 meq/L (ref 96–112)
CO2: 28 meq/L (ref 19–32)
Calcium: 9.6 mg/dL (ref 8.4–10.5)
Creatinine, Ser: 0.67 mg/dL (ref 0.40–1.20)
GFR: 113.82 mL/min (ref >60.00)
GLUCOSE: 108 mg/dL — AB (ref 70–99)
POTASSIUM: 4 meq/L (ref 3.5–5.1)
Sodium: 140 mEq/L (ref 135–145)
Total Bilirubin: 0.2 mg/dL (ref 0.2–1.2)
Total Protein: 7.2 g/dL (ref 6.0–8.3)

## 2018-07-04 LAB — LIPID PANEL
Cholesterol: 260 mg/dL — ABNORMAL HIGH (ref 0–200)
HDL: 51.2 mg/dL (ref >39.00)
LDL CALC: 174 mg/dL — AB (ref 0–99)
NONHDL: 208.32
Total CHOL/HDL Ratio: 5
Triglycerides: 171 mg/dL — ABNORMAL HIGH (ref 0.0–149.0)
VLDL: 34.2 mg/dL (ref 0.0–40.0)

## 2018-07-04 LAB — TSH: TSH: 0.82 u[IU]/mL (ref 0.35–4.50)

## 2018-07-04 MED ORDER — DICLOFENAC 35 MG PO CAPS: 1.0000 | ORAL_CAPSULE | Freq: Three times a day (TID) | ORAL | 3 refills | Status: DC | PRN

## 2018-07-04 MED ORDER — GABAPENTIN 300 MG PO CAPS: 300.0000 mg | ORAL_CAPSULE | Freq: Every day | ORAL | 1 refills | Status: DC

## 2018-07-04 NOTE — Progress Notes (Signed)
Subjective:  Patient ID: Julie Rivas, female    DOB: 1955/12/26  Age: 62 y.o. MRN: [id number]  CC: Annual Exam; Hypertension; and Osteoarthritis   HPI Julie Rivas presents for a CPX.  She complains of chronic, worsening, bilateral shoulder pain with a decrease in the range of motion.  She is not getting much symptom relief with Tylenol.  She was seen by someone else and was diagnosed with osteoarthritis in the shoulders and meloxicam was tried.  She does not think it is helping much.  She is not willing to see an orthopedist at this time.  She tells me her blood pressure has been well controlled.  A diuretic is listed on her medications but she only takes it when she feels swelling in her fingers and toes which she has not experienced recently.  She denies any episodes of headache, blurred vision, CP, DOE, palpitations, edema, or fatigue.  She is not taking the statin.  She does not know why but she said she just never started taking it.  Outpatient Medications Prior to Visit  Medication Sig Dispense Refill  . albuterol (PROVENTIL) (2.5 MG/3ML) 0.083% nebulizer solution Take 3 mLs (2.5 mg total) by nebulization every 6 (six) hours as needed for wheezing or shortness of breath. 150 mL 2  . fluticasone (FLONASE) 50 MCG/ACT nasal spray Place 2 sprays into both nostrils daily. 16 g 11  . fluticasone furoate-vilanterol (BREO ELLIPTA) 200-25 MCG/INH AEPB Inhale 1 puff into the lungs daily. 90 each 3  . montelukast (SINGULAIR) 10 MG tablet TAKE ONE TABLET AT BEDTIME. 90 tablet 1  . potassium chloride (K-DUR) 10 MEQ tablet Take 1 tablet (10 mEq total) by mouth 3 (three) times daily. 270 tablet 1  . cyclobenzaprine (FLEXERIL) 10 MG tablet TAKE 1 TABLET UP TO THREE TIMES DAILY. 90 tablet 2  . gabapentin (NEURONTIN) 300 MG capsule Take 1 capsule (300 mg total) by mouth at bedtime. Must keep scheduled appt for future refills 30 capsule 0  . meloxicam (MOBIC) 15 MG tablet Take 1 tablet (15 mg  total) by mouth daily as needed for pain. 90 tablet 1  . promethazine-dextromethorphan (PROMETHAZINE-DM) 6.25-15 MG/5ML syrup Take 5 mLs by mouth 4 (four) times daily as needed for cough. 118 mL 0  . triamterene-hydrochlorothiazide (DYAZIDE) 37.5-25 MG capsule TAKE 1 CAPSULE EVERY DAY. 30 capsule 0  . simvastatin (ZOCOR) 20 MG tablet TAKE ONE TABLET AT BEDTIME. 90 tablet 1  . TRINTELLIX 5 MG TABS tablet TAKE 1 TABLET BY MOUTH DAILY. 90 tablet 0   No facility-administered medications prior to visit.     ROS Review of Systems  Constitutional: Negative.  Negative for appetite change, diaphoresis, fatigue and unexpected weight change.  HENT: Negative.  Negative for trouble swallowing.   Eyes: Negative for visual disturbance.  Respiratory: Negative for cough, chest tightness, shortness of breath and wheezing.   Cardiovascular: Negative for chest pain, palpitations and leg swelling.  Gastrointestinal: Negative for abdominal pain, constipation, diarrhea, nausea and vomiting.  Genitourinary: Negative.   Musculoskeletal: Positive for arthralgias and back pain. Negative for myalgias and neck pain.  Skin: Negative.  Negative for color change.  Neurological: Negative.  Negative for dizziness, weakness, light-headedness and headaches.  Hematological: Negative for adenopathy. Does not bruise/bleed easily.  Psychiatric/Behavioral: Negative.     Objective:  BP 140/88 (BP Location: Left Arm, Patient Position: Sitting, Cuff Size: Normal)   Pulse 100   Ht 5\' 2"  (1.575 m)   Wt 146 lb (66.2 kg)  SpO2 95%   BMI 26.70 kg/m   BP Readings from Last 3 Encounters:  07/04/18 140/88  04/04/18 124/82  10/14/17 122/80    Wt Readings from Last 3 Encounters:  07/04/18 146 lb (66.2 kg)  04/04/18 144 lb (65.3 kg)  10/14/17 138 lb 1.3 oz (62.6 kg)    Physical Exam  Constitutional: She is oriented to person, place, and time. No distress.  HENT:  Mouth/Throat: Oropharynx is clear and moist. No  oropharyngeal exudate.  Eyes: Conjunctivae are normal. No scleral icterus.  Neck: Normal range of motion. Neck supple. No JVD present. No thyromegaly present.  Cardiovascular: Normal rate, regular rhythm and normal heart sounds. Exam reveals no gallop.  No murmur heard. Pulmonary/Chest: Effort normal and breath sounds normal. No respiratory distress. She has no wheezes. She has no rhonchi. She has no rales.  Abdominal: Soft. Bowel sounds are normal. She exhibits no mass. There is no hepatosplenomegaly. There is no tenderness.  Musculoskeletal: She exhibits no edema or deformity.       Right shoulder: She exhibits decreased range of motion, tenderness and pain. She exhibits no bony tenderness, no swelling, no effusion, no crepitus and no deformity.       Left shoulder: She exhibits decreased range of motion, tenderness and pain. She exhibits no bony tenderness, no swelling, no effusion, no crepitus and no deformity.  Lymphadenopathy:    She has no cervical adenopathy.  Neurological: She is alert and oriented to person, place, and time.  Skin: Skin is warm and dry. She is not diaphoretic. No pallor.  Vitals reviewed.   Lab Results  Component Value Date   WBC 6.7 07/04/2018   HGB 14.6 07/04/2018   HCT 42.9 07/04/2018   PLT 253.0 07/04/2018   GLUCOSE 108 (H) 07/04/2018   CHOL 260 (H) 07/04/2018   TRIG 171.0 (H) 07/04/2018   HDL 51.20 07/04/2018   LDLCALC 174 (H) 07/04/2018   ALT 18 07/04/2018   AST 14 07/04/2018   NA 140 07/04/2018   K 4.0 07/04/2018   CL 103 07/04/2018   CREATININE 0.67 07/04/2018   BUN 11 07/04/2018   CO2 28 07/04/2018   TSH 0.82 07/04/2018   PSA WNL 10/30/2015   INR 1.1 (H) 12/31/2015   HGBA1C 6.0 07/04/2018    Mm Screening Breast Tomo Bilateral  Result Date: 12/31/2017 CLINICAL DATA:  Screening. EXAM: DIGITAL SCREENING BILATERAL MAMMOGRAM WITH TOMO AND CAD COMPARISON:  Previous exam(s). ACR Breast Density Category b: There are scattered areas of  fibroglandular density. FINDINGS: There are no findings suspicious for malignancy. Images were processed with CAD. IMPRESSION: No mammographic evidence of malignancy. A result letter of this screening mammogram will be mailed directly to the patient. RECOMMENDATION: Screening mammogram in one year. (Code:SM-B-01Y) BI-RADS CATEGORY  1: Negative. Electronically Signed   By: Dorise Bullion III M.D   On: 12/31/2017 13:52    Assessment & Plan:   Kelleen was seen today for annual exam, hypertension and osteoarthritis.  Diagnoses and all orders for this visit:  Need for influenza vaccination -     Flu Vaccine QUAD 36+ mos IM  Sciatica of right side -     gabapentin (NEURONTIN) 300 MG capsule; Take 1 capsule (300 mg total) by mouth at bedtime.  Prediabetes-her A1c is at 6.0%.  Medical therapy is not indicated.  She was encouraged to improve her lifestyle modifications. -     Hemoglobin A1c; Future  Routine general medical examination at a health care facility-exam completed, labs  reviewed, vaccines reviewed and updated, Pap/mammogram/colon cancer screening are all up-to-date, patient education material was given. -     Lipid panel; Future  Hyperlipidemia with target LDL less than 130- She is not compliant with the statin.  Her ASCVD risk score is less than 15% so at this time I do not recommend that she take a statin for CV risk reduction. -     Comprehensive metabolic panel; Future -     TSH; Future  Primary osteoarthritis of both shoulders -     Diclofenac (ZORVOLEX) 35 MG CAPS; Take 1 capsule by mouth 3 (three) times daily with meals as needed. -     CBC with Differential/Platelet; Future  Hypokalemia- Her K+ is normal now. -     Comprehensive metabolic panel; Future   I have discontinued Juel G. Bowens's promethazine-dextromethorphan, cyclobenzaprine, TRINTELLIX, simvastatin, meloxicam, and triamterene-hydrochlorothiazide. I have also changed her gabapentin. Additionally, I am having  her start on Diclofenac. Lastly, I am having her maintain her fluticasone, fluticasone furoate-vilanterol, albuterol, potassium chloride, and montelukast.  Meds ordered this encounter  Medications  . gabapentin (NEURONTIN) 300 MG capsule    Sig: Take 1 capsule (300 mg total) by mouth at bedtime.    Dispense:  90 capsule    Refill:  1  . Diclofenac (ZORVOLEX) 35 MG CAPS    Sig: Take 1 capsule by mouth 3 (three) times daily with meals as needed.    Dispense:  90 capsule    Refill:  3     Follow-up: Return in about 6 months (around 01/03/2019).  Scarlette Calico, MD

## 2018-07-04 NOTE — Patient Instructions (Signed)

## 2018-07-05 ENCOUNTER — Encounter: Payer: Self-pay | Admitting: Internal Medicine

## 2018-08-17 ENCOUNTER — Other Ambulatory Visit: Payer: Self-pay | Admitting: Internal Medicine

## 2018-08-17 DIAGNOSIS — M5431 Sciatica, right side: Secondary | ICD-10-CM

## 2018-08-22 ENCOUNTER — Other Ambulatory Visit: Payer: Self-pay | Admitting: Internal Medicine

## 2018-08-29 ENCOUNTER — Other Ambulatory Visit: Payer: Self-pay | Admitting: Internal Medicine

## 2018-08-29 NOTE — Telephone Encounter (Signed)
Refill denied - rx was dc'ed on 07/04/2018

## 2018-09-13 ENCOUNTER — Telehealth: Payer: Self-pay

## 2018-09-13 MED ORDER — LORATADINE 10 MG PO TABS: 10.0000 mg | ORAL_TABLET | Freq: Every day | ORAL | 11 refills | Status: DC

## 2018-09-13 NOTE — Telephone Encounter (Signed)
Fax requesting rx for claritin so pt may use flex spending.   Erx has been sent.

## 2018-09-21 ENCOUNTER — Ambulatory Visit: Payer: BC Managed Care – PPO | Admitting: Internal Medicine

## 2018-09-21 ENCOUNTER — Encounter: Payer: Self-pay | Admitting: Internal Medicine

## 2018-09-21 ENCOUNTER — Ambulatory Visit (INDEPENDENT_AMBULATORY_CARE_PROVIDER_SITE_OTHER)
Admission: RE | Admit: 2018-09-21 | Discharge: 2018-09-21 | Disposition: A | Discharge status: Home / Self Care | Payer: BC Managed Care – PPO | Source: Ambulatory Visit | Attending: Internal Medicine | Admitting: Internal Medicine

## 2018-09-21 VITALS — BP 146/82 | HR 80 | Temp 97.9°F | Resp 16 | Ht 62.0 in | Wt 147.5 lb

## 2018-09-21 DIAGNOSIS — M19012 Primary osteoarthritis, left shoulder: Secondary | ICD-10-CM

## 2018-09-21 DIAGNOSIS — I10 Essential (primary) hypertension: Secondary | ICD-10-CM | POA: Diagnosis not present

## 2018-09-21 DIAGNOSIS — M19011 Primary osteoarthritis, right shoulder: Secondary | ICD-10-CM

## 2018-09-21 MED ORDER — TRAMADOL HCL 50 MG PO TABS: 50.0000 mg | ORAL_TABLET | Freq: Three times a day (TID) | ORAL | 3 refills | Status: DC | PRN

## 2018-09-21 NOTE — Progress Notes (Signed)
Subjective:  Patient ID: Julie Rivas, female    DOB: 1956-03-30  Age: 62 y.o. MRN: [id number]  CC: Osteoarthritis and Hypertension   HPI Julie Rivas presents for f/up - She complains of a 1 year history of bilateral shoulder pain and decreasing range of motion.  The symptoms are worse on the left than the right.  She is not getting much symptom relief with an anti-inflammatory.  The pain keeps her awake at night and interferes with her daily activities.  Outpatient Medications Prior to Visit  Medication Sig Dispense Refill  . albuterol (PROVENTIL) (2.5 MG/3ML) 0.083% nebulizer solution Take 3 mLs (2.5 mg total) by nebulization every 6 (six) hours as needed for wheezing or shortness of breath. 150 mL 2  . Diclofenac (ZORVOLEX) 35 MG CAPS Take 1 capsule by mouth 3 (three) times daily with meals as needed. 90 capsule 3  . fluticasone (FLONASE) 50 MCG/ACT nasal spray Place 2 sprays into both nostrils daily. 16 g 11  . fluticasone furoate-vilanterol (BREO ELLIPTA) 200-25 MCG/INH AEPB Inhale 1 puff into the lungs daily. 90 each 3  . gabapentin (NEURONTIN) 300 MG capsule TAKE 1 CAPSULE AT BEDTIME. 90 capsule 3  . indomethacin (INDOCIN) 50 MG capsule TAKE 1 CAPSULE 3 TIMES DAILY WITH MEALS. 90 capsule 0  . loratadine (CLARITIN) 10 MG tablet Take 1 tablet (10 mg total) by mouth daily. 30 tablet 11  . montelukast (SINGULAIR) 10 MG tablet TAKE ONE TABLET AT BEDTIME. 90 tablet 1  . potassium chloride (K-DUR) 10 MEQ tablet Take 1 tablet (10 mEq total) by mouth 3 (three) times daily. 270 tablet 1  . triamterene-hydrochlorothiazide (DYAZIDE) 37.5-25 MG capsule Take 1 each (1 capsule total) by mouth daily. 90 capsule 1   No facility-administered medications prior to visit.     ROS Review of Systems  Constitutional: Negative for appetite change and fatigue.  HENT: Negative.   Eyes: Negative for visual disturbance.  Respiratory: Negative for cough, chest tightness, shortness of breath and  wheezing.   Cardiovascular: Negative for chest pain, palpitations and leg swelling.  Gastrointestinal: Negative for abdominal pain, constipation, diarrhea, nausea and vomiting.  Genitourinary: Negative.  Negative for difficulty urinating.  Musculoskeletal: Positive for arthralgias. Negative for back pain, myalgias and neck pain.  Skin: Negative.   Neurological: Negative.  Negative for dizziness, tremors, weakness, light-headedness, numbness and headaches.  Hematological: Negative for adenopathy. Does not bruise/bleed easily.  Psychiatric/Behavioral: Negative.     Objective:  BP (!) 146/82 (BP Location: Right Arm, Patient Position: Sitting, Cuff Size: Normal)   Pulse 80   Temp 97.9 F (36.6 C) (Oral)   Resp 16   Ht 5\' 2"  (1.575 m)   Wt 147 lb 8 oz (66.9 kg)   SpO2 99%   BMI 26.98 kg/m   BP Readings from Last 3 Encounters:  09/21/18 (!) 146/82  07/04/18 140/88  04/04/18 124/82    Wt Readings from Last 3 Encounters:  09/21/18 147 lb 8 oz (66.9 kg)  07/04/18 146 lb (66.2 kg)  04/04/18 144 lb (65.3 kg)    Physical Exam Vitals signs reviewed.  Constitutional:      Appearance: She is not ill-appearing or diaphoretic.  HENT:     Nose: Nose normal. No congestion or rhinorrhea.     Mouth/Throat:     Mouth: Mucous membranes are moist.     Pharynx: Oropharynx is clear. No oropharyngeal exudate or posterior oropharyngeal erythema.  Eyes:     General: No scleral icterus.  Conjunctiva/sclera: Conjunctivae normal.  Neck:     Musculoskeletal: Normal range of motion and neck supple.  Cardiovascular:     Rate and Rhythm: Normal rate and regular rhythm.     Heart sounds: No murmur. No gallop.   Pulmonary:     Effort: Pulmonary effort is normal.     Breath sounds: No stridor. No wheezing, rhonchi or rales.  Abdominal:     General: Bowel sounds are normal.     Palpations: There is no mass.     Tenderness: There is no abdominal tenderness. There is no guarding.  Musculoskeletal:         General: No swelling.     Right shoulder: She exhibits normal range of motion, no tenderness, no bony tenderness, no swelling, no effusion, no crepitus, no deformity and no pain.     Left shoulder: She exhibits decreased range of motion. She exhibits no tenderness, no swelling, no crepitus, no deformity, no pain and no spasm.     Right lower leg: No edema.     Left lower leg: No edema.  Skin:    General: Skin is warm.     Coloration: Skin is not pale.     Findings: No rash.  Neurological:     General: No focal deficit present.     Mental Status: She is oriented to person, place, and time. Mental status is at baseline.     Lab Results  Component Value Date   WBC 6.7 07/04/2018   HGB 14.6 07/04/2018   HCT 42.9 07/04/2018   PLT 253.0 07/04/2018   GLUCOSE 108 (H) 07/04/2018   CHOL 260 (H) 07/04/2018   TRIG 171.0 (H) 07/04/2018   HDL 51.20 07/04/2018   LDLCALC 174 (H) 07/04/2018   ALT 18 07/04/2018   AST 14 07/04/2018   NA 140 07/04/2018   K 4.0 07/04/2018   CL 103 07/04/2018   CREATININE 0.67 07/04/2018   BUN 11 07/04/2018   CO2 28 07/04/2018   TSH 0.82 07/04/2018   PSA WNL 10/30/2015   INR 1.1 (H) 12/31/2015   HGBA1C 6.0 07/04/2018    Mm Screening Breast Tomo Bilateral  Result Date: 12/31/2017 CLINICAL DATA:  Screening. EXAM: DIGITAL SCREENING BILATERAL MAMMOGRAM WITH TOMO AND CAD COMPARISON:  Previous exam(s). ACR Breast Density Category b: There are scattered areas of fibroglandular density. FINDINGS: There are no findings suspicious for malignancy. Images were processed with CAD. IMPRESSION: No mammographic evidence of malignancy. A result letter of this screening mammogram will be mailed directly to the patient. RECOMMENDATION: Screening mammogram in one year. (Code:SM-B-01Y) BI-RADS CATEGORY  1: Negative. Electronically Signed   By: Dorise Bullion III M.D   On: 12/31/2017 13:52    Dg Shoulder Right  Result Date: 09/21/2018 CLINICAL DATA:  Bilateral chronic  shoulder pain EXAM: RIGHT SHOULDER - 2+ VIEW COMPARISON:  None. FINDINGS: Suspect early osteoarthritis in the right [facility] joint. Glenohumeral joint is maintained. No acute bony abnormality. Specifically, no fracture, subluxation, or dislocation. IMPRESSION: No acute bony abnormality. Electronically Signed   By: Rolm Baptise M.D.   On: 09/21/2018 16:42   Dg Shoulder Left  Result Date: 09/21/2018 CLINICAL DATA:  Bilateral shoulder pain, chronic EXAM: LEFT SHOULDER - 2+ VIEW COMPARISON:  11/06/2014 FINDINGS: Degenerative changes in the left [facility] and glenohumeral joints with joint space narrowing and spurring. No acute bony abnormality. Specifically, no fracture, subluxation, or dislocation. IMPRESSION: Mild-to-moderate degenerative changes in the left AC and glenohumeral joints. No acute bony abnormality. Electronically Signed  By: Rolm Baptise M.D.   On: 09/21/2018 16:41    Assessment & Plan:   Markee was seen today for osteoarthritis and hypertension.  Diagnoses and all orders for this visit:  Primary osteoarthritis of both shoulders- Plain films show degenerative changes in both shoulders, worse on the left than the right.  She will continue the anti-inflammatory as needed but since she has pain that affects her daily activities she will also try tramadol as needed.  She does not want to pursue other options such as physical therapy or to see an orthopedist. -     DG Shoulder Left; Future -     DG Shoulder Right; Future -     traMADol (ULTRAM) 50 MG tablet; Take 1 tablet (50 mg total) by mouth every 8 (eight) hours as needed.  Essential hypertension- Her blood pressure is not quite adequately well controlled.  I have asked her to improve his lifestyle modifications to lower blood pressure.   I am having Gradie G. Dutan start on traMADol. I am also having her maintain her fluticasone, fluticasone furoate-vilanterol, albuterol, potassium chloride, montelukast, Diclofenac, gabapentin, indomethacin,  triamterene-hydrochlorothiazide, and loratadine.  Meds ordered this encounter  Medications  . traMADol (ULTRAM) 50 MG tablet    Sig: Take 1 tablet (50 mg total) by mouth every 8 (eight) hours as needed.    Dispense:  90 tablet    Refill:  3     Follow-up: Return in about 3 months (around 12/21/2018).  Scarlette Calico, MD

## 2018-09-21 NOTE — Patient Instructions (Signed)

## 2018-12-27 ENCOUNTER — Other Ambulatory Visit: Payer: Self-pay | Admitting: Internal Medicine

## 2019-01-31 ENCOUNTER — Other Ambulatory Visit: Payer: Self-pay | Admitting: Family Medicine

## 2019-01-31 DIAGNOSIS — Z1231 Encounter for screening mammogram for malignant neoplasm of breast: Secondary | ICD-10-CM

## 2019-03-04 ENCOUNTER — Ambulatory Visit: Payer: BC Managed Care – PPO

## 2019-03-07 ENCOUNTER — Ambulatory Visit
Admission: RE | Admit: 2019-03-07 | Discharge: 2019-03-07 | Disposition: A | Discharge status: Home / Self Care | Payer: BC Managed Care – PPO | Source: Ambulatory Visit | Attending: Family Medicine | Admitting: Family Medicine

## 2019-03-07 ENCOUNTER — Other Ambulatory Visit: Payer: Self-pay

## 2019-03-07 DIAGNOSIS — Z1231 Encounter for screening mammogram for malignant neoplasm of breast: Secondary | ICD-10-CM

## 2019-04-03 ENCOUNTER — Encounter: Payer: Self-pay | Admitting: Internal Medicine

## 2019-04-03 ENCOUNTER — Ambulatory Visit (INDEPENDENT_AMBULATORY_CARE_PROVIDER_SITE_OTHER): Payer: Medicare Other | Admitting: Internal Medicine

## 2019-04-03 ENCOUNTER — Other Ambulatory Visit: Payer: Self-pay

## 2019-04-03 DIAGNOSIS — J4540 Moderate persistent asthma, uncomplicated: Secondary | ICD-10-CM | POA: Diagnosis not present

## 2019-04-03 DIAGNOSIS — R7303 Prediabetes: Secondary | ICD-10-CM | POA: Diagnosis not present

## 2019-04-03 DIAGNOSIS — Z20828 Contact with and (suspected) exposure to other viral communicable diseases: Secondary | ICD-10-CM

## 2019-04-03 DIAGNOSIS — Z20822 Contact with and (suspected) exposure to covid-19: Secondary | ICD-10-CM

## 2019-04-03 DIAGNOSIS — J069 Acute upper respiratory infection, unspecified: Secondary | ICD-10-CM | POA: Diagnosis not present

## 2019-04-03 MED ORDER — HYDROCODONE-HOMATROPINE 5-1.5 MG/5ML PO SYRP: 5.0000 mL | ORAL_SOLUTION | Freq: Four times a day (QID) | ORAL | 0 refills | Status: AC | PRN

## 2019-04-03 MED ORDER — DOXYCYCLINE HYCLATE 100 MG PO TABS: 100.0000 mg | ORAL_TABLET | Freq: Two times a day (BID) | ORAL | 0 refills | Status: DC

## 2019-04-03 NOTE — Patient Instructions (Signed)
Please take all new medication as prescribed - the antibiotic, and cough medicine as needed  Please go to the old [facility] site for [place] testing tomorrow  Please continue all other medications as before, and refills have been done if requested.  Please have the pharmacy call with any other refills you may need.  Please keep your appointments with your specialists as you may have planned

## 2019-04-03 NOTE — Assessment & Plan Note (Signed)
stable overall by history and exam, recent data reviewed with pt, and pt to continue medical treatment as before,  to f/u any worsening symptoms or concerns  

## 2019-04-03 NOTE — Assessment & Plan Note (Signed)
Mild to mod, for doxycycline asd, cough med prn, refer COVID testing,,  to f/u any worsening symptoms or concerns

## 2019-04-03 NOTE — Progress Notes (Signed)
Patient ID: Julie Rivas, female   DOB: [birth year], 63 y.o.   MRN: [id number]  Virtual Visit via Video Note  I connected with Julie Rivas on 04/03/19 at  7:20 PM EDT by a video enabled telemedicine application and verified that I am speaking with the correct person using two identifiers.  Location: Patient: at home Provider: at home   I discussed the limitations of evaluation and management by telemedicine and the availability of in person appointments. The patient expressed understanding and agreed to proceed.  History of Present Illness:  Here with 2-3 days acute onset fever, right ear and  facial pain, pressure, headache, general weakness and malaise, and greenish d/c, with mild ST and cough, but pt denies chest pain, wheezing, increased sob or doe, orthopnea, PND, increased LE swelling, palpitations, dizziness or syncope.  Has no known COVID exposure.  Pt denies new neurological symptoms such as new headache, or facial or extremity weakness or numbness   Pt denies polydipsia, polyuria, or low sugar symptoms such as weakness or confusion improved with po intake.  Pt states overall good compliance with meds, Past Medical History:  Diagnosis Date  . Arthritis   . Asthma    Past Surgical History:  Procedure Laterality Date  . ECTOPIC PREGNANCY SURGERY     has partial right fallopian tube    reports that she has never smoked. She has never used smokeless tobacco. She reports that she does not drink alcohol or use drugs. family history includes Arthritis in her mother; Cancer in her father; Diabetes in her sister; Heart failure in her mother; Hypertension in her father. Allergies  Allergen Reactions  . Codeine Shortness Of Breath and Swelling   Current Outpatient Medications on File Prior to Visit  Medication Sig Dispense Refill  . albuterol (PROVENTIL) (2.5 MG/3ML) 0.083% nebulizer solution Take 3 mLs (2.5 mg total) by nebulization every 6 (six) hours as needed for wheezing or  shortness of breath. 150 mL 2  . cyclobenzaprine (FLEXERIL) 10 MG tablet TAKE 1 TABLET UP TO THREE TIMES DAILY. 90 tablet 2  . Diclofenac (ZORVOLEX) 35 MG CAPS Take 1 capsule by mouth 3 (three) times daily with meals as needed. 90 capsule 3  . fluticasone (FLONASE) 50 MCG/ACT nasal spray Place 2 sprays into both nostrils daily. 16 g 11  . fluticasone furoate-vilanterol (BREO ELLIPTA) 200-25 MCG/INH AEPB Inhale 1 puff into the lungs daily. 90 each 3  . gabapentin (NEURONTIN) 300 MG capsule TAKE 1 CAPSULE AT BEDTIME. 90 capsule 3  . indomethacin (INDOCIN) 50 MG capsule TAKE 1 CAPSULE 3 TIMES DAILY WITH MEALS. 90 capsule 0  . loratadine (CLARITIN) 10 MG tablet Take 1 tablet (10 mg total) by mouth daily. 30 tablet 11  . montelukast (SINGULAIR) 10 MG tablet TAKE ONE TABLET AT BEDTIME. 90 tablet 1  . potassium chloride (K-DUR) 10 MEQ tablet Take 1 tablet (10 mEq total) by mouth 3 (three) times daily. 270 tablet 1  . traMADol (ULTRAM) 50 MG tablet Take 1 tablet (50 mg total) by mouth every 8 (eight) hours as needed. 90 tablet 3  . triamterene-hydrochlorothiazide (DYAZIDE) 37.5-25 MG capsule Take 1 each (1 capsule total) by mouth daily. 90 capsule 1   No current facility-administered medications on file prior to visit.     Observations/Objective: Alert, NAD, mild ill, appropriate mood and affect, resps normal, cn 2-12 intact, moves all 4s, no visible rash or swelling Lab Results  Component Value Date   WBC 6.7 07/04/2018  HGB 14.6 07/04/2018   HCT 42.9 07/04/2018   PLT 253.0 07/04/2018   GLUCOSE 108 (H) 07/04/2018   CHOL 260 (H) 07/04/2018   TRIG 171.0 (H) 07/04/2018   HDL 51.20 07/04/2018   LDLCALC 174 (H) 07/04/2018   ALT 18 07/04/2018   AST 14 07/04/2018   NA 140 07/04/2018   K 4.0 07/04/2018   CL 103 07/04/2018   CREATININE 0.67 07/04/2018   BUN 11 07/04/2018   CO2 28 07/04/2018   TSH 0.82 07/04/2018   PSA WNL 10/30/2015   INR 1.1 (H) 12/31/2015   HGBA1C 6.0 07/04/2018    Assessment and Plan: See notes  Follow Up Instructions: See notes   I discussed the assessment and treatment plan with the patient. The patient was provided an opportunity to ask questions and all were answered. The patient agreed with the plan and demonstrated an understanding of the instructions.   The patient was advised to call back or seek an in-person evaluation if the symptoms worsen or if the condition fails to improve as anticipated.   Cathlean Cower, MD

## 2019-04-04 ENCOUNTER — Other Ambulatory Visit: Payer: Self-pay

## 2019-04-04 DIAGNOSIS — Z20822 Contact with and (suspected) exposure to covid-19: Secondary | ICD-10-CM

## 2019-04-06 ENCOUNTER — Telehealth: Payer: Self-pay

## 2019-04-06 LAB — NOVEL CORONAVIRUS, NAA: SARS-CoV-2, NAA: NOT DETECTED

## 2019-04-06 NOTE — Telephone Encounter (Signed)
-----   Message from Biagio Borg, MD sent at 04/06/2019  8:07 AM EDT ----- Left message on MyChart, pt to cont same tx   Rex Oesterle to please inform pt, COVID neg

## 2019-04-06 NOTE — Telephone Encounter (Signed)
Pt has viewed results via MyChart  

## 2019-04-27 ENCOUNTER — Other Ambulatory Visit: Payer: Self-pay

## 2019-04-27 ENCOUNTER — Encounter: Payer: Self-pay | Admitting: Internal Medicine

## 2019-04-27 ENCOUNTER — Ambulatory Visit (INDEPENDENT_AMBULATORY_CARE_PROVIDER_SITE_OTHER): Payer: Medicare Other | Admitting: Internal Medicine

## 2019-04-27 VITALS — BP 120/80 | HR 97 | Temp 98.0°F | Resp 16 | Ht 62.0 in | Wt 147.0 lb

## 2019-04-27 DIAGNOSIS — L98491 Non-pressure chronic ulcer of skin of other sites limited to breakdown of skin: Secondary | ICD-10-CM | POA: Insufficient documentation

## 2019-04-27 NOTE — Patient Instructions (Signed)

## 2019-04-27 NOTE — Progress Notes (Signed)
Subjective:  Patient ID: Julie Rivas, female    DOB: 12-Oct-1955  Age: 63 y.o. MRN: [id number]  CC: Callouses   HPI MALORY SPURR presents for f/up - She complains that she has developed a callus on her left foot and she does not know what to do about it.  Outpatient Medications Prior to Visit  Medication Sig Dispense Refill  . albuterol (PROVENTIL) (2.5 MG/3ML) 0.083% nebulizer solution Take 3 mLs (2.5 mg total) by nebulization every 6 (six) hours as needed for wheezing or shortness of breath. 150 mL 2  . cyclobenzaprine (FLEXERIL) 10 MG tablet TAKE 1 TABLET UP TO THREE TIMES DAILY. 90 tablet 2  . Diclofenac (ZORVOLEX) 35 MG CAPS Take 1 capsule by mouth 3 (three) times daily with meals as needed. 90 capsule 3  . doxycycline (VIBRA-TABS) 100 MG tablet Take 1 tablet (100 mg total) by mouth 2 (two) times daily. 20 tablet 0  . fluticasone (FLONASE) 50 MCG/ACT nasal spray Place 2 sprays into both nostrils daily. 16 g 11  . fluticasone furoate-vilanterol (BREO ELLIPTA) 200-25 MCG/INH AEPB Inhale 1 puff into the lungs daily. 90 each 3  . gabapentin (NEURONTIN) 300 MG capsule TAKE 1 CAPSULE AT BEDTIME. 90 capsule 3  . indomethacin (INDOCIN) 50 MG capsule TAKE 1 CAPSULE 3 TIMES DAILY WITH MEALS. 90 capsule 0  . loratadine (CLARITIN) 10 MG tablet Take 1 tablet (10 mg total) by mouth daily. 30 tablet 11  . montelukast (SINGULAIR) 10 MG tablet TAKE ONE TABLET AT BEDTIME. 90 tablet 1  . potassium chloride (K-DUR) 10 MEQ tablet Take 1 tablet (10 mEq total) by mouth 3 (three) times daily. 270 tablet 1  . traMADol (ULTRAM) 50 MG tablet Take 1 tablet (50 mg total) by mouth every 8 (eight) hours as needed. 90 tablet 3  . triamterene-hydrochlorothiazide (DYAZIDE) 37.5-25 MG capsule Take 1 each (1 capsule total) by mouth daily. 90 capsule 1   No facility-administered medications prior to visit.     ROS Review of Systems  All other systems reviewed and are negative.   Objective:  BP 120/80  (BP Location: Left Arm, Patient Position: Sitting, Cuff Size: Normal)   Pulse 97   Temp 98 F (36.7 C) (Oral)   Resp 16   Ht 5\' 2"  (1.575 m)   Wt 147 lb (66.7 kg)   SpO2 97%   BMI 26.89 kg/m   BP Readings from Last 3 Encounters:  04/27/19 120/80  09/21/18 (!) 146/82  07/04/18 140/88    Wt Readings from Last 3 Encounters:  04/27/19 147 lb (66.7 kg)  09/21/18 147 lb 8 oz (66.9 kg)  07/04/18 146 lb (66.2 kg)    Physical Exam Cardiovascular:     Pulses:          Dorsalis pedis pulses are 1+ on the right side and 1+ on the left side.       Posterior tibial pulses are 1+ on the right side and 1+ on the left side.  Feet:     Right foot:     Skin integrity: Skin integrity normal.     Toenail Condition: Right toenails are normal.     Left foot:     Skin integrity: Callus present. No ulcer, blister or erythema.     Comments: See photo    Lab Results  Component Value Date   WBC 6.7 07/04/2018   HGB 14.6 07/04/2018   HCT 42.9 07/04/2018   PLT 253.0 07/04/2018  GLUCOSE 108 (H) 07/04/2018   CHOL 260 (H) 07/04/2018   TRIG 171.0 (H) 07/04/2018   HDL 51.20 07/04/2018   LDLCALC 174 (H) 07/04/2018   ALT 18 07/04/2018   AST 14 07/04/2018   NA 140 07/04/2018   K 4.0 07/04/2018   CL 103 07/04/2018   CREATININE 0.67 07/04/2018   BUN 11 07/04/2018   CO2 28 07/04/2018   TSH 0.82 07/04/2018   PSA WNL 10/30/2015   INR 1.1 (H) 12/31/2015   HGBA1C 6.0 07/04/2018    Mm 3d Screen Breast Bilateral  Result Date: 03/07/2019 CLINICAL DATA:  Screening. EXAM: DIGITAL SCREENING BILATERAL MAMMOGRAM WITH TOMO AND CAD COMPARISON:  Previous exam(s). ACR Breast Density Category b: There are scattered areas of fibroglandular density. FINDINGS: There are no findings suspicious for malignancy. Images were processed with CAD. IMPRESSION: No mammographic evidence of malignancy. A result letter of this screening mammogram will be mailed directly to the patient. RECOMMENDATION: Screening mammogram in  one year. (Code:SM-B-01Y) BI-RADS CATEGORY  1: Negative. Electronically Signed   By: Marin Olp M.D.   On: 03/07/2019 17:20    Assessment & Plan:   Katori was seen today for callouses.  Diagnoses and all orders for this visit:  Callous ulcer, limited to breakdown of skin ([place])- I have asked her to see podiatry to have this worked on. -     Ambulatory referral to Podiatry   I am having Julie Rivas maintain her fluticasone, fluticasone furoate-vilanterol, albuterol, potassium chloride, montelukast, Diclofenac, gabapentin, indomethacin, triamterene-hydrochlorothiazide, loratadine, traMADol, cyclobenzaprine, and doxycycline.  No orders of the defined types were placed in this encounter.    Follow-up: Return if symptoms worsen or fail to improve.  Scarlette Calico, MD

## 2019-05-05 ENCOUNTER — Ambulatory Visit (INDEPENDENT_AMBULATORY_CARE_PROVIDER_SITE_OTHER): Payer: BC Managed Care – PPO | Admitting: Podiatry

## 2019-05-05 ENCOUNTER — Other Ambulatory Visit: Payer: Self-pay

## 2019-05-05 VITALS — Temp 98.1°F

## 2019-05-05 DIAGNOSIS — B070 Plantar wart: Secondary | ICD-10-CM | POA: Diagnosis not present

## 2019-05-05 DIAGNOSIS — Q828 Other specified congenital malformations of skin: Secondary | ICD-10-CM

## 2019-05-05 NOTE — Progress Notes (Signed)
Subjective:  Patient ID: Julie Rivas, female    DOB: 05-10-1956,  MRN: [id number]  Chief Complaint  Patient presents with  . Foot Problem    Pt states painful lesion on plantar foot sub 4th digit. Pt states 1 year duration, only painful recently, and denies drainage. Denies fever/nausea/vomiting/chills. Pt states no known injury.    63 y.o. female presents with the above complaint. Hx as above.   Review of Systems: Negative except as noted in the HPI. Denies N/V/F/Ch.  Past Medical History:  Diagnosis Date  . Arthritis   . Asthma     Current Outpatient Medications:  .  albuterol (PROVENTIL) (2.5 MG/3ML) 0.083% nebulizer solution, Take 3 mLs (2.5 mg total) by nebulization every 6 (six) hours as needed for wheezing or shortness of breath., Disp: 150 mL, Rfl: 2 .  cyclobenzaprine (FLEXERIL) 10 MG tablet, TAKE 1 TABLET UP TO THREE TIMES DAILY., Disp: 90 tablet, Rfl: 2 .  Diclofenac (ZORVOLEX) 35 MG CAPS, Take 1 capsule by mouth 3 (three) times daily with meals as needed., Disp: 90 capsule, Rfl: 3 .  fluticasone (FLONASE) 50 MCG/ACT nasal spray, Place 2 sprays into both nostrils daily., Disp: 16 g, Rfl: 11 .  fluticasone furoate-vilanterol (BREO ELLIPTA) 200-25 MCG/INH AEPB, Inhale 1 puff into the lungs daily., Disp: 90 each, Rfl: 3 .  gabapentin (NEURONTIN) 300 MG capsule, TAKE 1 CAPSULE AT BEDTIME., Disp: 90 capsule, Rfl: 3 .  indomethacin (INDOCIN) 50 MG capsule, TAKE 1 CAPSULE 3 TIMES DAILY WITH MEALS., Disp: 90 capsule, Rfl: 0 .  loratadine (CLARITIN) 10 MG tablet, Take 1 tablet (10 mg total) by mouth daily., Disp: 30 tablet, Rfl: 11 .  montelukast (SINGULAIR) 10 MG tablet, TAKE ONE TABLET AT BEDTIME., Disp: 90 tablet, Rfl: 1 .  potassium chloride (K-DUR) 10 MEQ tablet, Take 1 tablet (10 mEq total) by mouth 3 (three) times daily., Disp: 270 tablet, Rfl: 1 .  traMADol (ULTRAM) 50 MG tablet, Take 1 tablet (50 mg total) by mouth every 8 (eight) hours as needed., Disp: 90 tablet, Rfl:  3 .  triamterene-hydrochlorothiazide (DYAZIDE) 37.5-25 MG capsule, Take 1 each (1 capsule total) by mouth daily., Disp: 90 capsule, Rfl: 1 .  doxycycline (VIBRA-TABS) 100 MG tablet, Take 1 tablet (100 mg total) by mouth 2 (two) times daily. (Patient not taking: Reported on 05/05/2019), Disp: 20 tablet, Rfl: 0  Social History   Tobacco Use  Smoking Status Never Smoker  Smokeless Tobacco Never Used    Allergies  Allergen Reactions  . Codeine Shortness Of Breath and Swelling   Objective:   Vitals:   05/05/19 0835  Temp: 98.1 F (36.7 C)   There is no height or weight on file to calculate BMI. Constitutional Well developed. Well nourished.  Vascular Dorsalis pedis pulses palpable bilaterally. Posterior tibial pulses palpable bilaterally. Capillary refill normal to all digits.  No cyanosis or clubbing noted. Pedal hair growth normal.  Neurologic Normal speech. Oriented to person, place, and time. Epicritic sensation to light touch grossly present bilaterally.  Dermatologic Nails normal Skin punctate hyperkeratosis sub-met 4 left  Orthopedic: Normal joint ROM without pain or crepitus bilaterally. No visible deformities. No bony tenderness.   Radiographs: None Assessment:   1. Plantar verruca   2. Porokeratosis    Plan:  Patient was evaluated and treated and all questions answered.  Porokeratosis Submet 4 L  -Debrided as below destroyed with lidocaine  Procedure: Destruction of Lesion Location: submet 4 left Anesthesia: none Instrumentation: 15 blade. Technique: Debridement  of lesion. Aperture pad applied around lesion. Small amount of canthrone applied to the base of the lesion. Dressing: Dry, sterile, compression dressing. Disposition: Patient tolerated procedure well. Advised to leave dressing on for 6-8 hours. Thereafter patient to wash the area with soap and water and applied band-aid. Off-loading pads dispensed. Patient to return in 2 weeks for follow-up.    Return if symptoms worsen or fail to improve.

## 2019-05-18 ENCOUNTER — Ambulatory Visit: Payer: Self-pay

## 2019-05-18 ENCOUNTER — Other Ambulatory Visit: Payer: Self-pay

## 2019-05-18 ENCOUNTER — Ambulatory Visit (INDEPENDENT_AMBULATORY_CARE_PROVIDER_SITE_OTHER): Payer: BC Managed Care – PPO

## 2019-05-18 ENCOUNTER — Ambulatory Visit (INDEPENDENT_AMBULATORY_CARE_PROVIDER_SITE_OTHER): Payer: BC Managed Care – PPO | Admitting: Podiatry

## 2019-05-18 DIAGNOSIS — M722 Plantar fascial fibromatosis: Secondary | ICD-10-CM

## 2019-05-18 DIAGNOSIS — M7752 Other enthesopathy of left foot: Secondary | ICD-10-CM

## 2019-05-18 NOTE — Progress Notes (Signed)
Subjective:  Patient ID: Julie Rivas, female    DOB: 1956/01/26,  MRN: [id number]  Chief Complaint  Patient presents with  . Callouses    painful callus lesion fouth submet left - still having pain after debridement   . Foot Pain    heel pain left - acute x 1 week - she has been compensating when she walks because of the callus; and now her heel is so painful she can barely walk    63 y.o. female presents with the above complaint. Hx as above.  Review of Systems: Negative except as noted in the HPI. Denies N/V/F/Ch.  Past Medical History:  Diagnosis Date  . Arthritis   . Asthma     Current Outpatient Medications:  .  albuterol (PROVENTIL) (2.5 MG/3ML) 0.083% nebulizer solution, Take 3 mLs (2.5 mg total) by nebulization every 6 (six) hours as needed for wheezing or shortness of breath., Disp: 150 mL, Rfl: 2 .  cyclobenzaprine (FLEXERIL) 10 MG tablet, TAKE 1 TABLET UP TO THREE TIMES DAILY., Disp: 90 tablet, Rfl: 2 .  Diclofenac (ZORVOLEX) 35 MG CAPS, Take 1 capsule by mouth 3 (three) times daily with meals as needed., Disp: 90 capsule, Rfl: 3 .  doxycycline (VIBRA-TABS) 100 MG tablet, Take 1 tablet (100 mg total) by mouth 2 (two) times daily. (Patient not taking: Reported on 05/05/2019), Disp: 20 tablet, Rfl: 0 .  fluticasone (FLONASE) 50 MCG/ACT nasal spray, Place 2 sprays into both nostrils daily., Disp: 16 g, Rfl: 11 .  fluticasone furoate-vilanterol (BREO ELLIPTA) 200-25 MCG/INH AEPB, Inhale 1 puff into the lungs daily., Disp: 90 each, Rfl: 3 .  gabapentin (NEURONTIN) 300 MG capsule, TAKE 1 CAPSULE AT BEDTIME., Disp: 90 capsule, Rfl: 3 .  indomethacin (INDOCIN) 50 MG capsule, TAKE 1 CAPSULE 3 TIMES DAILY WITH MEALS., Disp: 90 capsule, Rfl: 0 .  loratadine (CLARITIN) 10 MG tablet, Take 1 tablet (10 mg total) by mouth daily., Disp: 30 tablet, Rfl: 11 .  montelukast (SINGULAIR) 10 MG tablet, TAKE ONE TABLET AT BEDTIME., Disp: 90 tablet, Rfl: 1 .  potassium chloride (K-DUR) 10 MEQ  tablet, Take 1 tablet (10 mEq total) by mouth 3 (three) times daily., Disp: 270 tablet, Rfl: 1 .  traMADol (ULTRAM) 50 MG tablet, Take 1 tablet (50 mg total) by mouth every 8 (eight) hours as needed., Disp: 90 tablet, Rfl: 3 .  triamterene-hydrochlorothiazide (DYAZIDE) 37.5-25 MG capsule, Take 1 each (1 capsule total) by mouth daily., Disp: 90 capsule, Rfl: 1  Social History   Tobacco Use  Smoking Status Never Smoker  Smokeless Tobacco Never Used    Allergies  Allergen Reactions  . Codeine Shortness Of Breath and Swelling   Objective:  There were no vitals filed for this visit. There is no height or weight on file to calculate BMI. Constitutional Well developed. Well nourished.  Vascular Dorsalis pedis pulses palpable bilaterally. Posterior tibial pulses palpable bilaterally. Capillary refill normal to all digits.  No cyanosis or clubbing noted. Pedal hair growth normal.  Neurologic Normal speech. Oriented to person, place, and time. Epicritic sensation to light touch grossly present bilaterally.  Dermatologic Nails well groomed and normal in appearance. No open wounds. Punctate hyperkeratosis left 4th   Orthopedic: Normal joint ROM without pain or crepitus bilaterally. No visible deformities. Tender to palpation at the calcaneal tuber left. No pain with calcaneal squeeze left. Ankle ROM diminished range of motion left. Silfverskiold Test: positive left.  POP Left 4th MPJ   Radiographs: Taken and reviewed. No  acute fractures or dislocations. No evidence of stress fracture.  Plantar heel spur present. Posterior heel spur absent.   Assessment:   1. Plantar fasciitis of left foot   2. Capsulitis of metatarsophalangeal (MTP) joint of left foot    Plan:  Patient was evaluated and treated and all questions answered.  Plantar Fasciitis, left - XR reviewed as above.  - Educated on icing and stretching. Instructions given.  - Injection delivered to the plantar fascia as  below. - DME: PF brace  dispensed.  Procedure: Injection Tendon/Ligament Location: Left plantar fascia at the glabrous junction; medial approach. Skin Prep: alcohol Injectate: 1 cc 0.5% marcaine plain, 1 cc dexamethasone phosphate, 0.5 cc kenalog 10. Disposition: Patient tolerated procedure well. Injection site dressed with a band-aid.  Porokeratosis -Continue offloading.  Return in about 3 weeks (around 06/08/2019) for Plantar fasciitis, Left.

## 2019-06-08 ENCOUNTER — Ambulatory Visit (INDEPENDENT_AMBULATORY_CARE_PROVIDER_SITE_OTHER): Payer: Medicare Other | Admitting: Podiatry

## 2019-06-08 ENCOUNTER — Other Ambulatory Visit: Payer: Self-pay

## 2019-06-08 DIAGNOSIS — M722 Plantar fascial fibromatosis: Secondary | ICD-10-CM | POA: Diagnosis not present

## 2019-06-08 NOTE — Progress Notes (Signed)
Subjective:  Patient ID: Julie Rivas, female    DOB: 1956/05/23,  MRN: [id number]  Chief Complaint  Patient presents with  . Plantar Fasciitis    Pt states left plantar fasciitis is still painful, injection ineffective, and brace only helps a little.    63 y.o. female presents with the above complaint. Hx as above.  Review of Systems: Negative except as noted in the HPI. Denies N/V/F/Ch.  Past Medical History:  Diagnosis Date  . Arthritis   . Asthma     Current Outpatient Medications:  .  albuterol (PROVENTIL) (2.5 MG/3ML) 0.083% nebulizer solution, Take 3 mLs (2.5 mg total) by nebulization every 6 (six) hours as needed for wheezing or shortness of breath., Disp: 150 mL, Rfl: 2 .  cyclobenzaprine (FLEXERIL) 10 MG tablet, TAKE 1 TABLET UP TO THREE TIMES DAILY., Disp: 90 tablet, Rfl: 2 .  Diclofenac (ZORVOLEX) 35 MG CAPS, Take 1 capsule by mouth 3 (three) times daily with meals as needed., Disp: 90 capsule, Rfl: 3 .  doxycycline (VIBRA-TABS) 100 MG tablet, Take 1 tablet (100 mg total) by mouth 2 (two) times daily., Disp: 20 tablet, Rfl: 0 .  fluticasone (FLONASE) 50 MCG/ACT nasal spray, Place 2 sprays into both nostrils daily., Disp: 16 g, Rfl: 11 .  fluticasone furoate-vilanterol (BREO ELLIPTA) 200-25 MCG/INH AEPB, Inhale 1 puff into the lungs daily., Disp: 90 each, Rfl: 3 .  gabapentin (NEURONTIN) 300 MG capsule, TAKE 1 CAPSULE AT BEDTIME., Disp: 90 capsule, Rfl: 3 .  indomethacin (INDOCIN) 50 MG capsule, TAKE 1 CAPSULE 3 TIMES DAILY WITH MEALS., Disp: 90 capsule, Rfl: 0 .  loratadine (CLARITIN) 10 MG tablet, Take 1 tablet (10 mg total) by mouth daily., Disp: 30 tablet, Rfl: 11 .  montelukast (SINGULAIR) 10 MG tablet, TAKE ONE TABLET AT BEDTIME., Disp: 90 tablet, Rfl: 1 .  potassium chloride (K-DUR) 10 MEQ tablet, Take 1 tablet (10 mEq total) by mouth 3 (three) times daily., Disp: 270 tablet, Rfl: 1 .  traMADol (ULTRAM) 50 MG tablet, Take 1 tablet (50 mg total) by mouth every 8  (eight) hours as needed., Disp: 90 tablet, Rfl: 3 .  triamterene-hydrochlorothiazide (DYAZIDE) 37.5-25 MG capsule, Take 1 each (1 capsule total) by mouth daily., Disp: 90 capsule, Rfl: 1  Social History   Tobacco Use  Smoking Status Never Smoker  Smokeless Tobacco Never Used    Allergies  Allergen Reactions  . Codeine Shortness Of Breath and Swelling   Objective:  There were no vitals filed for this visit. There is no height or weight on file to calculate BMI. Constitutional Well developed. Well nourished.  Vascular Dorsalis pedis pulses palpable bilaterally. Posterior tibial pulses palpable bilaterally. Capillary refill normal to all digits.  No cyanosis or clubbing noted. Pedal hair growth normal.  Neurologic Normal speech. Oriented to person, place, and time. Epicritic sensation to light touch grossly present bilaterally.  Dermatologic Nails well groomed and normal in appearance. No open wounds. Punctate hyperkeratosis left 4th   Orthopedic: Normal joint ROM without pain or crepitus bilaterally. No visible deformities. Tender to palpation at the calcaneal tuber left. No pain with calcaneal squeeze left. Ankle ROM diminished range of motion left. Silfverskiold Test: positive left.  POP Left 4th MPJ   Radiographs: None  Assessment:   1. Plantar fasciitis of left foot    Plan:  Patient was evaluated and treated and all questions answered.  Plantar Fasciitis, left -Continue stretching and icing injection offered but deferred. - DME: Night splint dispensed -Follow-up in  6 weeks for recheck  No follow-ups on file.

## 2019-07-20 ENCOUNTER — Ambulatory Visit (INDEPENDENT_AMBULATORY_CARE_PROVIDER_SITE_OTHER): Payer: BC Managed Care – PPO | Admitting: Podiatry

## 2019-07-20 ENCOUNTER — Other Ambulatory Visit: Payer: Self-pay | Admitting: Internal Medicine

## 2019-07-20 ENCOUNTER — Ambulatory Visit (INDEPENDENT_AMBULATORY_CARE_PROVIDER_SITE_OTHER): Payer: Medicare Other

## 2019-07-20 ENCOUNTER — Telehealth: Payer: Self-pay

## 2019-07-20 ENCOUNTER — Other Ambulatory Visit: Payer: Self-pay

## 2019-07-20 ENCOUNTER — Other Ambulatory Visit: Payer: Self-pay | Admitting: Podiatry

## 2019-07-20 DIAGNOSIS — M722 Plantar fascial fibromatosis: Secondary | ICD-10-CM

## 2019-07-20 DIAGNOSIS — M79671 Pain in right foot: Secondary | ICD-10-CM

## 2019-07-20 DIAGNOSIS — M19011 Primary osteoarthritis, right shoulder: Secondary | ICD-10-CM

## 2019-07-20 MED ORDER — INDOMETHACIN 50 MG PO CAPS: ORAL_CAPSULE | ORAL | 2 refills | Status: DC

## 2019-07-20 NOTE — Telephone Encounter (Signed)
lvm informing pt that rx has been sent in.

## 2019-07-20 NOTE — Telephone Encounter (Signed)
RX sent

## 2019-07-20 NOTE — Telephone Encounter (Signed)
Pt is requesting an alternative to the zorvolex. Pt states that it is too expensive. Pt request it to be sent to [facility].   Please advise.

## 2019-07-25 ENCOUNTER — Encounter: Payer: Self-pay | Admitting: Internal Medicine

## 2019-07-25 ENCOUNTER — Other Ambulatory Visit: Payer: Self-pay

## 2019-07-25 ENCOUNTER — Ambulatory Visit (INDEPENDENT_AMBULATORY_CARE_PROVIDER_SITE_OTHER): Payer: BC Managed Care – PPO | Admitting: Internal Medicine

## 2019-07-25 ENCOUNTER — Other Ambulatory Visit (INDEPENDENT_AMBULATORY_CARE_PROVIDER_SITE_OTHER): Payer: Medicare Other

## 2019-07-25 VITALS — BP 130/98 | HR 90 | Temp 98.0°F | Resp 16 | Ht 62.0 in | Wt 143.5 lb

## 2019-07-25 DIAGNOSIS — E785 Hyperlipidemia, unspecified: Secondary | ICD-10-CM

## 2019-07-25 DIAGNOSIS — I10 Essential (primary) hypertension: Secondary | ICD-10-CM | POA: Diagnosis not present

## 2019-07-25 DIAGNOSIS — R7303 Prediabetes: Secondary | ICD-10-CM

## 2019-07-25 DIAGNOSIS — Z0000 Encounter for general adult medical examination without abnormal findings: Secondary | ICD-10-CM | POA: Diagnosis not present

## 2019-07-25 DIAGNOSIS — J370 Chronic laryngitis: Secondary | ICD-10-CM | POA: Diagnosis not present

## 2019-07-25 DIAGNOSIS — D751 Secondary polycythemia: Secondary | ICD-10-CM

## 2019-07-25 LAB — HEPATIC FUNCTION PANEL
ALT: 18 U/L (ref 0–35)
AST: 13 U/L (ref 0–37)
Albumin: 4.6 g/dL (ref 3.5–5.2)
Alkaline Phosphatase: 129 U/L — ABNORMAL HIGH (ref 39–117)
Bilirubin, Direct: 0.1 mg/dL (ref 0.0–0.3)
Total Bilirubin: 0.6 mg/dL (ref 0.2–1.2)
Total Protein: 8 g/dL (ref 6.0–8.3)

## 2019-07-25 LAB — BASIC METABOLIC PANEL
BUN: 17 mg/dL (ref 6–23)
CO2: 30 mEq/L (ref 19–32)
Calcium: 10 mg/dL (ref 8.4–10.5)
Chloride: 99 mEq/L (ref 96–112)
Creatinine, Ser: 0.86 mg/dL (ref 0.40–1.20)
GFR: 80.02 mL/min (ref >60.00)
Glucose, Bld: 111 mg/dL — ABNORMAL HIGH (ref 70–99)
Potassium: 4.3 mEq/L (ref 3.5–5.1)
Sodium: 138 mEq/L (ref 135–145)

## 2019-07-25 LAB — URINALYSIS, ROUTINE W REFLEX MICROSCOPIC
Bilirubin Urine: NEGATIVE
Ketones, ur: NEGATIVE
Leukocytes,Ua: NEGATIVE
Nitrite: NEGATIVE
Specific Gravity, Urine: 1.025 (ref 1.000–1.030)
Total Protein, Urine: NEGATIVE
Urine Glucose: NEGATIVE
Urobilinogen, UA: 0.2 (ref 0.0–1.0)
pH: 5.5 (ref 5.0–8.0)

## 2019-07-25 LAB — CBC WITH DIFFERENTIAL/PLATELET
Basophils Absolute: 0.1 10*3/uL (ref 0.0–0.1)
Basophils Relative: 1.2 % (ref 0.0–3.0)
Eosinophils Absolute: 0.1 10*3/uL (ref 0.0–0.7)
Eosinophils Relative: 0.9 % (ref 0.0–5.0)
HCT: 48.9 % — ABNORMAL HIGH (ref 36.0–46.0)
Hemoglobin: 16.6 g/dL — ABNORMAL HIGH (ref 12.0–15.0)
Lymphocytes Relative: 33.5 % (ref 12.0–46.0)
Lymphs Abs: 2.6 10*3/uL (ref 0.7–4.0)
MCHC: 33.9 g/dL (ref 30.0–36.0)
MCV: 93.9 fl (ref 78.0–100.0)
Monocytes Absolute: 0.6 10*3/uL (ref 0.1–1.0)
Monocytes Relative: 7.2 % (ref 3.0–12.0)
Neutro Abs: 4.4 10*3/uL (ref 1.4–7.7)
Neutrophils Relative %: 57.2 % (ref 43.0–77.0)
Platelets: 284 10*3/uL (ref 150.0–400.0)
RBC: 5.21 Mil/uL — ABNORMAL HIGH (ref 3.87–5.11)
RDW: 13.3 % (ref 11.5–15.5)
WBC: 7.7 10*3/uL (ref 4.0–10.5)

## 2019-07-25 LAB — TSH: TSH: 1.09 u[IU]/mL (ref 0.35–4.50)

## 2019-07-25 LAB — LIPID PANEL
Cholesterol: 290 mg/dL — ABNORMAL HIGH (ref 0–200)
HDL: 55.2 mg/dL (ref >39.00)
LDL Cholesterol: 205 mg/dL — ABNORMAL HIGH (ref 0–99)
NonHDL: 234.64
Total CHOL/HDL Ratio: 5
Triglycerides: 149 mg/dL (ref 0.0–149.0)
VLDL: 29.8 mg/dL (ref 0.0–40.0)

## 2019-07-25 LAB — HEMOGLOBIN A1C: Hgb A1c MFr Bld: 6.3 % (ref 4.6–6.5)

## 2019-07-25 MED ORDER — ROSUVASTATIN CALCIUM 20 MG PO TABS: 20.0000 mg | ORAL_TABLET | Freq: Every day | ORAL | 1 refills | Status: DC

## 2019-07-25 MED ORDER — INDAPAMIDE 1.25 MG PO TABS: 1.2500 mg | ORAL_TABLET | Freq: Every day | ORAL | 0 refills | Status: DC

## 2019-07-25 NOTE — Patient Instructions (Signed)

## 2019-07-25 NOTE — Progress Notes (Signed)
Subjective:  Patient ID: Julie Rivas, female    DOB: 12/22/1955  Age: 63 y.o. MRN: [id number]  CC: Annual Exam, Hypertension, and Hyperlipidemia   HPI DACHE MALMGREN presents for a CPX  She ran out of her antihypertensive diuretic about 3 months ago.  She has not been monitoring her blood pressure but she does complain of intermittent episodes of headache and dizziness.  She also complains of a 40-month history of laryngitis.  She denies chest pain, shortness of breath, odynophagia, or dysphagia.  Outpatient Medications Prior to Visit  Medication Sig Dispense Refill  . albuterol (PROVENTIL) (2.5 MG/3ML) 0.083% nebulizer solution Take 3 mLs (2.5 mg total) by nebulization every 6 (six) hours as needed for wheezing or shortness of breath. 150 mL 2  . cyclobenzaprine (FLEXERIL) 10 MG tablet TAKE 1 TABLET UP TO THREE TIMES DAILY. 90 tablet 2  . fluticasone (FLONASE) 50 MCG/ACT nasal spray Place 2 sprays into both nostrils daily. 16 g 11  . fluticasone furoate-vilanterol (BREO ELLIPTA) 200-25 MCG/INH AEPB Inhale 1 puff into the lungs daily. 90 each 3  . gabapentin (NEURONTIN) 300 MG capsule TAKE 1 CAPSULE AT BEDTIME. 90 capsule 3  . loratadine (CLARITIN) 10 MG tablet Take 1 tablet (10 mg total) by mouth daily. 30 tablet 11  . montelukast (SINGULAIR) 10 MG tablet TAKE ONE TABLET AT BEDTIME. 90 tablet 1  . traMADol (ULTRAM) 50 MG tablet Take 1 tablet (50 mg total) by mouth every 8 (eight) hours as needed. 90 tablet 3  . indomethacin (INDOCIN) 50 MG capsule TAKE 1 CAPSULE 3 TIMES DAILY WITH MEALS. 90 capsule 2  . potassium chloride (K-DUR) 10 MEQ tablet Take 1 tablet (10 mEq total) by mouth 3 (three) times daily. 270 tablet 1  . triamterene-hydrochlorothiazide (DYAZIDE) 37.5-25 MG capsule Take 1 each (1 capsule total) by mouth daily. 90 capsule 1   No facility-administered medications prior to visit.     ROS Review of Systems  Constitutional: Negative.  Negative for appetite change,  diaphoresis, fatigue and unexpected weight change.  HENT: Positive for voice change. Negative for facial swelling and trouble swallowing.   Eyes: Negative.   Respiratory: Negative.  Negative for cough, chest tightness, shortness of breath and wheezing.   Cardiovascular: Negative for chest pain, palpitations and leg swelling.  Gastrointestinal: Negative for abdominal pain, constipation, diarrhea, nausea and vomiting.  Endocrine: Negative.   Genitourinary: Negative.   Musculoskeletal: Negative for arthralgias and myalgias.  Skin: Negative.  Negative for color change and pallor.  Neurological: Positive for dizziness and headaches. Negative for weakness, light-headedness and numbness.  Hematological: Negative for adenopathy. Does not bruise/bleed easily.  Psychiatric/Behavioral: Negative.     Objective:  BP (!) 130/98 (BP Location: Left Arm, Patient Position: Sitting, Cuff Size: Normal)   Pulse 90   Temp 98 F (36.7 C) (Oral)   Resp 16   Ht 5\' 2"  (1.575 m)   Wt 143 lb 8 oz (65.1 kg)   SpO2 96%   BMI 26.25 kg/m   BP Readings from Last 3 Encounters:  07/25/19 (!) 130/98  04/27/19 120/80  09/21/18 (!) 146/82    Wt Readings from Last 3 Encounters:  07/25/19 143 lb 8 oz (65.1 kg)  04/27/19 147 lb (66.7 kg)  09/21/18 147 lb 8 oz (66.9 kg)    Physical Exam Vitals signs reviewed.  Constitutional:      General: She is not in acute distress.    Appearance: Normal appearance. She is ill-appearing. She is not  toxic-appearing or diaphoretic.     Comments: + raspy voice  HENT:     Nose: Nose normal.     Mouth/Throat:     Mouth: Mucous membranes are moist.  Eyes:     General: No scleral icterus.    Conjunctiva/sclera: Conjunctivae normal.  Neck:     Musculoskeletal: Neck supple.  Cardiovascular:     Rate and Rhythm: Regular rhythm. Tachycardia present.     Heart sounds: S1 normal and S2 normal. No murmur. No gallop.      Comments: EKG ----  Sinus  Tachycardia  WITHIN NORMAL  LIMITS  Pulmonary:     Effort: Pulmonary effort is normal. No respiratory distress.     Breath sounds: Normal breath sounds. No stridor. No wheezing, rhonchi or rales.  Abdominal:     General: Abdomen is flat. Bowel sounds are normal. There is no distension.     Palpations: Abdomen is soft. There is no hepatomegaly or splenomegaly.     Tenderness: There is no abdominal tenderness.  Musculoskeletal:     Right lower leg: No edema.     Left lower leg: No edema.  Lymphadenopathy:     Cervical: No cervical adenopathy.  Skin:    General: Skin is warm and dry.     Coloration: Skin is not pale.     Findings: No erythema.  Neurological:     General: No focal deficit present.     Mental Status: She is alert.  Psychiatric:        Mood and Affect: Mood normal.        Behavior: Behavior normal.     Lab Results  Component Value Date   WBC 7.7 07/25/2019   HGB 16.6 (H) 07/25/2019   HCT 48.9 (H) 07/25/2019   PLT 284.0 07/25/2019   GLUCOSE 111 (H) 07/25/2019   CHOL 290 (H) 07/25/2019   TRIG 149.0 07/25/2019   HDL 55.20 07/25/2019   LDLCALC 205 (H) 07/25/2019   ALT 18 07/25/2019   AST 13 07/25/2019   NA 138 07/25/2019   K 4.3 07/25/2019   CL 99 07/25/2019   CREATININE 0.86 07/25/2019   BUN 17 07/25/2019   CO2 30 07/25/2019   TSH 1.09 07/25/2019   PSA WNL 10/30/2015   INR 1.1 (H) 12/31/2015   HGBA1C 6.3 07/25/2019    Mm 3d Screen Breast Bilateral  Result Date: 03/07/2019 CLINICAL DATA:  Screening. EXAM: DIGITAL SCREENING BILATERAL MAMMOGRAM WITH TOMO AND CAD COMPARISON:  Previous exam(s). ACR Breast Density Category b: There are scattered areas of fibroglandular density. FINDINGS: There are no findings suspicious for malignancy. Images were processed with CAD. IMPRESSION: No mammographic evidence of malignancy. A result letter of this screening mammogram will be mailed directly to the patient. RECOMMENDATION: Screening mammogram in one year. (Code:SM-B-01Y) BI-RADS CATEGORY  1:  Negative. Electronically Signed   By: Marin Olp M.D.   On: 03/07/2019 17:20    Assessment & Plan:   Rebeccalynn was seen today for annual exam, hypertension and hyperlipidemia.  Diagnoses and all orders for this visit:  Essential hypertension- Her blood pressure is not adequately well controlled and she is symptomatic.  I have asked her to treat this with indapamide. -     EKG 12-Lead -     indapamide (LOZOL) 1.25 MG tablet; Take 1 tablet (1.25 mg total) by mouth daily. -     CBC with Differential; Future -     Basic metabolic panel; Future -     TSH;  Future -     Urinalysis, Routine w reflex microscopic; Future  Prediabetes- Her A1c is at 6.3%.  Medical therapy is not indicated. -     Hemoglobin A1c; Future  Hyperlipidemia with target LDL less than 130- Her LDL is elevated at 205.  I have asked her to take a statin for cardiovascular risk reduction. -     Lipid panel; Future -     Hepatic function panel; Future -     rosuvastatin (CRESTOR) 20 MG tablet; Take 1 tablet (20 mg total) by mouth daily.  Routine general medical examination at a health care facility-exam completed, labs reviewed, she refused a flu vaccine, breast cancer screening/cervical cancer screening/colon cancer screening are all up-to-date, patient education was given.  Laryngitis, chronic -     Ambulatory referral to ENT  Erythrocytosis -     Ambulatory referral to Hematology   I have discontinued Kasidi G. Strohmeier's potassium chloride, triamterene-hydrochlorothiazide, and indomethacin. I am also having her start on indapamide and rosuvastatin. Additionally, I am having her maintain her fluticasone, fluticasone furoate-vilanterol, albuterol, montelukast, gabapentin, loratadine, traMADol, and cyclobenzaprine.  Meds ordered this encounter  Medications  . indapamide (LOZOL) 1.25 MG tablet    Sig: Take 1 tablet (1.25 mg total) by mouth daily.    Dispense:  90 tablet    Refill:  0  . rosuvastatin (CRESTOR) 20 MG  tablet    Sig: Take 1 tablet (20 mg total) by mouth daily.    Dispense:  90 tablet    Refill:  1     Follow-up: Return in about 3 months (around 10/25/2019).  Scarlette Calico, MD

## 2019-07-26 DIAGNOSIS — D751 Secondary polycythemia: Secondary | ICD-10-CM | POA: Insufficient documentation

## 2019-07-28 ENCOUNTER — Telehealth: Payer: Self-pay | Admitting: Hematology

## 2019-07-28 NOTE — Telephone Encounter (Signed)
Ms. Pruitt returned my call and has been scheduled to see Dr. Irene Limbo on 12/7 at 1pm. Pt has been made aware to arrive 15 minutes early.

## 2019-08-09 ENCOUNTER — Encounter: Payer: Self-pay | Admitting: Internal Medicine

## 2019-08-10 ENCOUNTER — Other Ambulatory Visit: Payer: Self-pay

## 2019-08-10 ENCOUNTER — Ambulatory Visit (INDEPENDENT_AMBULATORY_CARE_PROVIDER_SITE_OTHER): Payer: BC Managed Care – PPO | Admitting: Otolaryngology

## 2019-08-10 ENCOUNTER — Encounter (INDEPENDENT_AMBULATORY_CARE_PROVIDER_SITE_OTHER): Payer: Self-pay | Admitting: Otolaryngology

## 2019-08-10 VITALS — Temp 97.7°F

## 2019-08-10 DIAGNOSIS — J310 Chronic rhinitis: Secondary | ICD-10-CM | POA: Diagnosis not present

## 2019-08-10 DIAGNOSIS — R490 Dysphonia: Secondary | ICD-10-CM | POA: Diagnosis not present

## 2019-08-10 MED ORDER — OMEPRAZOLE 40 MG PO CPDR: 40.0000 mg | DELAYED_RELEASE_CAPSULE | Freq: Every day | ORAL | 1 refills | Status: DC

## 2019-08-10 MED ORDER — TRIAMCINOLONE ACETONIDE 55 MCG/ACT NA AERO: 2.0000 | INHALATION_SPRAY | Freq: Every day | NASAL | 12 refills | Status: DC

## 2019-08-10 NOTE — Progress Notes (Signed)
HPI: Julie Rivas is a 63 y.o. female who presents is referred by Dr. Ronnald Ramp for evaluation of hoarseness.  She states that she has hoarseness that tends to come and go for the past year.  Sometimes is worse than other times.  In the office today she has minimal raspiness.  She has occasional sore throat but has no difficulty swallowing. She has intermittent nasal congestion with only clear postnasal drainage. She does not smoke.  Past Medical History:  Diagnosis Date  . Arthritis   . Asthma    Past Surgical History:  Procedure Laterality Date  . ECTOPIC PREGNANCY SURGERY     has partial right fallopian tube   Social History   Socioeconomic History  . Marital status: Married    Spouse name: Not on file  . Number of children: Not on file  . Years of education: Not on file  . Highest education level: Not on file  Occupational History  . Not on file  Social Needs  . Financial resource strain: Not on file  . Food insecurity    Worry: Not on file    Inability: Not on file  . Transportation needs    Medical: Not on file    Non-medical: Not on file  Tobacco Use  . Smoking status: Never Smoker  . Smokeless tobacco: Never Used  Substance and Sexual Activity  . Alcohol use: No  . Drug use: No  . Sexual activity: Yes    Birth control/protection: Surgical  Lifestyle  . Physical activity    Days per week: Not on file    Minutes per session: Not on file  . Stress: Not on file  Relationships  . Social Herbalist on phone: Not on file    Gets together: Not on file    Attends religious service: Not on file    Active member of club or organization: Not on file    Attends meetings of clubs or organizations: Not on file    Relationship status: Not on file  Other Topics Concern  . Not on file  Social History Narrative  . Not on file   Family History  Problem Relation Age of Onset  . Heart failure Mother   . Arthritis Mother   . Cancer Father   . Hypertension  Father   . Diabetes Sister   . Colon cancer Neg Hx   . Esophageal cancer Neg Hx   . Stomach cancer Neg Hx   . Rectal cancer Neg Hx   . Hyperlipidemia Neg Hx   . Heart disease Neg Hx   . Early death Neg Hx   . Depression Neg Hx    Allergies  Allergen Reactions  . Codeine Shortness Of Breath and Swelling   Prior to Admission medications   Medication Sig Start Date End Date Taking? Authorizing Provider  albuterol (PROVENTIL) (2.5 MG/3ML) 0.083% nebulizer solution Take 3 mLs (2.5 mg total) by nebulization every 6 (six) hours as needed for wheezing or shortness of breath. 09/15/17  Yes Janith Lima, MD  cyclobenzaprine (FLEXERIL) 10 MG tablet TAKE 1 TABLET UP TO THREE TIMES DAILY. 12/27/18  Yes Janith Lima, MD  fluticasone (FLONASE) 50 MCG/ACT nasal spray Place 2 sprays into both nostrils daily. 10/30/15  Yes Janith Lima, MD  fluticasone furoate-vilanterol (BREO ELLIPTA) 200-25 MCG/INH AEPB Inhale 1 puff into the lungs daily. 09/14/17  Yes Janith Lima, MD  gabapentin (NEURONTIN) 300 MG capsule TAKE 1 CAPSULE AT  BEDTIME. 08/17/18  Yes Janith Lima, MD  indapamide (LOZOL) 1.25 MG tablet Take 1 tablet (1.25 mg total) by mouth daily. 07/25/19  Yes Janith Lima, MD  loratadine (CLARITIN) 10 MG tablet Take 1 tablet (10 mg total) by mouth daily. 09/13/18  Yes Janith Lima, MD  montelukast (SINGULAIR) 10 MG tablet TAKE ONE TABLET AT BEDTIME. 11/25/17  Yes Janith Lima, MD  rosuvastatin (CRESTOR) 20 MG tablet Take 1 tablet (20 mg total) by mouth daily. 07/25/19  Yes Janith Lima, MD  traMADol (ULTRAM) 50 MG tablet Take 1 tablet (50 mg total) by mouth every 8 (eight) hours as needed. 09/21/18  Yes Janith Lima, MD     Positive ROS: Otherwise negative.  All other systems have been reviewed and were otherwise negative with the exception of those mentioned in the HPI and as above.  Physical Exam: Constitutional: Alert, well-appearing, no acute distress Ears: External ears  without lesions or tenderness. Ear canals are clear bilaterally with intact, clear TMs.  Nasal: External nose without lesions. Septum mildly deviated to the left.  Mild rhinitis with clear mucus discharge.. Clear nasal passages.  Middle meatus regions are clear with no signs of infection. Oral: Lips and gums without lesions. Tongue and palate mucosa without lesions. Posterior oropharynx clear. Fiberoptic laryngoscopy was performed through the right nostril.  Nasopharynx was clear.  Base of tongue, vallecula and epiglottis were normal.  Vocal cords were clear bilaterally with normal vocal cord mobility.  No vocal cord lesions noted.  She had a small amount of mucus on the vocal cords which was clear.  Piriform sinuses were clear.  Minimal arytenoid edema may be indicative of reflux. Neck: No palpable adenopathy or masses.  She complains of mild tenderness on palpation of the neck but there are no palpable masses. Respiratory: Breathing comfortably  Skin: No facial/neck lesions or rash noted.  Laryngoscopy  Date/Time: 08/10/2019 2:09 PM Performed by: Rozetta Nunnery, MD Authorized by: Rozetta Nunnery, MD   Consent:    Consent obtained:  Verbal   Consent given by:  Patient   Risks discussed:  Pain Procedure details:    Indications: hoarseness, dysphagia, or aspiration     Instrument: flexible fiberoptic laryngoscope     Scope location: right nare   Mouth:    Posterior pharyngeal wall: normal     Oropharynx: normal     Vallecula: normal     Base of tongue: normal     Epiglottis: normal   Throat:    Pyriform sinus: normal     False vocal cords: normal     True vocal cords: normal   Comments:     Minimal arytenoid edema may be indicative of reflux.  No vocal cord lesions noted    Assessment: Intermittent hoarseness with normal laryngeal examination.  May be indicative of laryngeal pharyngeal reflux disease. Mild rhinitis with clear mucus discharge.  Plan: Suggested  trying Nasacort 2 sprays at night as this will help some with nasal congestion as well as some postnasal drainage. Suggested trying omeprazole 40 mg daily before dinner for 1 month to see if this helps at all with the intermittent hoarseness.  Reassured her of normal vocal cord examination otherwise.   Radene Journey, MD   CC:

## 2019-08-11 ENCOUNTER — Ambulatory Visit: Payer: BC Managed Care – PPO | Admitting: Podiatry

## 2019-08-14 ENCOUNTER — Other Ambulatory Visit: Payer: Self-pay

## 2019-08-14 ENCOUNTER — Inpatient Hospital Stay: Payer: Medicare Other

## 2019-08-14 ENCOUNTER — Encounter: Payer: Self-pay | Admitting: Hematology

## 2019-08-14 ENCOUNTER — Inpatient Hospital Stay: Payer: Medicare Other | Attending: Hematology | Admitting: Hematology

## 2019-08-14 VITALS — BP 139/87 | HR 99 | Temp 98.5°F | Resp 18 | Ht 62.0 in | Wt 149.6 lb

## 2019-08-14 DIAGNOSIS — M1990 Unspecified osteoarthritis, unspecified site: Secondary | ICD-10-CM | POA: Diagnosis not present

## 2019-08-14 DIAGNOSIS — I10 Essential (primary) hypertension: Secondary | ICD-10-CM | POA: Insufficient documentation

## 2019-08-14 DIAGNOSIS — Z79899 Other long term (current) drug therapy: Secondary | ICD-10-CM | POA: Diagnosis not present

## 2019-08-14 DIAGNOSIS — D751 Secondary polycythemia: Secondary | ICD-10-CM | POA: Diagnosis not present

## 2019-08-14 DIAGNOSIS — M722 Plantar fascial fibromatosis: Secondary | ICD-10-CM | POA: Diagnosis not present

## 2019-08-14 DIAGNOSIS — R5383 Other fatigue: Secondary | ICD-10-CM | POA: Diagnosis not present

## 2019-08-14 DIAGNOSIS — J45909 Unspecified asthma, uncomplicated: Secondary | ICD-10-CM | POA: Insufficient documentation

## 2019-08-14 LAB — CBC WITH DIFFERENTIAL/PLATELET
Abs Immature Granulocytes: 0.01 10*3/uL (ref 0.00–0.07)
Basophils Absolute: 0 10*3/uL (ref 0.0–0.1)
Basophils Relative: 1 %
Eosinophils Absolute: 0.1 10*3/uL (ref 0.0–0.5)
Eosinophils Relative: 3 %
HCT: 40 % (ref 36.0–46.0)
Hemoglobin: 13.5 g/dL (ref 12.0–15.0)
Immature Granulocytes: 0 %
Lymphocytes Relative: 41 %
Lymphs Abs: 2.3 10*3/uL (ref 0.7–4.0)
MCH: 31.7 pg (ref 26.0–34.0)
MCHC: 33.8 g/dL (ref 30.0–36.0)
MCV: 93.9 fL (ref 80.0–100.0)
Monocytes Absolute: 0.4 10*3/uL (ref 0.1–1.0)
Monocytes Relative: 7 %
Neutro Abs: 2.7 10*3/uL (ref 1.7–7.7)
Neutrophils Relative %: 48 %
Platelets: 237 10*3/uL (ref 150–400)
RBC: 4.26 MIL/uL (ref 3.87–5.11)
RDW: 13.1 % (ref 11.5–15.5)
WBC: 5.6 10*3/uL (ref 4.0–10.5)
nRBC: 0 % (ref 0.0–0.2)

## 2019-08-14 LAB — CMP (CANCER CENTER ONLY)
ALT: 35 U/L (ref 0–44)
AST: 18 U/L (ref 15–41)
Albumin: 3.4 g/dL — ABNORMAL LOW (ref 3.5–5.0)
Alkaline Phosphatase: 142 U/L — ABNORMAL HIGH (ref 38–126)
Anion gap: 8 (ref 5–15)
BUN: 8 mg/dL (ref 8–23)
CO2: 31 mmol/L (ref 22–32)
Calcium: 9 mg/dL (ref 8.9–10.3)
Chloride: 105 mmol/L (ref 98–111)
Creatinine: 0.75 mg/dL (ref 0.44–1.00)
GFR, Est AFR Am: >60 mL/min (ref >60)
GFR, Estimated: >60 mL/min (ref >60)
Glucose, Bld: 97 mg/dL (ref 70–99)
Potassium: 3.6 mmol/L (ref 3.5–5.1)
Sodium: 144 mmol/L (ref 135–145)
Total Bilirubin: 0.3 mg/dL (ref 0.3–1.2)
Total Protein: 7 g/dL (ref 6.5–8.1)

## 2019-08-14 NOTE — Progress Notes (Signed)
HEMATOLOGY/ONCOLOGY CONSULTATION NOTE  Date of Service: 08/14/2019  Patient Care Team: Janith Lima, MD as PCP - General (Internal Medicine)  CHIEF COMPLAINTS/PURPOSE OF CONSULTATION:  Polycythemia  HISTORY OF PRESENTING ILLNESS:   Julie Rivas is a wonderful 63 y.o. female who has been referred to [place] by Dr Ronnald Ramp for evaluation and management of polycythemia. The pt reports that she is doing well overall.  The pt reports that she has noticed some increased fatigue since the summertime. Pt retired in July but she remains active by [employer]. Pt was given her last labs as apart of her yearly physical and denies having any new symptoms during that time. Her walking is limited due to bone spurs and plantar faciitis, not SOB. She is still able to getting around her house, although she has to walk on the balls of her feet. Pt was given steroids to treat her Plantar fasciitis by her Podiatrist, Dr. Collier Salina, and it caused her blood pressure to rise. She has since been placed on HTN medication. Pt notes that her vision has gotten worse lately but that she also has Cataracts. Pt was placed on Lozol around the same time as her last labs and as been taking it consistently.  She has recently been placed on an acid suppressant and allergy medication. She often has allergy symptoms in her eyes. Pt is also an asthmatic. Pt drinks water only 2-3 times per week, as she drinks mostly tea. She has never been a smoker. She has trouble sleeping through the night and very rarely takes naps during the day. Pt has a new grandchild, and has not been able to see him due to the pandemic and concerns for other infections.   Most recent lab results (07/25/2019) of CBC w/diff and CMP is as follows: all values are WNL except for RBC at 5.21, Hgb at 16.6, HCT at 48.9, Alkaline Phosphatase at 129, Glucose at 111.  On review of systems, pt reports fatigue, sleeplessness and denies SOB, fevers, chills, night  sweats, unexpected weight loss, abdominal pain and any other symptoms.   On PMHx the pt reports Allergies, Asthma, HTN, Bone Spurs, Plantar Faciitis, Cataracts. On Social Hx the pt reports that she has never been a smoker.  MEDICAL HISTORY:  Past Medical History:  Diagnosis Date  . Arthritis   . Asthma     SURGICAL HISTORY: Past Surgical History:  Procedure Laterality Date  . ECTOPIC PREGNANCY SURGERY     has partial right fallopian tube    SOCIAL HISTORY: Social History   Socioeconomic History  . Marital status: Married    Spouse name: Not on file  . Number of children: Not on file  . Years of education: Not on file  . Highest education level: Not on file  Occupational History  . Not on file  Social Needs  . Financial resource strain: Not on file  . Food insecurity    Worry: Not on file    Inability: Not on file  . Transportation needs    Medical: Not on file    Non-medical: Not on file  Tobacco Use  . Smoking status: Never Smoker  . Smokeless tobacco: Never Used  Substance and Sexual Activity  . Alcohol use: No  . Drug use: No  . Sexual activity: Yes    Birth control/protection: Surgical  Lifestyle  . Physical activity    Days per week: Not on file    Minutes per session: Not on file  .  Stress: Not on file  Relationships  . Social Herbalist on phone: Not on file    Gets together: Not on file    Attends religious service: Not on file    Active member of club or organization: Not on file    Attends meetings of clubs or organizations: Not on file    Relationship status: Not on file  . Intimate partner violence    Fear of current or ex partner: Not on file    Emotionally abused: Not on file    Physically abused: Not on file    Forced sexual activity: Not on file  Other Topics Concern  . Not on file  Social History Narrative  . Not on file    FAMILY HISTORY: Family History  Problem Relation Age of Onset  . Heart failure Mother   .  Arthritis Mother   . Cancer Father   . Hypertension Father   . Diabetes Sister   . Colon cancer Neg Hx   . Esophageal cancer Neg Hx   . Stomach cancer Neg Hx   . Rectal cancer Neg Hx   . Hyperlipidemia Neg Hx   . Heart disease Neg Hx   . Early death Neg Hx   . Depression Neg Hx     ALLERGIES:  is allergic to codeine.  MEDICATIONS:  Current Outpatient Medications  Medication Sig Dispense Refill  . albuterol (PROVENTIL) (2.5 MG/3ML) 0.083% nebulizer solution Take 3 mLs (2.5 mg total) by nebulization every 6 (six) hours as needed for wheezing or shortness of breath. 150 mL 2  . cyclobenzaprine (FLEXERIL) 10 MG tablet TAKE 1 TABLET UP TO THREE TIMES DAILY. 90 tablet 2  . fluticasone (FLONASE) 50 MCG/ACT nasal spray Place 2 sprays into both nostrils daily. 16 g 11  . fluticasone furoate-vilanterol (BREO ELLIPTA) 200-25 MCG/INH AEPB Inhale 1 puff into the lungs daily. 90 each 3  . gabapentin (NEURONTIN) 300 MG capsule TAKE 1 CAPSULE AT BEDTIME. 90 capsule 3  . indapamide (LOZOL) 1.25 MG tablet Take 1 tablet (1.25 mg total) by mouth daily. 90 tablet 0  . loratadine (CLARITIN) 10 MG tablet Take 1 tablet (10 mg total) by mouth daily. 30 tablet 11  . montelukast (SINGULAIR) 10 MG tablet TAKE ONE TABLET AT BEDTIME. 90 tablet 1  . omeprazole (PRILOSEC) 40 MG capsule Take 1 capsule (40 mg total) by mouth daily. 30 capsule 1  . rosuvastatin (CRESTOR) 20 MG tablet Take 1 tablet (20 mg total) by mouth daily. 90 tablet 1  . traMADol (ULTRAM) 50 MG tablet Take 1 tablet (50 mg total) by mouth every 8 (eight) hours as needed. 90 tablet 3  . triamcinolone (NASACORT) 55 MCG/ACT AERO nasal inhaler Place 2 sprays into the nose daily. 1 Inhaler 12   No current facility-administered medications for this visit.     REVIEW OF SYSTEMS:    10 Point review of Systems was done is negative except as noted above.  PHYSICAL EXAMINATION: ECOG PERFORMANCE STATUS: 2 - Symptomatic, <50% confined to bed  .  Vitals:   08/14/19 1323  BP: 139/87  Pulse: 99  Resp: 18  Temp: 98.5 F (36.9 C)  SpO2: 100%   Filed Weights   08/14/19 1323  Weight: 149 lb 9.6 oz (67.9 kg)   .Body mass index is 27.36 kg/m.  GENERAL:alert, in no acute distress and comfortable SKIN: no acute rashes, no significant lesions EYES: conjunctiva are pink and non-injected, sclera anicteric OROPHARYNX: MMM, no exudates,  no oropharyngeal erythema or ulceration NECK: supple, no JVD LYMPH:  no palpable lymphadenopathy in the cervical, axillary or inguinal regions LUNGS: clear to auscultation b/l with normal respiratory effort HEART: regular rate & rhythm ABDOMEN:  normoactive bowel sounds , non tender, not distended. Extremity: no pedal edema PSYCH: alert & oriented x 3 with fluent speech NEURO: no focal motor/sensory deficits  LABORATORY DATA:  I have reviewed the data as listed  . CBC Latest Ref Rng & Units 08/14/2019 07/25/2019 07/04/2018  WBC 4.0 - 10.5 K/uL 5.6 7.7 6.7  Hemoglobin 12.0 - 15.0 g/dL 13.5 16.6(H) 14.6  Hematocrit 36.0 - 46.0 % 40.0 48.9(H) 42.9  Platelets 150 - 400 K/uL 237 284.0 253.0    . CMP Latest Ref Rng & Units 08/14/2019 07/25/2019 07/04/2018  Glucose 70 - 99 mg/dL 97 111(H) 108(H)  BUN 8 - 23 mg/dL 8 17 11   Creatinine 0.44 - 1.00 mg/dL 0.75 0.86 0.67  Sodium 135 - 145 mmol/L 144 138 140  Potassium 3.5 - 5.1 mmol/L 3.6 4.3 4.0  Chloride 98 - 111 mmol/L 105 99 103  CO2 22 - 32 mmol/L 31 30 28   Calcium 8.9 - 10.3 mg/dL 9.0 10.0 9.6  Total Protein 6.5 - 8.1 g/dL 7.0 8.0 7.2  Total Bilirubin 0.3 - 1.2 mg/dL 0.3 0.6 0.2  Alkaline Phos 38 - 126 U/L 142(H) 129(H) 114  AST 15 - 41 U/L 18 13 14   ALT 0 - 44 U/L 35 18 18     RADIOGRAPHIC STUDIES: I have personally reviewed the radiological images as listed and agreed with the findings in the report. Dg Foot Complete Right  Result Date: 07/20/2019 Please see detailed radiograph report in office note.   ASSESSMENT & PLAN:   63 yo  with   1) Polycythemia- borderline Likely secondary to dehydration from limited po fluid intake and diuretics. PLAN: -Discussed patient's most recent labs from 07/25/2019, all values are WNL except for RBC at 5.21, Hgb at 16.6, HCT at 48.9, Alkaline Phosphatase at 129, Glucose at 111. -Discussed pt's Polycythemia being due to a primary bone marrow problem vs. a reactive process  -Pt does not have an enlarged spleen -Based on previous labs and clinical examination pt does not appear to have a primary bone marrow problem -Pt's polycythemia is likely due to dehydration, pt does not drink the recommended amount of water daily, drinks a high volume of tea, and is taking a diuretic -Recommended that the pt eat well and drink at least 48-64 oz of water each day.  -Will get JAK2 mutation study to r/o Polycythemia Vera -Will get labs today  -Will see back in 2 weeks via phone   FOLLOW UP: Labs today Phone visit with Dr Irene Limbo in 12-14 days  All of the patients questions were answered with apparent satisfaction. The patient knows to call the clinic with any problems, questions or concerns.  I spent 30 mins counseling the patient face to face. The total time spent in the appointment was 35 minutes and more than 50% was on counseling and direct patient cares.    Sullivan Lone MD [place] AAHIVMS [facility] Hematology/Oncology Physician [facility]  (Office):       [phone] (Work cell):  [phone] (Fax):           [phone]  08/14/2019 4:29 PM   I, Yevette Edwards, am acting as a scribe for Dr. Sullivan Lone.   .I have reviewed the above documentation for accuracy and completeness, and  I agree with the above. Brunetta Genera MD   ADDENDUM  Labs today  . CBC    Component Value Date/Time   WBC 5.6 08/14/2019 1400   RBC 4.26 08/14/2019 1400   HGB 13.5 08/14/2019 1400   HCT 40.0 08/14/2019 1400   PLT 237 08/14/2019 1400   MCV 93.9 08/14/2019 1400   MCH 31.7  08/14/2019 1400   MCHC 33.8 08/14/2019 1400   RDW 13.1 08/14/2019 1400   LYMPHSABS 2.3 08/14/2019 1400   MONOABS 0.4 08/14/2019 1400   EOSABS 0.1 08/14/2019 1400   BASOSABS 0.0 08/14/2019 1400   _ polycythemia has resolved with better hydration. -no evidence of PCV.

## 2019-08-15 ENCOUNTER — Telehealth: Payer: Self-pay | Admitting: Hematology

## 2019-08-15 NOTE — Telephone Encounter (Signed)
Scheduled appt per 12/7 los.  Spoke with pt and she is aware of the appt date and time,

## 2019-08-30 ENCOUNTER — Inpatient Hospital Stay (HOSPITAL_BASED_OUTPATIENT_CLINIC_OR_DEPARTMENT_OTHER): Payer: Medicare Other | Admitting: Hematology

## 2019-08-30 DIAGNOSIS — M722 Plantar fascial fibromatosis: Secondary | ICD-10-CM | POA: Diagnosis not present

## 2019-08-30 DIAGNOSIS — J45909 Unspecified asthma, uncomplicated: Secondary | ICD-10-CM | POA: Diagnosis not present

## 2019-08-30 DIAGNOSIS — R5383 Other fatigue: Secondary | ICD-10-CM | POA: Diagnosis not present

## 2019-08-30 DIAGNOSIS — Z79899 Other long term (current) drug therapy: Secondary | ICD-10-CM | POA: Diagnosis not present

## 2019-08-30 DIAGNOSIS — I10 Essential (primary) hypertension: Secondary | ICD-10-CM | POA: Diagnosis not present

## 2019-08-30 DIAGNOSIS — M1990 Unspecified osteoarthritis, unspecified site: Secondary | ICD-10-CM | POA: Diagnosis not present

## 2019-08-30 DIAGNOSIS — D751 Secondary polycythemia: Secondary | ICD-10-CM | POA: Diagnosis not present

## 2019-08-30 DIAGNOSIS — H40013 Open angle with borderline findings, low risk, bilateral: Secondary | ICD-10-CM | POA: Diagnosis not present

## 2019-08-30 NOTE — Progress Notes (Signed)
HEMATOLOGY/ONCOLOGY CLINIC NOTE  Date of Service: 08/30/2019  Patient Care Team: Janith Lima, MD as PCP - General (Internal Medicine)  CHIEF COMPLAINTS/PURPOSE OF CONSULTATION:  Polycythemia  HISTORY OF PRESENTING ILLNESS:   Julie Rivas is a wonderful 63 y.o. female who has been referred to [place] by Dr Ronnald Ramp for evaluation and management of polycythemia. The pt reports that she is doing well overall.  The pt reports that she has noticed some increased fatigue since the summertime. Pt retired in July but she remains active by [employer]. Pt was given her last labs as apart of her yearly physical and denies having any new symptoms during that time. Her walking is limited due to bone spurs and plantar faciitis, not SOB. She is still able to getting around her house, although she has to walk on the balls of her feet. Pt was given steroids to treat her Plantar fasciitis by her Podiatrist, Dr. Collier Salina, and it caused her blood pressure to rise. She has since been placed on HTN medication. Pt notes that her vision has gotten worse lately but that she also has Cataracts. Pt was placed on Lozol around the same time as her last labs and as been taking it consistently.  She has recently been placed on an acid suppressant and allergy medication. She often has allergy symptoms in her eyes. Pt is also an asthmatic. Pt drinks water only 2-3 times per week, as she drinks mostly tea. She has never been a smoker. She has trouble sleeping through the night and very rarely takes naps during the day. Pt has a new grandchild, and has not been able to see him due to the pandemic and concerns for other infections.   Most recent lab results (07/25/2019) of CBC w/diff and CMP is as follows: all values are WNL except for RBC at 5.21, Hgb at 16.6, HCT at 48.9, Alkaline Phosphatase at 129, Glucose at 111.  On review of systems, pt reports fatigue, sleeplessness and denies SOB, fevers, chills, night  sweats, unexpected weight loss, abdominal pain and any other symptoms.   On PMHx the pt reports Allergies, Asthma, HTN, Bone Spurs, Plantar Faciitis, Cataracts. On Social Hx the pt reports that she has never been a smoker.  INTERVAL HISTORY:  I connected with Julie Rivas on 08/30/19 at  3:00 PM EST by TELEHEALTH and verified that I am speaking with the correct person using two identifiers.   I discussed the limitations, risks, security and privacy concerns of performing an evaluation and management service by telemedicine and the availability of in-person appointments. I also discussed with the patient that there may be a patient responsible charge related to this service. The patient expressed understanding and agreed to proceed.   Other persons participating in the visit and their role in the encounter: Aara McKoy- medical scribe   Patient's location: Home  Provider's location: [place]   Chief Complaint: Polycythemia  Julie Rivas is a 63 y.o. female here for evaluation and management of Polycythemia. The patient's last visit with [place] was on 08/14/2019. The pt reports that she is doing well overall.  The pt reports that she is feeling okay.  Lab results today (04/26/19) of CBC w/diff and CMP is as follows: all values are WNL except for Albumin at 3.4, Alkaline Phosphatase at 142  PENDING JAK2.  On review of systems, pt reports no new concerns and denies and any other symptoms.   MEDICAL HISTORY:  Past Medical History:  Diagnosis Date  . Arthritis   . Asthma     SURGICAL HISTORY: Past Surgical History:  Procedure Laterality Date  . ECTOPIC PREGNANCY SURGERY     has partial right fallopian tube    SOCIAL HISTORY: Social History   Socioeconomic History  . Marital status: Married    Spouse name: Not on file  . Number of children: Not on file  . Years of education: Not on file  . Highest education level: Not on file  Occupational History  . Not on file  Tobacco  Use  . Smoking status: Never Smoker  . Smokeless tobacco: Never Used  Substance and Sexual Activity  . Alcohol use: No  . Drug use: No  . Sexual activity: Yes    Birth control/protection: Surgical  Other Topics Concern  . Not on file  Social History Narrative  . Not on file   Social Determinants of Health   Financial Resource Strain:   . Difficulty of Paying Living Expenses: Not on file  Food Insecurity:   . Worried About Charity fundraiser in the Last Year: Not on file  . Ran Out of Food in the Last Year: Not on file  Transportation Needs:   . Lack of Transportation (Medical): Not on file  . Lack of Transportation (Non-Medical): Not on file  Physical Activity:   . Days of Exercise per Week: Not on file  . Minutes of Exercise per Session: Not on file  Stress:   . Feeling of Stress : Not on file  Social Connections:   . Frequency of Communication with Friends and Family: Not on file  . Frequency of Social Gatherings with Friends and Family: Not on file  . Attends Religious Services: Not on file  . Active Member of Clubs or Organizations: Not on file  . Attends Archivist Meetings: Not on file  . Marital Status: Not on file  Intimate Partner Violence:   . Fear of Current or Ex-Partner: Not on file  . Emotionally Abused: Not on file  . Physically Abused: Not on file  . Sexually Abused: Not on file    FAMILY HISTORY: Family History  Problem Relation Age of Onset  . Heart failure Mother   . Arthritis Mother   . Cancer Father   . Hypertension Father   . Diabetes Sister   . Colon cancer Neg Hx   . Esophageal cancer Neg Hx   . Stomach cancer Neg Hx   . Rectal cancer Neg Hx   . Hyperlipidemia Neg Hx   . Heart disease Neg Hx   . Early death Neg Hx   . Depression Neg Hx    ALLERGIES:  is allergic to codeine.  MEDICATIONS:  Current Outpatient Medications  Medication Sig Dispense Refill  . albuterol (PROVENTIL) (2.5 MG/3ML) 0.083% nebulizer solution Take  3 mLs (2.5 mg total) by nebulization every 6 (six) hours as needed for wheezing or shortness of breath. 150 mL 2  . cyclobenzaprine (FLEXERIL) 10 MG tablet TAKE 1 TABLET UP TO THREE TIMES DAILY. 90 tablet 2  . fluticasone (FLONASE) 50 MCG/ACT nasal spray Place 2 sprays into both nostrils daily. 16 g 11  . fluticasone furoate-vilanterol (BREO ELLIPTA) 200-25 MCG/INH AEPB Inhale 1 puff into the lungs daily. 90 each 3  . gabapentin (NEURONTIN) 300 MG capsule TAKE 1 CAPSULE AT BEDTIME. 90 capsule 3  . indapamide (LOZOL) 1.25 MG tablet Take 1 tablet (1.25 mg total) by mouth daily. 90 tablet 0  .  loratadine (CLARITIN) 10 MG tablet Take 1 tablet (10 mg total) by mouth daily. 30 tablet 11  . montelukast (SINGULAIR) 10 MG tablet TAKE ONE TABLET AT BEDTIME. 90 tablet 1  . omeprazole (PRILOSEC) 40 MG capsule Take 1 capsule (40 mg total) by mouth daily. 30 capsule 1  . rosuvastatin (CRESTOR) 20 MG tablet Take 1 tablet (20 mg total) by mouth daily. 90 tablet 1  . traMADol (ULTRAM) 50 MG tablet Take 1 tablet (50 mg total) by mouth every 8 (eight) hours as needed. 90 tablet 3  . triamcinolone (NASACORT) 55 MCG/ACT AERO nasal inhaler Place 2 sprays into the nose daily. 1 Inhaler 12   No current facility-administered medications for this visit.    REVIEW OF SYSTEMS:   A 10+ POINT REVIEW OF SYSTEMS WAS OBTAINED including neurology, dermatology, psychiatry, cardiac, respiratory, lymph, extremities, GI, GU, Musculoskeletal, constitutional, breasts, reproductive, HEENT.  All pertinent positives are noted in the HPI.  All others are negative.    PHYSICAL EXAMINATION: There were no vitals filed for this visit. Wt Readings from Last 3 Encounters:  08/14/19 149 lb 9.6 oz (67.9 kg)  07/25/19 143 lb 8 oz (65.1 kg)  04/27/19 147 lb (66.7 kg)   There is no height or weight on file to calculate BMI.    ECOG FS:1 - Symptomatic but completely ambulatory  Telehealth Visit 08/30/19   LABORATORY DATA:  I have  reviewed the data as listed  . CBC Latest Ref Rng & Units 08/14/2019 07/25/2019 07/04/2018  WBC 4.0 - 10.5 K/uL 5.6 7.7 6.7  Hemoglobin 12.0 - 15.0 g/dL 13.5 16.6(H) 14.6  Hematocrit 36.0 - 46.0 % 40.0 48.9(H) 42.9  Platelets 150 - 400 K/uL 237 284.0 253.0    . CMP Latest Ref Rng & Units 08/14/2019 07/25/2019 07/04/2018  Glucose 70 - 99 mg/dL 97 111(H) 108(H)  BUN 8 - 23 mg/dL 8 17 11   Creatinine 0.44 - 1.00 mg/dL 0.75 0.86 0.67  Sodium 135 - 145 mmol/L 144 138 140  Potassium 3.5 - 5.1 mmol/L 3.6 4.3 4.0  Chloride 98 - 111 mmol/L 105 99 103  CO2 22 - 32 mmol/L 31 30 28   Calcium 8.9 - 10.3 mg/dL 9.0 10.0 9.6  Total Protein 6.5 - 8.1 g/dL 7.0 8.0 7.2  Total Bilirubin 0.3 - 1.2 mg/dL 0.3 0.6 0.2  Alkaline Phos 38 - 126 U/L 142(H) 129(H) 114  AST 15 - 41 U/L 18 13 14   ALT 0 - 44 U/L 35 18 18     RADIOGRAPHIC STUDIES: I have personally reviewed the radiological images as listed and agreed with the findings in the report. No results found.  ASSESSMENT & PLAN:   63 yo with   1) Polycythemia- borderline Likely secondary to dehydration from limited po fluid intake and diuretics. PLAN: A&P: -Discussed pt labwork today, 04/26/19;  all values are WNL except for Albumin at 3.4, Alkaline Phosphatase at 142    JAK2 pending. -Repeated blood counts are normal -Pt's polycythemia is likely due to dehydration, pt does not drink the recommended amount of water daily, drinks a high volume of tea, and is taking a diuretic -Recommended that the pt eat well and drink at least 48-64 oz of water each day. -Continue Lozol -Will continue to follow up with Dr.Jones   FOLLOW UP: Follow-up with PCP  The total time spent in the appt was 15 minutes and more than 50% was on counseling and direct patient cares.  All of the patient's questions were answered  with apparent satisfaction. The patient knows to call the clinic with any problems, questions or concerns.     Sullivan Lone MD MS AAHIVMS [facility]  [facility] Hematology/Oncology Physician [facility]  (Office):       [phone] (Work cell):  [phone] (Fax):           [phone]  08/30/2019 6:30 AM  I, Scot Dock, am acting as a scribe for Dr. Sullivan Lone.   .I have reviewed the above documentation for accuracy and completeness, and I agree with the above. Brunetta Genera MD

## 2019-08-31 ENCOUNTER — Telehealth: Payer: Self-pay | Admitting: Hematology

## 2019-08-31 NOTE — Telephone Encounter (Signed)
Per 12/23 los Follow-up with PCP

## 2019-09-15 LAB — JAK2 (INCLUDING V617F AND EXON 12), MPL,& CALR-NEXT GEN SEQ

## 2019-09-18 NOTE — Progress Notes (Signed)
  Subjective:  Patient ID: Julie Rivas, female    DOB: 20-Oct-1955,  MRN: [id number]  Chief Complaint  Patient presents with  . Foot Pain    Pt states right plantar heel has been hurting in the same way as the left. Pt states the left plantar heel pain has gotten worse.    64 y.o. female presents with the above complaint. Hx as above.  Review of Systems: Negative except as noted in the HPI. Denies N/V/F/Ch.  Past Medical History:  Diagnosis Date  . Arthritis   . Asthma     Current Outpatient Medications:  .  albuterol (PROVENTIL) (2.5 MG/3ML) 0.083% nebulizer solution, Take 3 mLs (2.5 mg total) by nebulization every 6 (six) hours as needed for wheezing or shortness of breath., Disp: 150 mL, Rfl: 2 .  cyclobenzaprine (FLEXERIL) 10 MG tablet, TAKE 1 TABLET UP TO THREE TIMES DAILY., Disp: 90 tablet, Rfl: 2 .  fluticasone (FLONASE) 50 MCG/ACT nasal spray, Place 2 sprays into both nostrils daily., Disp: 16 g, Rfl: 11 .  fluticasone furoate-vilanterol (BREO ELLIPTA) 200-25 MCG/INH AEPB, Inhale 1 puff into the lungs daily., Disp: 90 each, Rfl: 3 .  gabapentin (NEURONTIN) 300 MG capsule, TAKE 1 CAPSULE AT BEDTIME., Disp: 90 capsule, Rfl: 3 .  loratadine (CLARITIN) 10 MG tablet, Take 1 tablet (10 mg total) by mouth daily., Disp: 30 tablet, Rfl: 11 .  montelukast (SINGULAIR) 10 MG tablet, TAKE ONE TABLET AT BEDTIME., Disp: 90 tablet, Rfl: 1 .  traMADol (ULTRAM) 50 MG tablet, Take 1 tablet (50 mg total) by mouth every 8 (eight) hours as needed., Disp: 90 tablet, Rfl: 3 .  indapamide (LOZOL) 1.25 MG tablet, Take 1 tablet (1.25 mg total) by mouth daily., Disp: 90 tablet, Rfl: 0 .  omeprazole (PRILOSEC) 40 MG capsule, Take 1 capsule (40 mg total) by mouth daily., Disp: 30 capsule, Rfl: 1 .  rosuvastatin (CRESTOR) 20 MG tablet, Take 1 tablet (20 mg total) by mouth daily., Disp: 90 tablet, Rfl: 1 .  triamcinolone (NASACORT) 55 MCG/ACT AERO nasal inhaler, Place 2 sprays into the nose daily., Disp: 1  Inhaler, Rfl: 12  Social History   Tobacco Use  Smoking Status Never Smoker  Smokeless Tobacco Never Used    Allergies  Allergen Reactions  . Codeine Shortness Of Breath and Swelling   Objective:  There were no vitals filed for this visit. There is no height or weight on file to calculate BMI. Constitutional Well developed. Well nourished.  Vascular Dorsalis pedis pulses palpable bilaterally. Posterior tibial pulses palpable bilaterally. Capillary refill normal to all digits.  No cyanosis or clubbing noted. Pedal hair growth normal.  Neurologic Normal speech. Oriented to person, place, and time. Epicritic sensation to light touch grossly present bilaterally.  Dermatologic Nails well groomed and normal in appearance. No open wounds. Punctate hyperkeratosis left 4th   Orthopedic: Normal joint ROM without pain or crepitus bilaterally. No visible deformities. Tender to palpation at the calcaneal tuber bilat. No pain with calcaneal squeeze left. Ankle ROM diminished range of motion left. Silfverskiold Test: positive left.  POP Left 4th MPJ   Radiographs: None  Assessment:   1. Plantar fasciitis    Plan:  Patient was evaluated and treated and all questions answered.  Plantar Fasciitis, bilat - Continue stretching and icing. - DME: PF dipsensed right -Follow-up in 6 weeks for recheck   No follow-ups on file.

## 2019-10-29 ENCOUNTER — Ambulatory Visit: Payer: BC Managed Care – PPO | Attending: Internal Medicine

## 2019-10-29 DIAGNOSIS — Z23 Encounter for immunization: Secondary | ICD-10-CM | POA: Insufficient documentation

## 2019-10-29 NOTE — Progress Notes (Signed)
   Covid-19 Vaccination Clinic  Name:  CHAPEL POKORNY    MRN: [id number] DOB: 06/10/1956  10/29/2019  Ms. Leatherbury was observed post Covid-19 immunization for 30 minutes based on pre-vaccination screening without incidence. She was provided with Vaccine Information Sheet and instruction to access the V-Safe system.   Ms. Dalto was instructed to call 911 with any severe reactions post vaccine: Marland Kitchen Difficulty breathing  . Swelling of your face and throat  . A fast heartbeat  . A bad rash all over your body  . Dizziness and weakness    Immunizations Administered    Name Date Dose VIS Date Route   Pfizer COVID-19 Vaccine 10/29/2019 10:16 AM 0.3 mL 08/18/2019 Intramuscular   Manufacturer: [place]   Lot: [id number]   [place]: [id number]

## 2019-11-22 ENCOUNTER — Ambulatory Visit: Payer: BC Managed Care – PPO | Attending: Family Medicine

## 2019-11-22 DIAGNOSIS — Z23 Encounter for immunization: Secondary | ICD-10-CM

## 2019-11-22 NOTE — Progress Notes (Signed)
   Covid-19 Vaccination Clinic  Name:  ALLEX KASSON    MRN: [id number] DOB: [birth year]  11/22/2019  Ms. Califano was observed post Covid-19 immunization for 15 minutes without incident. She was provided with Vaccine Information Sheet and instruction to access the V-Safe system.   Ms. Defusco was instructed to call 911 with any severe reactions post vaccine: Marland Kitchen Difficulty breathing  . Swelling of face and throat  . A fast heartbeat  . A bad rash all over body  . Dizziness and weakness   Immunizations Administered    Name Date Dose VIS Date Route   Pfizer COVID-19 Vaccine 11/22/2019 12:38 PM 0.3 mL 08/18/2019 Intramuscular   Manufacturer: [place]   Lot: [id number]   [place]: [id number]

## 2019-12-25 ENCOUNTER — Encounter: Payer: Self-pay | Admitting: Internal Medicine

## 2019-12-25 ENCOUNTER — Other Ambulatory Visit: Payer: Self-pay

## 2019-12-25 ENCOUNTER — Ambulatory Visit (INDEPENDENT_AMBULATORY_CARE_PROVIDER_SITE_OTHER): Payer: Medicare Other | Admitting: Internal Medicine

## 2019-12-25 VITALS — BP 146/84 | HR 84 | Temp 98.5°F | Resp 16 | Ht 62.0 in | Wt 146.2 lb

## 2019-12-25 DIAGNOSIS — I10 Essential (primary) hypertension: Secondary | ICD-10-CM

## 2019-12-25 DIAGNOSIS — M19012 Primary osteoarthritis, left shoulder: Secondary | ICD-10-CM | POA: Diagnosis not present

## 2019-12-25 DIAGNOSIS — M19011 Primary osteoarthritis, right shoulder: Secondary | ICD-10-CM | POA: Diagnosis not present

## 2019-12-25 MED ORDER — RELAFEN DS 1000 MG PO TABS: 1.0000 | ORAL_TABLET | Freq: Every day | ORAL | 0 refills | Status: DC | PRN

## 2019-12-25 NOTE — Patient Instructions (Signed)
Shoulder Pain Many things can cause shoulder pain, including:  An injury to the shoulder.  Overuse of the shoulder.  Arthritis. The source of the pain can be:  Inflammation.  An injury to the shoulder joint.  An injury to a tendon, ligament, or bone. Follow these instructions at home: Pay attention to changes in your symptoms. Let your health care provider know about them. Follow these instructions to relieve your pain. If you have a sling:  Wear the sling as told by your health care provider. Remove it only as told by your health care provider.  Loosen the sling if your fingers tingle, become numb, or turn cold and blue.  Keep the sling clean.  If the sling is not waterproof: ? Do not let it get wet. Remove it to shower or bathe.  Move your arm as little as possible, but keep your hand moving to prevent swelling. Managing pain, stiffness, and swelling   If directed, put ice on the painful area: ? Put ice in a plastic bag. ? Place a towel between your skin and the bag. ? Leave the ice on for 20 minutes, 2-3 times per day. Stop applying ice if it does not help with the pain.  Squeeze a soft ball or a foam pad as much as possible. This helps to keep the shoulder from swelling. It also helps to strengthen the arm. General instructions  Take over-the-counter and prescription medicines only as told by your health care provider.  Keep all follow-up visits as told by your health care provider. This is important. Contact a health care provider if:  Your pain gets worse.  Your pain is not relieved with medicines.  New pain develops in your arm, hand, or fingers. Get help right away if:  Your arm, hand, or fingers: ? Tingle. ? Become numb. ? Become swollen. ? Become painful. ? Turn white or blue. Summary  Shoulder pain can be caused by an injury, overuse, or arthritis.  Pay attention to changes in your symptoms. Let your health care provider know about  them.  This condition may be treated with a sling, ice, and pain medicines.  Contact your health care provider if the pain gets worse or new pain develops. Get help right away if your arm, hand, or fingers tingle or become numb, swollen, or painful.  Keep all follow-up visits as told by your health care provider. This is important. This information is not intended to replace advice given to you by your health care provider. Make sure you discuss any questions you have with your health care provider. Document Revised: 03/08/2018 Document Reviewed: 03/08/2018 Elsevier Patient Education  2020 [employer]  

## 2019-12-25 NOTE — Progress Notes (Signed)
Subjective:  Patient ID: Julie Rivas, female    DOB: March 01, 1956  Age: 64 y.o. MRN: [id number]  CC: Hypertension and Osteoarthritis  This visit occurred during the SARS-CoV-2 public health emergency.  Safety protocols were in place, including screening questions prior to the visit, additional usage of staff PPE, and extensive cleaning of exam room while observing appropriate contact time as indicated for disinfecting solutions.    HPI Julie Rivas presents for f/up - She complains of a several year history of worsening bilateral shoulder pain, more on the right than the left.  She had plain films done about a year and a half ago that showed osteoarthritis in both shoulders.  She has noticed a gradual decrease in the range of motion in her right shoulder.  She is not getting much symptom relief with over-the-counter doses of Advil and Tylenol.  She is also taking gabapentin and tramadol.  She says the pain interferes with her activities and sleep.  Outpatient Medications Prior to Visit  Medication Sig Dispense Refill  . albuterol (PROVENTIL) (2.5 MG/3ML) 0.083% nebulizer solution Take 3 mLs (2.5 mg total) by nebulization every 6 (six) hours as needed for wheezing or shortness of breath. 150 mL 2  . cyclobenzaprine (FLEXERIL) 10 MG tablet TAKE 1 TABLET UP TO THREE TIMES DAILY. 90 tablet 2  . fluticasone (FLONASE) 50 MCG/ACT nasal spray Place 2 sprays into both nostrils daily. 16 g 11  . fluticasone furoate-vilanterol (BREO ELLIPTA) 200-25 MCG/INH AEPB Inhale 1 puff into the lungs daily. 90 each 3  . gabapentin (NEURONTIN) 300 MG capsule TAKE 1 CAPSULE AT BEDTIME. 90 capsule 3  . indapamide (LOZOL) 1.25 MG tablet Take 1 tablet (1.25 mg total) by mouth daily. 90 tablet 0  . loratadine (CLARITIN) 10 MG tablet Take 1 tablet (10 mg total) by mouth daily. 30 tablet 11  . montelukast (SINGULAIR) 10 MG tablet TAKE ONE TABLET AT BEDTIME. 90 tablet 1  . omeprazole (PRILOSEC) 40 MG capsule Take 1  capsule (40 mg total) by mouth daily. 30 capsule 1  . rosuvastatin (CRESTOR) 20 MG tablet Take 1 tablet (20 mg total) by mouth daily. 90 tablet 1  . traMADol (ULTRAM) 50 MG tablet Take 1 tablet (50 mg total) by mouth every 8 (eight) hours as needed. 90 tablet 3  . triamcinolone (NASACORT) 55 MCG/ACT AERO nasal inhaler Place 2 sprays into the nose daily. 1 Inhaler 12   No facility-administered medications prior to visit.    ROS Review of Systems  Constitutional: Negative for appetite change, diaphoresis, fatigue and unexpected weight change.  HENT: Negative.   Eyes: Negative for visual disturbance.  Respiratory: Negative for cough, chest tightness, shortness of breath and wheezing.   Cardiovascular: Negative for chest pain, palpitations and leg swelling.  Gastrointestinal: Negative for abdominal pain, constipation, diarrhea, nausea and vomiting.  Endocrine: Negative.   Genitourinary: Negative.  Negative for difficulty urinating.  Musculoskeletal: Positive for arthralgias. Negative for back pain, myalgias and neck pain.  Skin: Negative.   Neurological: Negative.  Negative for dizziness, weakness, light-headedness and headaches.  Hematological: Negative for adenopathy. Does not bruise/bleed easily.  Psychiatric/Behavioral: Negative.     Objective:  BP (!) 146/84 (BP Location: Left Arm, Patient Position: Sitting, Cuff Size: Large)   Pulse 84   Temp 98.5 F (36.9 C) (Oral)   Resp 16   Ht 5\' 2"  (1.575 m)   Wt 146 lb 4 oz (66.3 kg)   SpO2 95%   BMI 26.75 kg/m  BP Readings from Last 3 Encounters:  12/25/19 (!) 146/84  08/14/19 139/87  07/25/19 (!) 130/98    Wt Readings from Last 3 Encounters:  12/25/19 146 lb 4 oz (66.3 kg)  08/14/19 149 lb 9.6 oz (67.9 kg)  07/25/19 143 lb 8 oz (65.1 kg)    Physical Exam Vitals reviewed.  Constitutional:      Appearance: Normal appearance.  HENT:     Nose: Nose normal.     Mouth/Throat:     Mouth: Mucous membranes are moist.  Eyes:       General: No scleral icterus.    Conjunctiva/sclera: Conjunctivae normal.  Cardiovascular:     Rate and Rhythm: Normal rate and regular rhythm.     Heart sounds: No murmur.  Pulmonary:     Effort: Pulmonary effort is normal.     Breath sounds: No stridor. No wheezing, rhonchi or rales.  Abdominal:     General: Abdomen is flat.     Palpations: There is no mass.     Tenderness: There is no abdominal tenderness. There is no guarding.  Musculoskeletal:        General: Tenderness present. No swelling or deformity.     Right shoulder: No swelling, deformity, tenderness, bony tenderness or crepitus. Decreased range of motion. Normal strength.     Left shoulder: No swelling, deformity, tenderness or bony tenderness. Decreased range of motion. Normal strength.     Cervical back: Neck supple.     Right lower leg: No edema.     Left lower leg: No edema.  Lymphadenopathy:     Cervical: No cervical adenopathy.  Skin:    General: Skin is warm and dry.  Neurological:     General: No focal deficit present.  Psychiatric:        Mood and Affect: Mood normal.     Lab Results  Component Value Date   WBC 5.6 08/14/2019   HGB 13.5 08/14/2019   HCT 40.0 08/14/2019   PLT 237 08/14/2019   GLUCOSE 97 08/14/2019   CHOL 290 (H) 07/25/2019   TRIG 149.0 07/25/2019   HDL 55.20 07/25/2019   LDLCALC 205 (H) 07/25/2019   ALT 35 08/14/2019   AST 18 08/14/2019   NA 144 08/14/2019   K 3.6 08/14/2019   CL 105 08/14/2019   CREATININE 0.75 08/14/2019   BUN 8 08/14/2019   CO2 31 08/14/2019   TSH 1.09 07/25/2019   PSA WNL 10/30/2015   INR 1.1 (H) 12/31/2015   HGBA1C 6.3 07/25/2019    MM 3D SCREEN BREAST BILATERAL  Result Date: 03/07/2019 CLINICAL DATA:  Screening. EXAM: DIGITAL SCREENING BILATERAL MAMMOGRAM WITH TOMO AND CAD COMPARISON:  Previous exam(s). ACR Breast Density Category b: There are scattered areas of fibroglandular density. FINDINGS: There are no findings suspicious for malignancy.  Images were processed with CAD. IMPRESSION: No mammographic evidence of malignancy. A result letter of this screening mammogram will be mailed directly to the patient. RECOMMENDATION: Screening mammogram in one year. (Code:SM-B-01Y) BI-RADS CATEGORY  1: Negative. Electronically Signed   By: Marin Olp M.D.   On: 03/07/2019 17:20    Assessment & Plan:   Khaza was seen today for hypertension and osteoarthritis.  Diagnoses and all orders for this visit:  Primary osteoarthritis of both shoulders- I recommended that she stop taking over-the-counter doses of Advil and to upgrade to a prescription strength NSAID with nabumetone.  Will continue tramadol and Tylenol.  I have asked her to see sports medicine to see  if she would benefit from additional evaluation and treatment options. -     Ambulatory referral to Sports Medicine -     Nabumetone (RELAFEN DS) 1000 MG TABS; Take 1 tablet by mouth daily as needed.  Essential hypertension- Her blood pressure is not adequately well controlled.  She was encouraged to improve her lifestyle modifications.   I am having Julie Rivas start on Relafen DS. I am also having her maintain her fluticasone, fluticasone furoate-vilanterol, albuterol, montelukast, gabapentin, loratadine, traMADol, cyclobenzaprine, indapamide, rosuvastatin, omeprazole, and triamcinolone.  Meds ordered this encounter  Medications  . Nabumetone (RELAFEN DS) 1000 MG TABS    Sig: Take 1 tablet by mouth daily as needed.    Dispense:  6 tablet    Refill:  0     Follow-up: Return in about 3 months (around 03/25/2020).  Scarlette Calico, MD

## 2019-12-28 ENCOUNTER — Other Ambulatory Visit: Payer: Self-pay

## 2019-12-28 ENCOUNTER — Encounter: Payer: Self-pay | Admitting: Family Medicine

## 2019-12-28 ENCOUNTER — Ambulatory Visit (INDEPENDENT_AMBULATORY_CARE_PROVIDER_SITE_OTHER): Payer: BC Managed Care – PPO | Admitting: Family Medicine

## 2019-12-28 ENCOUNTER — Ambulatory Visit: Payer: Self-pay

## 2019-12-28 ENCOUNTER — Ambulatory Visit (INDEPENDENT_AMBULATORY_CARE_PROVIDER_SITE_OTHER): Payer: BC Managed Care – PPO

## 2019-12-28 VITALS — BP 120/70 | HR 99 | Ht 62.0 in | Wt 150.4 lb

## 2019-12-28 DIAGNOSIS — M25512 Pain in left shoulder: Secondary | ICD-10-CM

## 2019-12-28 DIAGNOSIS — M25511 Pain in right shoulder: Secondary | ICD-10-CM

## 2019-12-28 DIAGNOSIS — M19012 Primary osteoarthritis, left shoulder: Secondary | ICD-10-CM

## 2019-12-28 DIAGNOSIS — M19011 Primary osteoarthritis, right shoulder: Secondary | ICD-10-CM

## 2019-12-28 DIAGNOSIS — G8929 Other chronic pain: Secondary | ICD-10-CM

## 2019-12-28 NOTE — Progress Notes (Signed)
Subjective:    CC: B shoulder pain  I, Molly Weber, LAT, ATC, am serving as scribe for Dr. [name].  HPI: Pt is a 65 y/o female presenting w/ c/o B shoulder pain, R>L, x couple years.  She rates her pain as moderate during the day and severe at night and describes her pain as aching.  Pain located lateral upper arms.  Pain worse with activity but also bothersome at bedtime.  Mild arthritis on x-ray January 2020 bilateral shoulders.  Radiating pain: yes into her R arms Shoulder mechanical symptoms: No Aggravating factors: shoulder AROM; pain w/ ADLs and IADLs such as dressing and brushing her teeth Treatments tried: Advil, Tylenol, Gabapentin, Flexeril  Diagnostic testing: B shoulder XR- 09/21/18  Pertinent review of Systems: No fevers or chills  Relevant historical information: Hypertension, Prediabetes, hyperlipidemia   Objective:    Vitals:   12/28/19 1426  BP: 120/70  Pulse: 99  SpO2: 95%   General: Well Developed, well nourished, and in no acute distress.   MSK: C-spine normal-appearing nontender normal motion. Right shoulder normal-appearing Nontender. Range of motion abduction 120 degrees. External rotation full. Internal rotation lumbar spine. Strength within limits of range of motion intact abduction external/internal rotation. Minimally positive Hawkins and Neer's test. Negative Yergason's and speeds test  Left shoulder: Normal-appearing Nontender. Full range of motion duction external rotation.  Internal rotation lumbar spine.. Strength intact. Negative Hawkins and Neer's test. Negative Yergason's and speeds test.   Lab and Radiology Results  X-ray images shoulders bilateral obtained today personally independent reviewed  Right shoulder: Mild glenohumeral DJD.  Mild AC DJD.  No acute fractures.  Left shoulder: Mild glenohumeral DJD.  Moderate AC DJD.  No acute fractures.  Await formal radiology review  Procedure: Real-time Ultrasound  Guided Injection of right shoulder glenohumeral joint Device: Philips Affiniti 50G Images permanently stored and available for review in the ultrasound unit. Verbal informed consent obtained.  Discussed risks and benefits of procedure. Warned about infection bleeding damage to structures skin hypopigmentation and fat atrophy among others. Patient expresses understanding and agreement Time-out conducted.   Noted no overlying erythema, induration, or other signs of local infection.   Skin prepped in a sterile fashion.   Local anesthesia: Topical Ethyl chloride.   With sterile technique and under real time ultrasound guidance:  40 mg of Kenalog and 3 mL of Marcaine injected easily.   Completed without difficulty   Pain partially resolved suggesting accurate placement of the medication.   Advised to call if fevers/chills, erythema, induration, drainage, or persistent bleeding.   Images permanently stored and available for review in the ultrasound unit.  Impression: Technically successful ultrasound guided injection.     Impression and Recommendations:    Assessment and Plan: 64 y.o. female with bilateral shoulder pain right worse than left ongoing for years.  Discussed treatment options at this point.  Plan for glenohumeral injection.  Patient had significant pain improvement following injection indicating that glenohumeral joint is main pain generator.  She did have some residual pain indicate that she probably does have some rotator cuff pathology as well. Plan for Voltaren gel trial and continue Relafen as needed as prescribed by PCP. She notes that she does have a nephew who visits her frequently that is a physical therapist and that she can receive some home physical therapy from that.  She thinks that she cannot afford regular conventional physical therapy at this point.  Encourage patient to continue home PT sessions. Recheck back  with me as needed.  Happy to proceed with contralateral  left-sided injection or trials of subacromial injection as needed in the future.  These would be reasonable neck steps.Marland Kitchen  PDMP not reviewed this encounter. Orders Placed This Encounter  Procedures  . [place] LIMITED JOINT SPACE STRUCTURES UP BILAT(NO LINKED CHARGES)    Order Specific Question:   Reason for Exam (SYMPTOM  OR DIAGNOSIS REQUIRED)    Answer:   B shoulder pain    Order Specific Question:   Preferred imaging location?    Answer:   [facility]  . DG Shoulder Right    Standing Status:   Future    Number of Occurrences:   1    Standing Expiration Date:   02/26/2021    Order Specific Question:   Reason for Exam (SYMPTOM  OR DIAGNOSIS REQUIRED)    Answer:   eval shoulder pain    Order Specific Question:   Preferred imaging location?    Answer:   [facility]    Order Specific Question:   Radiology Contrast Protocol - do NOT remove file path    Answer:   \\[email or web address]  . DG Shoulder Left    Standing Status:   Future    Number of Occurrences:   1    Standing Expiration Date:   02/26/2021    Order Specific Question:   Reason for Exam (SYMPTOM  OR DIAGNOSIS REQUIRED)    Answer:   eval shoulder pain    Order Specific Question:   Preferred imaging location?    Answer:   [facility]    Order Specific Question:   Radiology Contrast Protocol - do NOT remove file path    Answer:   \\[email or web address]\[email or web address]\DXFluoroContrastProtocols.pdf   No orders of the defined types were placed in this encounter.   Discussed warning signs or symptoms. Please see discharge instructions. Patient expresses understanding.   The above documentation has been reviewed and is accurate and complete [name]

## 2019-12-28 NOTE — Patient Instructions (Signed)
Thank you for coming in today. Ok to use ralefen as needed.  Use voltaren gel over the counter.  Ok to use 4x daily.  Continue home PT.   Return as needed.   Call or go to the ER if you develop a large red swollen joint with extreme pain or oozing puss.

## 2019-12-29 NOTE — Progress Notes (Signed)
Right shoulder x-ray shows some arthritis

## 2019-12-29 NOTE — Progress Notes (Signed)
Left shoulder x-ray shows some arthritis as well.

## 2020-01-08 ENCOUNTER — Encounter: Payer: Self-pay | Admitting: Family

## 2020-01-08 ENCOUNTER — Other Ambulatory Visit: Payer: Self-pay

## 2020-01-08 ENCOUNTER — Ambulatory Visit (INDEPENDENT_AMBULATORY_CARE_PROVIDER_SITE_OTHER): Payer: Medicare Other | Admitting: Family

## 2020-01-08 ENCOUNTER — Ambulatory Visit (INDEPENDENT_AMBULATORY_CARE_PROVIDER_SITE_OTHER): Payer: BC Managed Care – PPO

## 2020-01-08 VITALS — BP 140/100 | HR 107 | Temp 98.0°F | Ht 62.0 in | Wt 139.8 lb

## 2020-01-08 DIAGNOSIS — R1011 Right upper quadrant pain: Secondary | ICD-10-CM

## 2020-01-08 DIAGNOSIS — R42 Dizziness and giddiness: Secondary | ICD-10-CM

## 2020-01-08 DIAGNOSIS — R079 Chest pain, unspecified: Secondary | ICD-10-CM | POA: Diagnosis not present

## 2020-01-08 LAB — COMPREHENSIVE METABOLIC PANEL
ALT: 16 U/L (ref 0–35)
AST: 20 U/L (ref 0–37)
Albumin: 4.6 g/dL (ref 3.5–5.2)
Alkaline Phosphatase: 111 U/L (ref 39–117)
BUN: 27 mg/dL — ABNORMAL HIGH (ref 6–23)
CO2: 28 mEq/L (ref 19–32)
Calcium: 10.2 mg/dL (ref 8.4–10.5)
Chloride: 96 mEq/L (ref 96–112)
Creatinine, Ser: 1.22 mg/dL — ABNORMAL HIGH (ref 0.40–1.20)
GFR: 53.37 mL/min — ABNORMAL LOW (ref >60.00)
Glucose, Bld: 110 mg/dL — ABNORMAL HIGH (ref 70–99)
Potassium: 4.1 mEq/L (ref 3.5–5.1)
Sodium: 135 mEq/L (ref 135–145)
Total Bilirubin: 0.7 mg/dL (ref 0.2–1.2)
Total Protein: 8.4 g/dL — ABNORMAL HIGH (ref 6.0–8.3)

## 2020-01-08 LAB — CBC WITH DIFFERENTIAL/PLATELET
Basophils Absolute: 0 10*3/uL (ref 0.0–0.1)
Basophils Relative: 0.5 % (ref 0.0–3.0)
Eosinophils Absolute: 0 10*3/uL (ref 0.0–0.7)
Eosinophils Relative: 0.5 % (ref 0.0–5.0)
HCT: 51.3 % — ABNORMAL HIGH (ref 36.0–46.0)
Hemoglobin: 17.3 g/dL — ABNORMAL HIGH (ref 12.0–15.0)
Lymphocytes Relative: 29.9 % (ref 12.0–46.0)
Lymphs Abs: 2.6 10*3/uL (ref 0.7–4.0)
MCHC: 33.7 g/dL (ref 30.0–36.0)
MCV: 95.8 fl (ref 78.0–100.0)
Monocytes Absolute: 0.8 10*3/uL (ref 0.1–1.0)
Monocytes Relative: 8.7 % (ref 3.0–12.0)
Neutro Abs: 5.3 10*3/uL (ref 1.4–7.7)
Neutrophils Relative %: 60.4 % (ref 43.0–77.0)
Platelets: 273 10*3/uL (ref 150.0–400.0)
RBC: 5.35 Mil/uL — ABNORMAL HIGH (ref 3.87–5.11)
RDW: 13.2 % (ref 11.5–15.5)
WBC: 8.8 10*3/uL (ref 4.0–10.5)

## 2020-01-08 LAB — POCT GLUCOSE (DEVICE FOR HOME USE): POC Glucose: 108 mg/dl — AB (ref 70–99)

## 2020-01-08 NOTE — Progress Notes (Signed)
Julie Rivas is a 65 y.o. female with the following history as recorded in [employer]:  Patient Active Problem List   Diagnosis Date Noted  . Laryngitis, chronic 07/25/2019  . Callous ulcer, limited to breakdown of skin ([place]) 04/27/2019  . Essential hypertension 09/21/2018  . Primary osteoarthritis of both shoulders 07/04/2018  . Prediabetes 02/18/2017  . Carpal tunnel syndrome on both sides 12/03/2016  . Hyperlipidemia with target LDL less than 130 10/31/2015  . Allergic rhinitis due to pollen 10/30/2015  . Asthma, moderate persistent 10/30/2015  . Routine general medical examination at a health care facility 10/30/2015  . Sciatica of right side 10/30/2015    Current Outpatient Medications  Medication Sig Dispense Refill  . albuterol (PROVENTIL) (2.5 MG/3ML) 0.083% nebulizer solution Take 3 mLs (2.5 mg total) by nebulization every 6 (six) hours as needed for wheezing or shortness of breath. 150 mL 2  . cyclobenzaprine (FLEXERIL) 10 MG tablet TAKE 1 TABLET UP TO THREE TIMES DAILY. 90 tablet 2  . fluticasone (FLONASE) 50 MCG/ACT nasal spray Place 2 sprays into both nostrils daily. 16 g 11  . fluticasone furoate-vilanterol (BREO ELLIPTA) 200-25 MCG/INH AEPB Inhale 1 puff into the lungs daily. 90 each 3  . gabapentin (NEURONTIN) 300 MG capsule TAKE 1 CAPSULE AT BEDTIME. 90 capsule 3  . indapamide (LOZOL) 1.25 MG tablet Take 1 tablet (1.25 mg total) by mouth daily. 90 tablet 0  . loratadine (CLARITIN) 10 MG tablet Take 1 tablet (10 mg total) by mouth daily. 30 tablet 11  . montelukast (SINGULAIR) 10 MG tablet TAKE ONE TABLET AT BEDTIME. 90 tablet 1  . Nabumetone (RELAFEN DS) 1000 MG TABS Take 1 tablet by mouth daily as needed. 6 tablet 0  . omeprazole (PRILOSEC) 40 MG capsule Take 1 capsule (40 mg total) by mouth daily. 30 capsule 1  . rosuvastatin (CRESTOR) 20 MG tablet Take 1 tablet (20 mg total) by mouth daily. 90 tablet 1  . traMADol (ULTRAM) 50 MG tablet Take 1 tablet (50 mg total)  by mouth every 8 (eight) hours as needed. 90 tablet 3  . triamcinolone (NASACORT) 55 MCG/ACT AERO nasal inhaler Place 2 sprays into the nose daily. 1 Inhaler 12   No current facility-administered medications for this visit.    Allergies: Codeine  Past Medical History:  Diagnosis Date  . Arthritis   . Asthma     Past Surgical History:  Procedure Laterality Date  . ECTOPIC PREGNANCY SURGERY     has partial right fallopian tube    Family History  Problem Relation Age of Onset  . Heart failure Mother   . Arthritis Mother   . Cancer Father   . Hypertension Father   . Diabetes Sister   . Colon cancer Neg Hx   . Esophageal cancer Neg Hx   . Stomach cancer Neg Hx   . Rectal cancer Neg Hx   . Hyperlipidemia Neg Hx   . Heart disease Neg Hx   . Early death Neg Hx   . Depression Neg Hx     Social History   Tobacco Use  . Smoking status: Never Smoker  . Smokeless tobacco: Never Used  Substance Use Topics  . Alcohol use: No    Subjective:  Patient presents with complaints of feeling bad since 4/24- got a cortisone shot on 4/22 and notes that she has just not felt good since that time; has been feeling off balance- "have to steady myself after getting up from a sitting position."  Feels that her blood pressure has been fluctuating- notes that her blood pressure was 165/115 at home this morning before coming to our office; pulse level is up today; no prior history of A. Fib- does not feel like her heart is beating irregularly;      Objective:  Vitals:   01/08/20 1039 01/08/20 1040  BP: (!) 122/100 (!) 128/98  Pulse: (!) 116 (!) 107  Temp: 98 F (36.7 C)   TempSrc: Oral   SpO2: 99% 97%  Weight: 139 lb 12.8 oz (63.4 kg)   Height: '5\' 2"'  (1.575 m)     General: Well developed, well nourished, in no acute distress  Skin : Warm and dry.  Head: Normocephalic and atraumatic  Eyes: Sclera and conjunctiva clear; pupils round and reactive to light; extraocular movements intact  Ears:  External normal; canals clear; tympanic membranes normal  Oropharynx: Pink, supple. No suspicious lesions  Neck: Supple without thyromegaly, adenopathy  Lungs: Respirations unlabored; clear to auscultation bilaterally without wheeze, rales, rhonchi  CVS exam: normal rate and regular rhythm.  Vessels: Symmetric bilaterally  Neurologic: Alert and oriented; speech intact; face symmetrical; moves all extremities well; CNII-XII intact without focal deficit   Assessment:  1. Dizziness   2. RUQ pain     Plan:  EKG in office showed sinus tachycardia with a pulse of 112; I am concerned that she may have been experiencing some episodes of A. Fib but exam and EKG here are normal; she may also be having some type of prolonged allergic reaction to the cortisone shot as this was the trigger for her symptoms. Blood glucose in office was 108; check CBC, CMP, CXR today; she is asked to increase her Lozol dosage to 2.5 mg and return to office in 48 hours;  may need cardiology consult; strict ER precautions;  This visit occurred during the SARS-CoV-2 public health emergency.  Safety protocols were in place, including screening questions prior to the visit, additional usage of staff PPE, and extensive cleaning of exam room while observing appropriate contact time as indicated for disinfecting solutions.      No follow-ups on file.  Orders Placed This Encounter  Procedures  . DG Chest 2 View    Standing Status:   Future    Number of Occurrences:   1    Standing Expiration Date:   03/09/2021    Order Specific Question:   Reason for Exam (SYMPTOM  OR DIAGNOSIS REQUIRED)    Answer:   RUQ pain    Order Specific Question:   Preferred imaging location?    Answer:   [facility]    Order Specific Question:   Radiology Contrast Protocol - do NOT remove file path    Answer:   \\[email or web address]\Rad[facility]\DXFluoroContrastProtocols.pdf  . CBC with Differential/Platelet  . Comp Met (CMET)  . POCT Glucose  (Device for Home Use)  . EKG 12-Lead    Requested Prescriptions    No prescriptions requested or ordered in this encounter

## 2020-01-10 ENCOUNTER — Ambulatory Visit (INDEPENDENT_AMBULATORY_CARE_PROVIDER_SITE_OTHER): Payer: Medicare Other | Admitting: Internal Medicine

## 2020-01-10 ENCOUNTER — Encounter: Payer: Self-pay | Admitting: Internal Medicine

## 2020-01-10 ENCOUNTER — Other Ambulatory Visit: Payer: Self-pay

## 2020-01-10 ENCOUNTER — Ambulatory Visit (INDEPENDENT_AMBULATORY_CARE_PROVIDER_SITE_OTHER): Payer: BC Managed Care – PPO

## 2020-01-10 VITALS — BP 124/86 | HR 63 | Temp 98.9°F | Resp 16 | Ht 62.0 in | Wt 139.0 lb

## 2020-01-10 DIAGNOSIS — R7303 Prediabetes: Secondary | ICD-10-CM | POA: Diagnosis not present

## 2020-01-10 DIAGNOSIS — F4320 Adjustment disorder, unspecified: Secondary | ICD-10-CM

## 2020-01-10 DIAGNOSIS — N1832 Chronic kidney disease, stage 3b: Secondary | ICD-10-CM | POA: Diagnosis not present

## 2020-01-10 DIAGNOSIS — R10817 Generalized abdominal tenderness: Secondary | ICD-10-CM

## 2020-01-10 DIAGNOSIS — I10 Essential (primary) hypertension: Secondary | ICD-10-CM | POA: Diagnosis not present

## 2020-01-10 DIAGNOSIS — K5900 Constipation, unspecified: Secondary | ICD-10-CM | POA: Diagnosis not present

## 2020-01-10 LAB — BASIC METABOLIC PANEL
BUN: 29 mg/dL — ABNORMAL HIGH (ref 6–23)
CO2: 29 mEq/L (ref 19–32)
Calcium: 10.1 mg/dL (ref 8.4–10.5)
Chloride: 97 mEq/L (ref 96–112)
Creatinine, Ser: 1.37 mg/dL — ABNORMAL HIGH (ref 0.40–1.20)
GFR: 46.69 mL/min — ABNORMAL LOW (ref >60.00)
Glucose, Bld: 195 mg/dL — ABNORMAL HIGH (ref 70–99)
Potassium: 4.1 mEq/L (ref 3.5–5.1)
Sodium: 135 mEq/L (ref 135–145)

## 2020-01-10 LAB — CBC WITH DIFFERENTIAL/PLATELET
Basophils Absolute: 0.1 10*3/uL (ref 0.0–0.1)
Basophils Relative: 1 % (ref 0.0–3.0)
Eosinophils Absolute: 0 10*3/uL (ref 0.0–0.7)
Eosinophils Relative: 0.2 % (ref 0.0–5.0)
HCT: 50.6 % — ABNORMAL HIGH (ref 36.0–46.0)
Hemoglobin: 17.2 g/dL — ABNORMAL HIGH (ref 12.0–15.0)
Lymphocytes Relative: 24.3 % (ref 12.0–46.0)
Lymphs Abs: 2.2 10*3/uL (ref 0.7–4.0)
MCHC: 34.1 g/dL (ref 30.0–36.0)
MCV: 94.6 fl (ref 78.0–100.0)
Monocytes Absolute: 0.5 10*3/uL (ref 0.1–1.0)
Monocytes Relative: 5.3 % (ref 3.0–12.0)
Neutro Abs: 6.3 10*3/uL (ref 1.4–7.7)
Neutrophils Relative %: 69.2 % (ref 43.0–77.0)
Platelets: 269 10*3/uL (ref 150.0–400.0)
RBC: 5.34 Mil/uL — ABNORMAL HIGH (ref 3.87–5.11)
RDW: 13.1 % (ref 11.5–15.5)
WBC: 9.2 10*3/uL (ref 4.0–10.5)

## 2020-01-10 LAB — AMYLASE: Amylase: 54 U/L (ref 27–131)

## 2020-01-10 LAB — URINALYSIS, ROUTINE W REFLEX MICROSCOPIC
Bilirubin Urine: NEGATIVE
Ketones, ur: NEGATIVE
Leukocytes,Ua: NEGATIVE
Nitrite: NEGATIVE
Specific Gravity, Urine: 1.025 (ref 1.000–1.030)
Total Protein, Urine: NEGATIVE
Urine Glucose: NEGATIVE
Urobilinogen, UA: 0.2 (ref 0.0–1.0)
pH: 6 (ref 5.0–8.0)

## 2020-01-10 LAB — LIPASE: Lipase: 43 U/L (ref 11.0–59.0)

## 2020-01-10 LAB — HEMOGLOBIN A1C: Hgb A1c MFr Bld: 6.4 % (ref 4.6–6.5)

## 2020-01-10 MED ORDER — CLONAZEPAM 0.5 MG PO TABS: 0.5000 mg | ORAL_TABLET | Freq: Three times a day (TID) | ORAL | 1 refills | Status: DC | PRN

## 2020-01-10 NOTE — Patient Instructions (Signed)
Chronic Kidney Disease, Adult Chronic kidney disease (CKD) occurs when the kidneys become damaged slowly over a long period of time. The kidneys are a pair of organs that do many important jobs in the body, including:  Removing waste and extra fluid from the blood to make urine.  Making hormones that maintain the amount of fluid in tissues and blood vessels.  Maintaining the right amount of fluids and chemicals in the body. A small amount of kidney damage may not cause problems, but a large amount of damage may make it hard or impossible for the kidneys to work the way they should. If steps are not taken to slow down kidney damage or to stop it from getting worse, the kidneys may stop working permanently (end-stage renal disease or ESRD). Most of the time, CKD does not go away, but it can often be controlled. People who have CKD are usually able to live normal lives. What are the causes? The most common causes of this condition are diabetes and high blood pressure (hypertension). Other causes include:  Heart and blood vessel (cardiovascular) disease.  Kidney diseases, such as: ? Glomerulonephritis. ? Interstitial nephritis. ? Polycystic kidney disease. ? Renal vascular disease.  Diseases that affect the immune system.  Genetic diseases.  Medicines that damage the kidneys, such as anti-inflammatory medicines.  Being around or being in contact with poisonous (toxic) substances.  A kidney or urinary infection that occurs again and again (recurs).  Vasculitis. This is swelling or inflammation of the blood vessels.  A problem with urine flow that may be caused by: ? Cancer. ? Having kidney stones more than one time. ? An enlarged prostate, in males. What increases the risk? You are more likely to develop this condition if you:  Are older than age 60.  Are female.  Are African-American, Hispanic, Asian, Pacific Islander, or American Indian.  Are a current or former smoker.   Are obese.  Have a family history of kidney disease or failure.  Often take medicines that are damaging to the kidneys. What are the signs or symptoms? Symptoms of this condition include:  Swelling (edema) of the face, legs, ankles, or feet.  Tiredness (lethargy) and having less energy.  Nausea or vomiting.  Confusion or trouble concentrating.  Problems with urination, such as: ? Painful or burning feeling during urination. ? Decreased urine production. ? Frequent urination, especially at night. ? Bloody urine.  Muscle twitches and cramps, especially in the legs.  Shortness of breath.  Weakness.  Loss of appetite.  Metallic taste in the mouth.  Trouble sleeping.  Dry, itchy skin.  A low blood count (anemia).  Pale lining of the eyelids and surface of the eye (conjunctiva). Symptoms develop slowly and may not be obvious until the kidney damage becomes severe. It is possible to have kidney disease for years without having any symptoms. How is this diagnosed? This condition may be diagnosed based on:  Blood tests.  Urine tests.  Imaging tests, such as an ultrasound or CT scan.  A test in which a sample of tissue is removed from the kidneys to be examined under a microscope (kidney biopsy). These test results will help your health care provider determine how serious the CKD is. How is this treated? There is no cure for most cases of this condition, but treatment usually relieves symptoms and prevents or slows the progression of the disease. Treatment may include:  Making diet changes, which may require you to avoid alcohol, salty foods (sodium),   and foods that are high in potassium, calcium, and protein.  Medicines: ? To lower blood pressure. ? To control blood glucose. ? To relieve anemia. ? To relieve swelling. ? To protect your bones. ? To improve the balance of electrolytes in your blood.  Removing toxic waste from the body through types of dialysis, if  the kidneys can no longer do their job (kidney failure).  Managing any other conditions that are causing your CKD or making it worse. Follow these instructions at home: Medicines  Take over-the-counter and prescription medicines only as told by your health care provider. The dose of some medicines that you take may need to be adjusted.  Do not take any new medicines unless approved by your health care provider. Many medicines can worsen your kidney damage.  Do not take any vitamin and mineral supplements unless approved by your health care provider. Many nutritional supplements can worsen your kidney damage. General instructions  Follow your prescribed diet as told by your health care provider.  Do not use any products that contain nicotine or tobacco, such as cigarettes and e-cigarettes. If you need help quitting, ask your health care provider.  Monitor and track your blood pressure at home. Report changes in your blood pressure as told by your health care provider.  If you are being treated for diabetes, monitor and track your blood sugar (blood glucose) levels as told by your health care provider.  Maintain a healthy weight. If you need help with this, ask your health care provider.  Start or continue an exercise plan. Exercise at least 30 minutes a day, 5 days a week.  Keep your immunizations up to date as told by your health care provider.  Keep all follow-up visits as told by your health care provider. This is important. Where to find more information  American Association of Kidney Patients: www.aakp.org  National Kidney Foundation: www.kidney.org  [employer] Kid[employer]: www.akfinc.org  [facility]: www.lifeoptions.org and www.kidneyschool.org Contact a health care provider if:  Your symptoms get worse.  You develop new symptoms. Get help right away if:  You develop symptoms of ESRD, which include: ? Headaches. ? Numbness in the hands or  feet. ? Easy bruising. ? Frequent hiccups. ? Chest pain. ? Shortness of breath. ? Lack of menstruation, in women.  You have a fever.  You have decreased urine production.  You have pain or bleeding when you urinate. Summary  Chronic kidney disease (CKD) occurs when the kidneys become damaged slowly over a long period of time.  The most common causes of this condition are diabetes and high blood pressure (hypertension).  There is no cure for most cases of this condition, but treatment usually relieves symptoms and prevents or slows the progression of the disease. Treatment may include a combination of medicines and lifestyle changes. This information is not intended to replace advice given to you by your health care provider. Make sure you discuss any questions you have with your health care provider. Document Revised: 08/06/2017 Document Reviewed: 10/01/2016 Elsevier Patient Education  2020 [employer]  

## 2020-01-10 NOTE — Progress Notes (Signed)
Subjective:  Patient ID: Julie Rivas, female    DOB: 11/08/1955  Age: 64 y.o. MRN: [id number]  CC: Abdominal Pain and Hypertension  This visit occurred during the SARS-CoV-2 public health emergency.  Safety protocols were in place, including screening questions prior to the visit, additional usage of staff PPE, and extensive cleaning of exam room while observing appropriate contact time as indicated for disinfecting solutions.    HPI Julie Rivas presents for f/up - She has an extensively positive review of systems including elevated blood pressure, nausea without vomiting, fatigue, headaches, facial pain, shortness of breath without cough, abdominal pain, chronic sore throat, chronic laryngitis, and weight loss.  She saw ENT about the sore throat and laryngitis and it was recommended that she take a PPI but she says it made the symptoms worse.  She saw someone else a few days ago and work-up was unremarkable.  She now admits that she is under quite a bit of stress.  She said her sister had a stroke recently and she is trying to take care of her.  She also says that her nephew is currently being arrested and she is his sole provider.  She complains of feeling anxious, nervous, and panicky.  She was prescribed nabumetone about a month ago but she said she took a few doses and did not continue taking it.  It also looks like she is no longer taking indapamide.  Outpatient Medications Prior to Visit  Medication Sig Dispense Refill  . albuterol (PROVENTIL) (2.5 MG/3ML) 0.083% nebulizer solution Take 3 mLs (2.5 mg total) by nebulization every 6 (six) hours as needed for wheezing or shortness of breath. 150 mL 2  . cyclobenzaprine (FLEXERIL) 10 MG tablet TAKE 1 TABLET UP TO THREE TIMES DAILY. 90 tablet 2  . fluticasone furoate-vilanterol (BREO ELLIPTA) 200-25 MCG/INH AEPB Inhale 1 puff into the lungs daily. 90 each 3  . gabapentin (NEURONTIN) 300 MG capsule TAKE 1 CAPSULE AT BEDTIME. 90 capsule 3    . loratadine (CLARITIN) 10 MG tablet Take 1 tablet (10 mg total) by mouth daily. 30 tablet 11  . montelukast (SINGULAIR) 10 MG tablet TAKE ONE TABLET AT BEDTIME. 90 tablet 1  . omeprazole (PRILOSEC) 40 MG capsule Take 1 capsule (40 mg total) by mouth daily. 30 capsule 1  . rosuvastatin (CRESTOR) 20 MG tablet Take 1 tablet (20 mg total) by mouth daily. 90 tablet 1  . traMADol (ULTRAM) 50 MG tablet Take 1 tablet (50 mg total) by mouth every 8 (eight) hours as needed. 90 tablet 3  . triamcinolone (NASACORT) 55 MCG/ACT AERO nasal inhaler Place 2 sprays into the nose daily. 1 Inhaler 12  . fluticasone (FLONASE) 50 MCG/ACT nasal spray Place 2 sprays into both nostrils daily. 16 g 11  . indapamide (LOZOL) 1.25 MG tablet Take 1 tablet (1.25 mg total) by mouth daily. 90 tablet 0  . Nabumetone (RELAFEN DS) 1000 MG TABS Take 1 tablet by mouth daily as needed. 6 tablet 0   No facility-administered medications prior to visit.    ROS Review of Systems  Constitutional: Positive for appetite change, fatigue and unexpected weight change. Negative for chills, diaphoresis and fever.  HENT: Positive for sore throat and voice change. Negative for sinus pressure, sinus pain and trouble swallowing.   Eyes: Negative.  Negative for visual disturbance.  Respiratory: Negative for cough, chest tightness, shortness of breath and wheezing.   Cardiovascular: Negative for chest pain, palpitations and leg swelling.  Gastrointestinal: Positive for  abdominal pain and nausea. Negative for abdominal distention, blood in stool, constipation, diarrhea and vomiting.  Endocrine: Negative.   Genitourinary: Negative.  Negative for decreased urine volume, difficulty urinating, dysuria, flank pain, frequency, hematuria and urgency.  Musculoskeletal: Negative.  Negative for arthralgias and myalgias.  Skin: Negative.  Negative for color change, pallor and rash.  Neurological: Positive for headaches. Negative for dizziness, seizures,  facial asymmetry, weakness and numbness.  Hematological: Negative for adenopathy. Does not bruise/bleed easily.  Psychiatric/Behavioral: Negative.     Objective:  BP 124/86 (BP Location: Left Arm, Patient Position: Sitting, Cuff Size: Normal)   Pulse 63   Temp 98.9 F (37.2 C) (Oral)   Resp 16   Ht 5\' 2"  (1.575 m)   Wt 139 lb (63 kg)   SpO2 99%   BMI 25.42 kg/m   BP Readings from Last 3 Encounters:  01/10/20 124/86  01/08/20 (!) 140/100  12/28/19 120/70    Wt Readings from Last 3 Encounters:  01/10/20 139 lb (63 kg)  01/08/20 139 lb 12.8 oz (63.4 kg)  12/28/19 150 lb 6.4 oz (68.2 kg)    Physical Exam Vitals reviewed.  Constitutional:      Appearance: She is well-developed.  HENT:     Nose: Nose normal.     Mouth/Throat:     Mouth: Mucous membranes are moist.  Eyes:     General: No scleral icterus.    Extraocular Movements: Extraocular movements intact.     Conjunctiva/sclera: Conjunctivae normal.  Cardiovascular:     Rate and Rhythm: Normal rate and regular rhythm.     Heart sounds: Normal heart sounds. No murmur.  Pulmonary:     Effort: Pulmonary effort is normal.     Breath sounds: No stridor. No wheezing, rhonchi or rales.  Abdominal:     General: Abdomen is flat. Bowel sounds are normal. There is no distension.     Palpations: Abdomen is soft. There is no hepatomegaly, splenomegaly or mass.     Tenderness: There is no abdominal tenderness. There is no guarding or rebound.     Hernia: No hernia is present.  Musculoskeletal:        General: Normal range of motion.     Cervical back: Neck supple.     Right lower leg: No edema.     Left lower leg: No edema.  Lymphadenopathy:     Cervical: No cervical adenopathy.  Skin:    General: Skin is warm and dry.     Coloration: Skin is not pale.  Neurological:     General: No focal deficit present.     Mental Status: She is alert and oriented to person, place, and time. Mental status is at baseline.    Psychiatric:        Attention and Perception: She is inattentive.        Mood and Affect: Mood is anxious and depressed. Affect is flat and tearful.        Speech: Speech normal.        Behavior: Behavior normal. Behavior is not agitated, slowed, aggressive or withdrawn. Behavior is cooperative.        Thought Content: Thought content normal. Thought content is not paranoid or delusional. Thought content does not include homicidal or suicidal ideation.     Lab Results  Component Value Date   WBC 9.2 01/10/2020   HGB 17.2 (H) 01/10/2020   HCT 50.6 (H) 01/10/2020   PLT 269.0 01/10/2020   GLUCOSE 195 (H) 01/10/2020  CHOL 290 (H) 07/25/2019   TRIG 149.0 07/25/2019   HDL 55.20 07/25/2019   LDLCALC 205 (H) 07/25/2019   ALT 16 01/08/2020   AST 20 01/08/2020   NA 135 01/10/2020   K 4.1 01/10/2020   CL 97 01/10/2020   CREATININE 1.37 (H) 01/10/2020   BUN 29 (H) 01/10/2020   CO2 29 01/10/2020   TSH 1.09 07/25/2019   PSA WNL 10/30/2015   INR 1.1 (H) 12/31/2015   HGBA1C 6.4 01/10/2020    MM 3D SCREEN BREAST BILATERAL  Result Date: 03/07/2019 CLINICAL DATA:  Screening. EXAM: DIGITAL SCREENING BILATERAL MAMMOGRAM WITH TOMO AND CAD COMPARISON:  Previous exam(s). ACR Breast Density Category b: There are scattered areas of fibroglandular density. FINDINGS: There are no findings suspicious for malignancy. Images were processed with CAD. IMPRESSION: No mammographic evidence of malignancy. A result letter of this screening mammogram will be mailed directly to the patient. RECOMMENDATION: Screening mammogram in one year. (Code:SM-B-01Y) BI-RADS CATEGORY  1: Negative. Electronically Signed   By: Marin Olp M.D.   On: 03/07/2019 17:20   DG ABD ACUTE 2+V W 1V CHEST  Result Date: 01/10/2020 CLINICAL DATA:  Generalized abdominal pain for 2 weeks. EXAM: DG ABDOMEN ACUTE W/ 1V CHEST COMPARISON:  Chest x-ray 09/14/2017 FINDINGS: The cardiac silhouette, mediastinal and hilar contours are normal. The  lungs are clear. Moderate stool throughout the colon and down into the rectum may suggest constipation. No distended small bowel loops to suggest obstruction. The soft tissue shadows are maintained. No worrisome calcifications. The bony structures are intact. Stable thoracolumbar scoliosis and mild bilateral hip joint degenerative changes. IMPRESSION: 1. No acute cardiopulmonary findings. 2. Moderate stool throughout the colon may suggest constipation. Electronically Signed   By: Marijo Sanes M.D.   On: 01/10/2020 11:33    Assessment & Plan:   Mairin was seen today for abdominal pain and hypertension.  Diagnoses and all orders for this visit:  Stage 3b chronic kidney disease- Her renal function has declined slightly and she has a prerenal azotemia.  I have asked her to be certain not to take indapamide or nabumetone anymore. -     CBC with Differential/Platelet; Future -     Basic metabolic panel; Future -     Urinalysis, Routine w reflex microscopic; Future -     Urinalysis, Routine w reflex microscopic -     Basic metabolic panel -     CBC with Differential/Platelet  Essential hypertension- Her blood pressure is adequately well controlled.  I cannot tell whether or not she is taking any antihypertensives.  I have asked her to be certain to stop taking indapamide.  She has a prerenal azotemia and an elevated H&H so I think she is dehydrated. -     CBC with Differential/Platelet; Future -     Basic metabolic panel; Future -     Urinalysis, Routine w reflex microscopic; Future -     Urinalysis, Routine w reflex microscopic -     Basic metabolic panel -     CBC with Differential/Platelet  Prediabetes- Her A1c is at 6.4%.  Her blood sugar is adequately well controlled. -     Hemoglobin A1c; Future -     Hemoglobin A1c  Generalized abdominal tenderness without rebound tenderness- Based on her symptoms, the abdominal exam, labs, and plain films I do not think she is having an acute  abdominal process.  I think she has an adjustment reaction with some somatic symptoms.  I recommended that  she work on stress reduction modalities and to try Klonopin as needed. -     CBC with Differential/Platelet; Future -     Urinalysis, Routine w reflex microscopic; Future -     Lipase; Future -     Amylase; Future -     DG ABD ACUTE 2+V W 1V CHEST; Future -     Amylase -     Lipase -     Urinalysis, Routine w reflex microscopic -     CBC with Differential/Platelet  Adjustment reaction with atypical features -     clonazePAM (KLONOPIN) 0.5 MG tablet; Take 1 tablet (0.5 mg total) by mouth 3 (three) times daily as needed for anxiety.   I have discontinued Allana G. Petre's indapamide and Relafen DS. I am also having her start on clonazePAM. Additionally, I am having her maintain her fluticasone furoate-vilanterol, albuterol, montelukast, gabapentin, loratadine, traMADol, cyclobenzaprine, rosuvastatin, omeprazole, and triamcinolone.  Meds ordered this encounter  Medications  . clonazePAM (KLONOPIN) 0.5 MG tablet    Sig: Take 1 tablet (0.5 mg total) by mouth 3 (three) times daily as needed for anxiety.    Dispense:  90 tablet    Refill:  1   I spent 60 minutes in preparing to see the patient by review of recent labs, imaging and procedures, obtaining and reviewing separately obtained history, communicating with the patient and family or caregiver, ordering medications, tests or procedures, and documenting clinical information in the EHR including the differential Dx, treatment, and any further evaluation and other management of 1. Essential hypertension 2. Stage 3b chronic kidney disease 3. Prediabetes 4. Generalized abdominal tenderness without rebound tenderness 5. Adjustment reaction with atypical features    Follow-up: Return in about 4 weeks (around 02/07/2020).  Scarlette Calico, MD

## 2020-01-31 ENCOUNTER — Ambulatory Visit: Payer: BC Managed Care – PPO | Admitting: Internal Medicine

## 2020-02-06 ENCOUNTER — Encounter: Payer: Self-pay | Admitting: Internal Medicine

## 2020-02-06 ENCOUNTER — Ambulatory Visit (INDEPENDENT_AMBULATORY_CARE_PROVIDER_SITE_OTHER): Payer: Medicare Other | Admitting: Internal Medicine

## 2020-02-06 ENCOUNTER — Other Ambulatory Visit: Payer: Self-pay

## 2020-02-06 VITALS — BP 136/76 | HR 88 | Temp 98.6°F | Resp 16 | Ht 62.0 in | Wt 142.0 lb

## 2020-02-06 DIAGNOSIS — I10 Essential (primary) hypertension: Secondary | ICD-10-CM | POA: Diagnosis not present

## 2020-02-06 DIAGNOSIS — J0100 Acute maxillary sinusitis, unspecified: Secondary | ICD-10-CM | POA: Diagnosis not present

## 2020-02-06 DIAGNOSIS — Z23 Encounter for immunization: Secondary | ICD-10-CM | POA: Diagnosis not present

## 2020-02-06 MED ORDER — AMOXICILLIN-POT CLAVULANATE 875-125 MG PO TABS: 1.0000 | ORAL_TABLET | Freq: Two times a day (BID) | ORAL | 0 refills | Status: AC

## 2020-02-06 NOTE — Progress Notes (Signed)
Subjective:  Patient ID: Julie Rivas, female    DOB: September 27, 1955  Age: 64 y.o. MRN: [id number]  CC: Hypertension and Sinusitis  This visit occurred during the SARS-CoV-2 public health emergency.  Safety protocols were in place, including screening questions prior to the visit, additional usage of staff PPE, and extensive cleaning of exam room while observing appropriate contact time as indicated for disinfecting solutions.    HPI [name] presents for a 10-day history of right-sided facial pain and nasal phlegm that is thick, yellow, green.  Outpatient Medications Prior to Visit  Medication Sig Dispense Refill  . albuterol (PROVENTIL) (2.5 MG/3ML) 0.083% nebulizer solution Take 3 mLs (2.5 mg total) by nebulization every 6 (six) hours as needed for wheezing or shortness of breath. 150 mL 2  . clonazePAM (KLONOPIN) 0.5 MG tablet Take 1 tablet (0.5 mg total) by mouth 3 (three) times daily as needed for anxiety. 90 tablet 1  . cyclobenzaprine (FLEXERIL) 10 MG tablet TAKE 1 TABLET UP TO THREE TIMES DAILY. 90 tablet 2  . fluticasone furoate-vilanterol (BREO ELLIPTA) 200-25 MCG/INH AEPB Inhale 1 puff into the lungs daily. 90 each 3  . gabapentin (NEURONTIN) 300 MG capsule TAKE 1 CAPSULE AT BEDTIME. 90 capsule 3  . loratadine (CLARITIN) 10 MG tablet Take 1 tablet (10 mg total) by mouth daily. 30 tablet 11  . montelukast (SINGULAIR) 10 MG tablet TAKE ONE TABLET AT BEDTIME. 90 tablet 1  . omeprazole (PRILOSEC) 40 MG capsule Take 1 capsule (40 mg total) by mouth daily. 30 capsule 1  . rosuvastatin (CRESTOR) 20 MG tablet Take 1 tablet (20 mg total) by mouth daily. 90 tablet 1  . traMADol (ULTRAM) 50 MG tablet Take 1 tablet (50 mg total) by mouth every 8 (eight) hours as needed. 90 tablet 3  . triamcinolone (NASACORT) 55 MCG/ACT AERO nasal inhaler Place 2 sprays into the nose daily. 1 Inhaler 12   No facility-administered medications prior to visit.    ROS Review of Systems    Constitutional: Negative.  Negative for chills, diaphoresis, fatigue and fever.  HENT: Positive for postnasal drip, rhinorrhea, sinus pressure and sinus pain. Negative for nosebleeds, sore throat and trouble swallowing.   Eyes: Negative.   Respiratory: Negative for cough, chest tightness, shortness of breath and wheezing.   Cardiovascular: Negative for chest pain, palpitations and leg swelling.  Gastrointestinal: Negative for abdominal pain, constipation, diarrhea, nausea and vomiting.  Endocrine: Negative.   Genitourinary: Negative.  Negative for difficulty urinating and dysuria.  Musculoskeletal: Negative.  Negative for arthralgias and myalgias.  Skin: Negative.  Negative for color change and pallor.  Neurological: Negative.  Negative for dizziness, weakness, light-headedness and headaches.  Hematological: Negative for adenopathy. Does not bruise/bleed easily.  Psychiatric/Behavioral: Negative.     Objective:  BP 136/76 (BP Location: Left Arm, Patient Position: Sitting, Cuff Size: Large)   Pulse 88   Temp 98.6 F (37 C) (Oral)   Resp 16   Ht 5\' 2"  (1.575 m)   Wt 142 lb (64.4 kg)   SpO2 96%   BMI 25.97 kg/m   BP Readings from Last 3 Encounters:  02/06/20 136/76  01/10/20 124/86  01/08/20 (!) 140/100    Wt Readings from Last 3 Encounters:  02/06/20 142 lb (64.4 kg)  01/10/20 139 lb (63 kg)  01/08/20 139 lb 12.8 oz (63.4 kg)    Physical Exam Vitals reviewed.  Constitutional:      Appearance: Normal appearance.  HENT:     Right  Ear: Tympanic membrane and ear canal normal.     Left Ear: Tympanic membrane and ear canal normal.     Nose: Nose normal. No nasal tenderness, mucosal edema, congestion or rhinorrhea.     Right Nostril: No epistaxis.     Left Nostril: No epistaxis.     Right Turbinates: Not enlarged, swollen or pale.     Left Turbinates: Not enlarged, swollen or pale.     Right Sinus: No maxillary sinus tenderness or frontal sinus tenderness.     Left Sinus:  No maxillary sinus tenderness.     Mouth/Throat:     Mouth: Mucous membranes are moist.  Eyes:     General: No scleral icterus.    Conjunctiva/sclera: Conjunctivae normal.  Cardiovascular:     Rate and Rhythm: Normal rate and regular rhythm.     Heart sounds: No murmur.  Pulmonary:     Effort: Pulmonary effort is normal.     Breath sounds: No stridor. No wheezing, rhonchi or rales.  Abdominal:     General: Abdomen is flat.     Palpations: There is no mass.     Tenderness: There is no abdominal tenderness. There is no guarding.  Musculoskeletal:        General: Normal range of motion.     Cervical back: Neck supple.     Right lower leg: No edema.     Left lower leg: No edema.  Lymphadenopathy:     Cervical: No cervical adenopathy.  Skin:    General: Skin is warm and dry.     Coloration: Skin is not pale.  Neurological:     General: No focal deficit present.     Mental Status: She is alert.     Lab Results  Component Value Date   WBC 9.2 01/10/2020   HGB 17.2 (H) 01/10/2020   HCT 50.6 (H) 01/10/2020   PLT 269.0 01/10/2020   GLUCOSE 195 (H) 01/10/2020   CHOL 290 (H) 07/25/2019   TRIG 149.0 07/25/2019   HDL 55.20 07/25/2019   LDLCALC 205 (H) 07/25/2019   ALT 16 01/08/2020   AST 20 01/08/2020   NA 135 01/10/2020   K 4.1 01/10/2020   CL 97 01/10/2020   CREATININE 1.37 (H) 01/10/2020   BUN 29 (H) 01/10/2020   CO2 29 01/10/2020   TSH 1.09 07/25/2019   PSA WNL 10/30/2015   INR 1.1 (H) 12/31/2015   HGBA1C 6.4 01/10/2020    MM 3D SCREEN BREAST BILATERAL  Result Date: 03/07/2019 CLINICAL DATA:  Screening. EXAM: DIGITAL SCREENING BILATERAL MAMMOGRAM WITH TOMO AND CAD COMPARISON:  Previous exam(s). ACR Breast Density Category b: There are scattered areas of fibroglandular density. FINDINGS: There are no findings suspicious for malignancy. Images were processed with CAD. IMPRESSION: No mammographic evidence of malignancy. A result letter of this screening mammogram will be  mailed directly to the patient. RECOMMENDATION: Screening mammogram in one year. (Code:SM-B-01Y) BI-RADS CATEGORY  1: Negative. Electronically Signed   By: Marin Olp M.D.   On: 03/07/2019 17:20    Assessment & Plan:   Larcenia was seen today for hypertension and sinusitis.  Diagnoses and all orders for this visit:  Essential hypertension- Her blood pressure is adequately well controlled.  Acute non-recurrent maxillary sinusitis- Will treat with a 10-day course of Augmentin. -     amoxicillin-clavulanate (AUGMENTIN) 875-125 MG tablet; Take 1 tablet by mouth 2 (two) times daily for 10 days.   I am having Axie G. Valladares start on amoxicillin-clavulanate.  I am also having her maintain her fluticasone furoate-vilanterol, albuterol, montelukast, gabapentin, loratadine, traMADol, cyclobenzaprine, rosuvastatin, omeprazole, triamcinolone, and clonazePAM.  Meds ordered this encounter  Medications  . amoxicillin-clavulanate (AUGMENTIN) 875-125 MG tablet    Sig: Take 1 tablet by mouth 2 (two) times daily for 10 days.    Dispense:  20 tablet    Refill:  0     Follow-up: Return if symptoms worsen or fail to improve.  Scarlette Calico, MD

## 2020-02-06 NOTE — Patient Instructions (Signed)
Sinusitis, Adult Sinusitis is inflammation of your sinuses. Sinuses are hollow spaces in the bones around your face. Your sinuses are located:  Around your eyes.  In the middle of your forehead.  Behind your nose.  In your cheekbones. Mucus normally drains out of your sinuses. When your nasal tissues become inflamed or swollen, mucus can become trapped or blocked. This allows bacteria, viruses, and fungi to grow, which leads to infection. Most infections of the sinuses are caused by a virus. Sinusitis can develop quickly. It can last for up to 4 weeks (acute) or for more than 12 weeks (chronic). Sinusitis often develops after a cold. What are the causes? This condition is caused by anything that creates swelling in the sinuses or stops mucus from draining. This includes:  Allergies.  Asthma.  Infection from bacteria or viruses.  Deformities or blockages in your nose or sinuses.  Abnormal growths in the nose (nasal polyps).  Pollutants, such as chemicals or irritants in the air.  Infection from fungi (rare). What increases the risk? You are more likely to develop this condition if you:  Have a weak body defense system (immune system).  Do a lot of swimming or diving.  Overuse nasal sprays.  Smoke. What are the signs or symptoms? The main symptoms of this condition are pain and a feeling of pressure around the affected sinuses. Other symptoms include:  Stuffy nose or congestion.  Thick drainage from your nose.  Swelling and warmth over the affected sinuses.  Headache.  Upper toothache.  A cough that may get worse at night.  Extra mucus that collects in the throat or the back of the nose (postnasal drip).  Decreased sense of smell and taste.  Fatigue.  A fever.  Sore throat.  Bad breath. How is this diagnosed? This condition is diagnosed based on:  Your symptoms.  Your medical history.  A physical exam.  Tests to find out if your condition is  acute or chronic. This may include: ? Checking your nose for nasal polyps. ? Viewing your sinuses using a device that has a light (endoscope). ? Testing for allergies or bacteria. ? Imaging tests, such as an MRI or CT scan. In rare cases, a bone biopsy may be done to rule out more serious types of fungal sinus disease. How is this treated? Treatment for sinusitis depends on the cause and whether your condition is chronic or acute.  If caused by a virus, your symptoms should go away on their own within 10 days. You may be given medicines to relieve symptoms. They include: ? Medicines that shrink swollen nasal passages (topical intranasal decongestants). ? Medicines that treat allergies (antihistamines). ? A spray that eases inflammation of the nostrils (topical intranasal corticosteroids). ? Rinses that help get rid of thick mucus in your nose (nasal saline washes).  If caused by bacteria, your health care provider may recommend waiting to see if your symptoms improve. Most bacterial infections will get better without antibiotic medicine. You may be given antibiotics if you have: ? A severe infection. ? A weak immune system.  If caused by narrow nasal passages or nasal polyps, you may need to have surgery. Follow these instructions at home: Medicines  Take, use, or apply over-the-counter and prescription medicines only as told by your health care provider. These may include nasal sprays.  If you were prescribed an antibiotic medicine, take it as told by your health care provider. Do not stop taking the antibiotic even if you start   to feel better. Hydrate and humidify   Drink enough fluid to keep your urine pale yellow. Staying hydrated will help to thin your mucus.  Use a cool mist humidifier to keep the humidity level in your home above 50%.  Inhale steam for 10-15 minutes, 3-4 times a day, or as told by your health care provider. You can do this in the bathroom while a hot shower is  running.  Limit your exposure to cool or dry air. Rest  Rest as much as possible.  Sleep with your head raised (elevated).  Make sure you get enough sleep each night. General instructions   Apply a warm, moist washcloth to your face 3-4 times a day or as told by your health care provider. This will help with discomfort.  Wash your hands often with soap and water to reduce your exposure to germs. If soap and water are not available, use hand sanitizer.  Do not smoke. Avoid being around people who are smoking (secondhand smoke).  Keep all follow-up visits as told by your health care provider. This is important. Contact a health care provider if:  You have a fever.  Your symptoms get worse.  Your symptoms do not improve within 10 days. Get help right away if:  You have a severe headache.  You have persistent vomiting.  You have severe pain or swelling around your face or eyes.  You have vision problems.  You develop confusion.  Your neck is stiff.  You have trouble breathing. Summary  Sinusitis is soreness and inflammation of your sinuses. Sinuses are hollow spaces in the bones around your face.  This condition is caused by nasal tissues that become inflamed or swollen. The swelling traps or blocks the flow of mucus. This allows bacteria, viruses, and fungi to grow, which leads to infection.  If you were prescribed an antibiotic medicine, take it as told by your health care provider. Do not stop taking the antibiotic even if you start to feel better.  Keep all follow-up visits as told by your health care provider. This is important. This information is not intended to replace advice given to you by your health care provider. Make sure you discuss any questions you have with your health care provider. Document Revised: 01/24/2018 Document Reviewed: 01/24/2018 Elsevier Patient Education  2020 [employer]  

## 2020-04-05 ENCOUNTER — Other Ambulatory Visit: Payer: Self-pay | Admitting: Internal Medicine

## 2020-04-05 ENCOUNTER — Telehealth: Payer: Self-pay | Admitting: Internal Medicine

## 2020-04-05 DIAGNOSIS — M5431 Sciatica, right side: Secondary | ICD-10-CM

## 2020-04-05 NOTE — Telephone Encounter (Signed)
   1.Medication Requested: cyclobenzaprine (FLEXERIL) 10 MG tablet gabapentin (NEURONTIN) 300 MG capsule  2. Pharmacy (Name, Street, [place]): [facility], [address]  3. On Med List: yes  4. Last Visit with PCP:  01/10/20  5. Next visit date with PCP: n/a   Agent: Please be advised that RX refills may take up to 3 business days. We ask that you follow-up with your pharmacy.

## 2020-04-06 ENCOUNTER — Other Ambulatory Visit: Payer: Self-pay | Admitting: Internal Medicine

## 2020-04-06 DIAGNOSIS — M5431 Sciatica, right side: Secondary | ICD-10-CM

## 2020-07-10 ENCOUNTER — Ambulatory Visit (INDEPENDENT_AMBULATORY_CARE_PROVIDER_SITE_OTHER): Payer: Medicare Other | Admitting: Internal Medicine

## 2020-07-10 ENCOUNTER — Other Ambulatory Visit: Payer: Self-pay

## 2020-07-10 ENCOUNTER — Encounter: Payer: Self-pay | Admitting: Internal Medicine

## 2020-07-10 VITALS — BP 144/98 | HR 87 | Temp 98.4°F | Resp 16 | Ht 62.0 in | Wt 142.0 lb

## 2020-07-10 DIAGNOSIS — N1832 Chronic kidney disease, stage 3b: Secondary | ICD-10-CM | POA: Diagnosis not present

## 2020-07-10 DIAGNOSIS — R7303 Prediabetes: Secondary | ICD-10-CM

## 2020-07-10 DIAGNOSIS — Z23 Encounter for immunization: Secondary | ICD-10-CM | POA: Diagnosis not present

## 2020-07-10 DIAGNOSIS — E785 Hyperlipidemia, unspecified: Secondary | ICD-10-CM

## 2020-07-10 DIAGNOSIS — I10 Essential (primary) hypertension: Secondary | ICD-10-CM | POA: Diagnosis not present

## 2020-07-10 LAB — HEPATIC FUNCTION PANEL
ALT: 13 U/L (ref 0–35)
AST: 16 U/L (ref 0–37)
Albumin: 4.3 g/dL (ref 3.5–5.2)
Alkaline Phosphatase: 91 U/L (ref 39–117)
Bilirubin, Direct: 0 mg/dL (ref 0.0–0.3)
Total Bilirubin: 0.6 mg/dL (ref 0.2–1.2)
Total Protein: 7.5 g/dL (ref 6.0–8.3)

## 2020-07-10 LAB — CBC WITH DIFFERENTIAL/PLATELET
Basophils Absolute: 0.1 10*3/uL (ref 0.0–0.1)
Basophils Relative: 0.8 % (ref 0.0–3.0)
Eosinophils Absolute: 0.1 10*3/uL (ref 0.0–0.7)
Eosinophils Relative: 1.3 % (ref 0.0–5.0)
HCT: 44 % (ref 36.0–46.0)
Hemoglobin: 15 g/dL (ref 12.0–15.0)
Lymphocytes Relative: 47.1 % — ABNORMAL HIGH (ref 12.0–46.0)
Lymphs Abs: 3 10*3/uL (ref 0.7–4.0)
MCHC: 34.1 g/dL (ref 30.0–36.0)
MCV: 93.7 fl (ref 78.0–100.0)
Monocytes Absolute: 0.4 10*3/uL (ref 0.1–1.0)
Monocytes Relative: 6.7 % (ref 3.0–12.0)
Neutro Abs: 2.8 10*3/uL (ref 1.4–7.7)
Neutrophils Relative %: 44.1 % (ref 43.0–77.0)
Platelets: 245 10*3/uL (ref 150.0–400.0)
RBC: 4.7 Mil/uL (ref 3.87–5.11)
RDW: 13.3 % (ref 11.5–15.5)
WBC: 6.4 10*3/uL (ref 4.0–10.5)

## 2020-07-10 LAB — BASIC METABOLIC PANEL
BUN: 15 mg/dL (ref 6–23)
CO2: 31 mEq/L (ref 19–32)
Calcium: 9.6 mg/dL (ref 8.4–10.5)
Chloride: 102 mEq/L (ref 96–112)
Creatinine, Ser: 0.76 mg/dL (ref 0.40–1.20)
GFR: 81.65 mL/min (ref >60.00)
Glucose, Bld: 100 mg/dL — ABNORMAL HIGH (ref 70–99)
Potassium: 3.6 mEq/L (ref 3.5–5.1)
Sodium: 140 mEq/L (ref 135–145)

## 2020-07-10 LAB — URINALYSIS, ROUTINE W REFLEX MICROSCOPIC
Ketones, ur: NEGATIVE
Leukocytes,Ua: NEGATIVE
Nitrite: NEGATIVE
Specific Gravity, Urine: 1.025 (ref 1.000–1.030)
Total Protein, Urine: NEGATIVE
Urine Glucose: NEGATIVE
Urobilinogen, UA: 0.2 (ref 0.0–1.0)
pH: 6 (ref 5.0–8.0)

## 2020-07-10 LAB — LIPID PANEL
Cholesterol: 268 mg/dL — ABNORMAL HIGH (ref 0–200)
HDL: 45.3 mg/dL (ref >39.00)
LDL Cholesterol: 207 mg/dL — ABNORMAL HIGH (ref 0–99)
NonHDL: 222.76
Total CHOL/HDL Ratio: 6
Triglycerides: 81 mg/dL (ref 0.0–149.0)
VLDL: 16.2 mg/dL (ref 0.0–40.0)

## 2020-07-10 LAB — HEMOGLOBIN A1C: Hgb A1c MFr Bld: 6.2 % (ref 4.6–6.5)

## 2020-07-10 LAB — TSH: TSH: 1.18 u[IU]/mL (ref 0.35–4.50)

## 2020-07-10 MED ORDER — INDAPAMIDE 1.25 MG PO TABS: 1.2500 mg | ORAL_TABLET | Freq: Every day | ORAL | 0 refills | Status: DC

## 2020-07-10 MED ORDER — ROSUVASTATIN CALCIUM 20 MG PO TABS: 20.0000 mg | ORAL_TABLET | Freq: Every day | ORAL | 1 refills | Status: DC

## 2020-07-10 MED ORDER — NEBIVOLOL HCL 5 MG PO TABS: 5.0000 mg | ORAL_TABLET | Freq: Every day | ORAL | 0 refills | Status: DC

## 2020-07-10 NOTE — Patient Instructions (Signed)

## 2020-07-10 NOTE — Progress Notes (Signed)
Subjective:  Patient ID: Julie Rivas, female    DOB: March 11, 1956  Age: 64 y.o. MRN: [id number]  CC: Hypertension  This visit occurred during the SARS-CoV-2 public health emergency.  Safety protocols were in place, including screening questions prior to the visit, additional usage of staff PPE, and extensive cleaning of exam room while observing appropriate contact time as indicated for disinfecting solutions.    HPI KALIFA CADDEN presents for f/up - She is concerned her blood pressure is not well controlled.  She is not currently taking any antihypertensives.  She has had a headache around her left eye and left frontal region for the last week.  She tells me that her blood pressure has been as high as 180/111 and the lowest she seen was 145/95.  She denies blurred vision, nausea, vomiting, chest pain, shortness of breath, palpitations, edema, or fatigue.  Outpatient Medications Prior to Visit  Medication Sig Dispense Refill  . albuterol (PROVENTIL) (2.5 MG/3ML) 0.083% nebulizer solution Take 3 mLs (2.5 mg total) by nebulization every 6 (six) hours as needed for wheezing or shortness of breath. 150 mL 2  . clonazePAM (KLONOPIN) 0.5 MG tablet Take 1 tablet (0.5 mg total) by mouth 3 (three) times daily as needed for anxiety. 90 tablet 1  . cyclobenzaprine (FLEXERIL) 10 MG tablet TAKE 1 TABLET BY MOUTH UP TO THREE TIMES DAILY 90 tablet 0  . fluticasone furoate-vilanterol (BREO ELLIPTA) 200-25 MCG/INH AEPB Inhale 1 puff into the lungs daily. 90 each 3  . gabapentin (NEURONTIN) 300 MG capsule Take 1 capsule by mouth at bedtime 90 capsule 0  . montelukast (SINGULAIR) 10 MG tablet TAKE ONE TABLET AT BEDTIME. 90 tablet 1  . omeprazole (PRILOSEC) 40 MG capsule Take 1 capsule (40 mg total) by mouth daily. 30 capsule 1  . traMADol (ULTRAM) 50 MG tablet Take 1 tablet (50 mg total) by mouth every 8 (eight) hours as needed. 90 tablet 3  . triamcinolone (NASACORT) 55 MCG/ACT AERO nasal inhaler Place 2  sprays into the nose daily. 1 Inhaler 12  . rosuvastatin (CRESTOR) 20 MG tablet Take 1 tablet (20 mg total) by mouth daily. 90 tablet 1  . loratadine (CLARITIN) 10 MG tablet Take 1 tablet (10 mg total) by mouth daily. 30 tablet 11   No facility-administered medications prior to visit.    ROS Review of Systems  Constitutional: Negative for appetite change, diaphoresis, fatigue and unexpected weight change.  HENT: Negative.   Eyes: Negative for pain and visual disturbance.  Respiratory: Negative for cough, chest tightness, shortness of breath and wheezing.   Cardiovascular: Negative for chest pain, palpitations and leg swelling.  Gastrointestinal: Negative for abdominal pain, constipation, diarrhea, nausea and vomiting.  Genitourinary: Negative.  Negative for difficulty urinating.  Musculoskeletal: Negative for arthralgias and myalgias.  Skin: Negative.   Neurological: Positive for headaches. Negative for dizziness, weakness and numbness.  Hematological: Negative for adenopathy. Does not bruise/bleed easily.  Psychiatric/Behavioral: Negative.     Objective:  BP (!) 144/98 (BP Location: Left Arm, Patient Position: Sitting, Cuff Size: Large)   Pulse 87   Temp 98.4 F (36.9 C) (Oral)   Resp 16   Ht 5\' 2"  (1.575 m)   Wt 142 lb (64.4 kg)   SpO2 97%   BMI 25.97 kg/m   BP Readings from Last 3 Encounters:  07/10/20 (!) 144/98  02/06/20 136/76  01/10/20 124/86    Wt Readings from Last 3 Encounters:  07/10/20 142 lb (64.4 kg)  02/06/20  142 lb (64.4 kg)  01/10/20 139 lb (63 kg)    Physical Exam Vitals reviewed.  Constitutional:      Appearance: Normal appearance.  HENT:     Nose: Nose normal.     Mouth/Throat:     Mouth: Mucous membranes are moist.  Eyes:     General: No scleral icterus.    Extraocular Movements:     Right eye: Normal extraocular motion and no nystagmus.     Left eye: Normal extraocular motion and no nystagmus.     Conjunctiva/sclera: Conjunctivae  normal.  Cardiovascular:     Rate and Rhythm: Normal rate and regular rhythm.     Heart sounds: No murmur heard.   Pulmonary:     Effort: Pulmonary effort is normal.     Breath sounds: No stridor. No wheezing, rhonchi or rales.  Abdominal:     General: Abdomen is flat.     Palpations: There is no mass.     Tenderness: There is no abdominal tenderness. There is no guarding.  Musculoskeletal:        General: Normal range of motion.     Cervical back: Neck supple.     Right lower leg: No edema.     Left lower leg: No edema.  Lymphadenopathy:     Cervical: No cervical adenopathy.  Skin:    General: Skin is warm and dry.  Neurological:     General: No focal deficit present.     Mental Status: She is alert and oriented to person, place, and time. Mental status is at baseline.     Cranial Nerves: Cranial nerves are intact.     Sensory: Sensation is intact.     Motor: Motor function is intact. No weakness.     Coordination: Coordination is intact.  Psychiatric:        Mood and Affect: Mood normal.        Behavior: Behavior normal.     Lab Results  Component Value Date   WBC 6.4 07/10/2020   HGB 15.0 07/10/2020   HCT 44.0 07/10/2020   PLT 245.0 07/10/2020   GLUCOSE 100 (H) 07/10/2020   CHOL 268 (H) 07/10/2020   TRIG 81.0 07/10/2020   HDL 45.30 07/10/2020   LDLCALC 207 (H) 07/10/2020   ALT 13 07/10/2020   AST 16 07/10/2020   NA 140 07/10/2020   K 3.6 07/10/2020   CL 102 07/10/2020   CREATININE 0.76 07/10/2020   BUN 15 07/10/2020   CO2 31 07/10/2020   TSH 1.18 07/10/2020   PSA WNL 10/30/2015   INR 1.1 (H) 12/31/2015   HGBA1C 6.2 07/10/2020    MM 3D SCREEN BREAST BILATERAL  Result Date: 03/07/2019 CLINICAL DATA:  Screening. EXAM: DIGITAL SCREENING BILATERAL MAMMOGRAM WITH TOMO AND CAD COMPARISON:  Previous exam(s). ACR Breast Density Category b: There are scattered areas of fibroglandular density. FINDINGS: There are no findings suspicious for malignancy. Images were  processed with CAD. IMPRESSION: No mammographic evidence of malignancy. A result letter of this screening mammogram will be mailed directly to the patient. RECOMMENDATION: Screening mammogram in one year. (Code:SM-B-01Y) BI-RADS CATEGORY  1: Negative. Electronically Signed   By: Marin Olp M.D.   On: 03/07/2019 17:20    Assessment & Plan:   Zenith was seen today for hypertension.  Diagnoses and all orders for this visit:  Essential hypertension- Her blood pressure is not adequately well controlled and she is symptomatic.  Her labs are negative for secondary causes or endorgan damage.  Will treat the hypertension with nebivolol and indapamide. -     CBC with Differential/Platelet; Future -     TSH; Future -     Urinalysis, Routine w reflex microscopic; Future -     nebivolol (BYSTOLIC) 5 MG tablet; Take 1 tablet (5 mg total) by mouth daily. -     Urinalysis, Routine w reflex microscopic -     TSH -     CBC with Differential/Platelet -     indapamide (LOZOL) 1.25 MG tablet; Take 1 tablet (1.25 mg total) by mouth daily.  Stage 3b chronic kidney disease ([place])- Her renal function has improved.  She will avoid nephrotoxic agents.  Will take measures to get better control of her blood pressure. -     CBC with Differential/Platelet; Future -     Basic metabolic panel; Future -     Urinalysis, Routine w reflex microscopic; Future -     Urinalysis, Routine w reflex microscopic -     Basic metabolic panel -     CBC with Differential/Platelet  Hyperlipidemia with target LDL less than 130- I recommended that she take a statin for cardiovascular risk reduction. -     Lipid panel; Future -     Hepatic function panel; Future -     TSH; Future -     TSH -     Hepatic function panel -     Lipid panel -     rosuvastatin (CRESTOR) 20 MG tablet; Take 1 tablet (20 mg total) by mouth daily.  Prediabetes- Her A1c is at 6.2%.  Medical therapy is not yet indicated. -     Hemoglobin A1c; Future -      Hemoglobin A1c  Flu vaccine need -     Flu Vaccine QUAD High Dose(Fluad)   I have discontinued Chelesea G. Dayhoff's loratadine. I am also having her start on nebivolol and indapamide. Additionally, I am having her maintain her fluticasone furoate-vilanterol, albuterol, montelukast, traMADol, omeprazole, triamcinolone, clonazePAM, cyclobenzaprine, gabapentin, and rosuvastatin.  Meds ordered this encounter  Medications  . nebivolol (BYSTOLIC) 5 MG tablet    Sig: Take 1 tablet (5 mg total) by mouth daily.    Dispense:  90 tablet    Refill:  0  . rosuvastatin (CRESTOR) 20 MG tablet    Sig: Take 1 tablet (20 mg total) by mouth daily.    Dispense:  90 tablet    Refill:  1  . indapamide (LOZOL) 1.25 MG tablet    Sig: Take 1 tablet (1.25 mg total) by mouth daily.    Dispense:  90 tablet    Refill:  0     Follow-up: Return in about 4 weeks (around 08/07/2020).  Scarlette Calico, MD

## 2020-07-16 ENCOUNTER — Other Ambulatory Visit: Payer: Self-pay | Admitting: Internal Medicine

## 2020-07-16 DIAGNOSIS — Z1231 Encounter for screening mammogram for malignant neoplasm of breast: Secondary | ICD-10-CM

## 2020-07-22 ENCOUNTER — Other Ambulatory Visit: Payer: Self-pay | Admitting: Internal Medicine

## 2020-07-30 ENCOUNTER — Other Ambulatory Visit: Payer: Self-pay

## 2020-07-30 ENCOUNTER — Ambulatory Visit (INDEPENDENT_AMBULATORY_CARE_PROVIDER_SITE_OTHER): Payer: Medicare Other | Admitting: Family

## 2020-07-30 ENCOUNTER — Encounter: Payer: Self-pay | Admitting: Family

## 2020-07-30 VITALS — BP 136/80 | HR 85 | Temp 97.7°F | Ht 62.0 in | Wt 144.0 lb

## 2020-07-30 DIAGNOSIS — M542 Cervicalgia: Secondary | ICD-10-CM | POA: Diagnosis not present

## 2020-07-30 DIAGNOSIS — I10 Essential (primary) hypertension: Secondary | ICD-10-CM

## 2020-07-30 DIAGNOSIS — G44209 Tension-type headache, unspecified, not intractable: Secondary | ICD-10-CM

## 2020-07-30 MED ORDER — AMOXICILLIN-POT CLAVULANATE 875-125 MG PO TABS: 1.0000 | ORAL_TABLET | Freq: Two times a day (BID) | ORAL | 0 refills | Status: AC

## 2020-07-30 MED ORDER — KETOROLAC TROMETHAMINE 30 MG/ML IJ SOLN
30.0000 mg | Freq: Once | INTRAMUSCULAR | Status: AC
  Administered 2020-07-30: 30 mg via INTRAMUSCULAR

## 2020-07-30 NOTE — Patient Instructions (Signed)
I think your headache is a combination of a tension headache and sinus infection. Please schedule a follow-up with Dr. Ronnald Ramp in 2-3 weeks for re-check.

## 2020-07-30 NOTE — Progress Notes (Signed)
Julie Rivas is a 64 y.o. female with the following history as recorded in Epic[facility]:  Patient Active Problem List   Diagnosis Date Noted  . Stage 3b chronic kidney disease ([place]) 01/10/2020  . Laryngitis, chronic 07/25/2019  . Callous ulcer, limited to breakdown of skin ([place]) 04/27/2019  . Essential hypertension 09/21/2018  . Primary osteoarthritis of both shoulders 07/04/2018  . Prediabetes 02/18/2017  . Carpal tunnel syndrome on both sides 12/03/2016  . Hyperlipidemia with target LDL less than 130 10/31/2015  . Allergic rhinitis due to pollen 10/30/2015  . Asthma, moderate persistent 10/30/2015  . Routine general medical examination at a health care facility 10/30/2015  . Sciatica of right side 10/30/2015    Current Outpatient Medications  Medication Sig Dispense Refill  . albuterol (PROVENTIL) (2.5 MG/3ML) 0.083% nebulizer solution Take 3 mLs (2.5 mg total) by nebulization every 6 (six) hours as needed for wheezing or shortness of breath. 150 mL 2  . clonazePAM (KLONOPIN) 0.5 MG tablet Take 1 tablet (0.5 mg total) by mouth 3 (three) times daily as needed for anxiety. 90 tablet 1  . cyclobenzaprine (FLEXERIL) 10 MG tablet TAKE 1 TABLET BY MOUTH UP TO THREE TIMES DAILY 90 tablet 0  . fluticasone furoate-vilanterol (BREO ELLIPTA) 200-25 MCG/INH AEPB Inhale 1 puff into the lungs daily. 90 each 3  . gabapentin (NEURONTIN) 300 MG capsule Take 1 capsule by mouth at bedtime 90 capsule 0  . indapamide (LOZOL) 1.25 MG tablet Take 1 tablet (1.25 mg total) by mouth daily. 90 tablet 0  . indomethacin (INDOCIN) 50 MG capsule TAKE 1 CAPSULE BY MOUTH THREE TIMES DAILY WITH MEALS 90 capsule 0  . montelukast (SINGULAIR) 10 MG tablet TAKE ONE TABLET AT BEDTIME. 90 tablet 1  . nebivolol (BYSTOLIC) 5 MG tablet Take 1 tablet (5 mg total) by mouth daily. 90 tablet 0  . omeprazole (PRILOSEC) 40 MG capsule Take 1 capsule (40 mg total) by mouth daily. 30 capsule 1  . rosuvastatin (CRESTOR) 20 MG tablet  Take 1 tablet (20 mg total) by mouth daily. 90 tablet 1  . traMADol (ULTRAM) 50 MG tablet Take 1 tablet (50 mg total) by mouth every 8 (eight) hours as needed. 90 tablet 3  . triamcinolone (NASACORT) 55 MCG/ACT AERO nasal inhaler Place 2 sprays into the nose daily. 1 Inhaler 12  . amoxicillin-clavulanate (AUGMENTIN) 875-125 MG tablet Take 1 tablet by mouth 2 (two) times daily for 10 days. 20 tablet 0   No current facility-administered medications for this visit.    Allergies: Codeine  Past Medical History:  Diagnosis Date  . Arthritis   . Asthma     Past Surgical History:  Procedure Laterality Date  . ECTOPIC PREGNANCY SURGERY     has partial right fallopian tube    Family History  Problem Relation Age of Onset  . Heart failure Mother   . Arthritis Mother   . Cancer Father   . Hypertension Father   . Diabetes Sister   . Colon cancer Neg Hx   . Esophageal cancer Neg Hx   . Stomach cancer Neg Hx   . Rectal cancer Neg Hx   . Hyperlipidemia Neg Hx   . Heart disease Neg Hx   . Early death Neg Hx   . Depression Neg Hx     Social History   Tobacco Use  . Smoking status: Never Smoker  . Smokeless tobacco: Never Used  Substance Use Topics  . Alcohol use: No    Subjective:  Patient notes that she started with a headache on Saturday. States that she tripped while walking down the stairs at church on Sunday because her left eye "got blurry." She notes that the vision in her left eye is always blurry due to underlying glaucoma but this seemed to be worse. She did not fall at church and was able to go home with no further issues. Yesterday, she "got worked up" due to a personal issue and notes her blood pressure got very high. She just took her medication and was able to rest and lie down. She notes her chest felt tight yesterday but she assumed it was due to the stress- no chest pain or shortness of breath today; no chest pain on exertion; does mention that her left ear and left sinus  are hurting today/ feels the pain in her neck is radiating up into the back of her neck and has some pain when moving her neck especially to the left;    Objective:  Vitals:   07/30/20 1052  BP: 136/80  Pulse: 85  Temp: 97.7 F (36.5 C)  TempSrc: Oral  SpO2: 98%  Weight: 144 lb (65.3 kg)  Height: 5\' 2"  (1.575 m)    General: Well developed, well nourished, in no acute distress  Skin : Warm and dry.  Head: Normocephalic and atraumatic  Eyes: Sclera and conjunctiva clear; pupils round and reactive to light; extraocular movements intact  Ears: External normal; canals clear; tympanic membranes normal  Oropharynx: Pink, supple. No suspicious lesions  Neck: Supple without thyromegaly, adenopathy  Lungs: Respirations unlabored; clear to auscultation bilaterally without wheeze, rales, rhonchi  CVS exam: normal rate and regular rhythm.  Abdomen: Soft; nontender; nondistended; normoactive bowel sounds; no masses or hepatosplenomegaly  Musculoskeletal: No deformities; no active joint inflammation  Extremities: No edema, cyanosis, clubbing  Vessels: Symmetric bilaterally  Neurologic: Alert and oriented; speech intact; face symmetrical; moves all extremities well; CNII-XII intact without focal deficit   Assessment:  1. Tension headache   2. Neck pain   3. Essential hypertension     Plan:  Patient becomes tearful in office when discussing personal matters- is going through a difficult separation after being married for 40+ years; EKG shows NSR; blood pressure appears well controlled today; am suspicious that majority of symptoms are stress related; will treat for tension type headache today with Toradol IM 30 mg; she is encouraged to use her Flexeril to help with her neck pain/ suspected muscle spasm; will treat for possible sinus infection with 10 day course of Augmentin; if headache persists, will need to consider imaging but low suspicion for TIA at this time; Plan to see her PCP for re-check  in 2-3 weeks;  This visit occurred during the SARS-CoV-2 public health emergency.  Safety protocols were in place, including screening questions prior to the visit, additional usage of staff PPE, and extensive cleaning of exam room while observing appropriate contact time as indicated for disinfecting solutions.     No follow-ups on file.  Orders Placed This Encounter  Procedures  . EKG 12-Lead    Requested Prescriptions   Signed Prescriptions Disp Refills  . amoxicillin-clavulanate (AUGMENTIN) 875-125 MG tablet 20 tablet 0    Sig: Take 1 tablet by mouth 2 (two) times daily for 10 days.

## 2020-08-19 DIAGNOSIS — Z20822 Contact with and (suspected) exposure to covid-19: Secondary | ICD-10-CM | POA: Diagnosis not present

## 2020-08-27 ENCOUNTER — Ambulatory Visit
Admission: RE | Admit: 2020-08-27 | Discharge: 2020-08-27 | Disposition: A | Discharge status: Home / Self Care | Payer: Medicare Other | Source: Ambulatory Visit | Attending: Internal Medicine | Admitting: Internal Medicine

## 2020-08-27 ENCOUNTER — Other Ambulatory Visit: Payer: Self-pay

## 2020-08-27 DIAGNOSIS — Z1231 Encounter for screening mammogram for malignant neoplasm of breast: Secondary | ICD-10-CM | POA: Diagnosis not present

## 2020-08-28 ENCOUNTER — Telehealth: Payer: Self-pay | Admitting: Internal Medicine

## 2020-08-28 NOTE — Progress Notes (Signed)
  Chronic Care Management   Outreach Note  08/28/2020 Name: Julie Rivas MRN: [id number] DOB: Nov 17, 1955  Referred by: Janith Lima, MD Reason for referral : No chief complaint on file.   An unsuccessful telephone outreach was attempted today. The patient was referred to the pharmacist for assistance with care management and care coordination.   Follow Up Plan:   Carley Perdue [employer] Scheduler

## 2020-08-29 LAB — HM MAMMOGRAPHY

## 2020-09-09 ENCOUNTER — Ambulatory Visit: Payer: BC Managed Care – PPO | Admitting: Internal Medicine

## 2020-09-12 ENCOUNTER — Encounter: Payer: Self-pay | Admitting: Internal Medicine

## 2020-09-12 ENCOUNTER — Other Ambulatory Visit: Payer: Self-pay

## 2020-09-12 ENCOUNTER — Ambulatory Visit (INDEPENDENT_AMBULATORY_CARE_PROVIDER_SITE_OTHER): Payer: Medicare Other | Admitting: Internal Medicine

## 2020-09-12 VITALS — BP 126/76 | HR 85 | Temp 98.9°F | Ht 62.0 in | Wt 145.0 lb

## 2020-09-12 DIAGNOSIS — T502X5A Adverse effect of carbonic-anhydrase inhibitors, benzothiadiazides and other diuretics, initial encounter: Secondary | ICD-10-CM | POA: Diagnosis not present

## 2020-09-12 DIAGNOSIS — I10 Essential (primary) hypertension: Secondary | ICD-10-CM | POA: Diagnosis not present

## 2020-09-12 DIAGNOSIS — R202 Paresthesia of skin: Secondary | ICD-10-CM | POA: Diagnosis not present

## 2020-09-12 DIAGNOSIS — G63 Polyneuropathy in diseases classified elsewhere: Secondary | ICD-10-CM

## 2020-09-12 DIAGNOSIS — N1832 Chronic kidney disease, stage 3b: Secondary | ICD-10-CM

## 2020-09-12 DIAGNOSIS — F411 Generalized anxiety disorder: Secondary | ICD-10-CM | POA: Diagnosis not present

## 2020-09-12 DIAGNOSIS — E538 Deficiency of other specified B group vitamins: Secondary | ICD-10-CM

## 2020-09-12 DIAGNOSIS — E876 Hypokalemia: Secondary | ICD-10-CM | POA: Insufficient documentation

## 2020-09-12 LAB — BASIC METABOLIC PANEL
BUN: 13 mg/dL (ref 6–23)
CO2: 34 mEq/L — ABNORMAL HIGH (ref 19–32)
Calcium: 9.6 mg/dL (ref 8.4–10.5)
Chloride: 99 mEq/L (ref 96–112)
Creatinine, Ser: 0.77 mg/dL (ref 0.40–1.20)
GFR: 80.28 mL/min (ref >60.00)
Glucose, Bld: 91 mg/dL (ref 70–99)
Potassium: 3.1 mEq/L — ABNORMAL LOW (ref 3.5–5.1)
Sodium: 140 mEq/L (ref 135–145)

## 2020-09-12 LAB — CBC WITH DIFFERENTIAL/PLATELET
Basophils Absolute: 0.1 10*3/uL (ref 0.0–0.1)
Basophils Relative: 0.8 % (ref 0.0–3.0)
Eosinophils Absolute: 0.1 10*3/uL (ref 0.0–0.7)
Eosinophils Relative: 1.8 % (ref 0.0–5.0)
HCT: 40.3 % (ref 36.0–46.0)
Hemoglobin: 14 g/dL (ref 12.0–15.0)
Lymphocytes Relative: 35.8 % (ref 12.0–46.0)
Lymphs Abs: 2.4 10*3/uL (ref 0.7–4.0)
MCHC: 34.7 g/dL (ref 30.0–36.0)
MCV: 92.2 fl (ref 78.0–100.0)
Monocytes Absolute: 0.5 10*3/uL (ref 0.1–1.0)
Monocytes Relative: 6.9 % (ref 3.0–12.0)
Neutro Abs: 3.7 10*3/uL (ref 1.4–7.7)
Neutrophils Relative %: 54.7 % (ref 43.0–77.0)
Platelets: 218 10*3/uL (ref 150.0–400.0)
RBC: 4.37 Mil/uL (ref 3.87–5.11)
RDW: 13.3 % (ref 11.5–15.5)
WBC: 6.7 10*3/uL (ref 4.0–10.5)

## 2020-09-12 LAB — VITAMIN B12: Vitamin B-12: 172 pg/mL — ABNORMAL LOW (ref 211–911)

## 2020-09-12 LAB — FOLATE: Folate: 12.5 ng/mL (ref >5.9)

## 2020-09-12 MED ORDER — POTASSIUM CHLORIDE CRYS ER 20 MEQ PO TBCR: 20.0000 meq | EXTENDED_RELEASE_TABLET | Freq: Two times a day (BID) | ORAL | 0 refills | Status: DC

## 2020-09-12 MED ORDER — CLONAZEPAM 0.5 MG PO TABS: 0.5000 mg | ORAL_TABLET | Freq: Three times a day (TID) | ORAL | 1 refills | Status: DC | PRN

## 2020-09-12 NOTE — Patient Instructions (Signed)

## 2020-09-12 NOTE — Progress Notes (Signed)
Subjective:  Patient ID: Julie Rivas, female    DOB: Oct 09, 1955  Age: 65 y.o. MRN: [id number]  CC: Hypertension and Osteoarthritis  This visit occurred during the SARS-CoV-2 public health emergency.  Safety protocols were in place, including screening questions prior to the visit, additional usage of staff PPE, and extensive cleaning of exam room while observing appropriate contact time as indicated for disinfecting solutions.    HPI Julie Rivas presents for f/up - She complains of a 2-week history of numbness and tingling, symmetrically, in her upper and lower extremities.  She denies neck pain, back pain, headache, blurred vision, slurred speech, or ataxia.  Outpatient Medications Prior to Visit  Medication Sig Dispense Refill   cyclobenzaprine (FLEXERIL) 10 MG tablet TAKE 1 TABLET BY MOUTH UP TO THREE TIMES DAILY 90 tablet 0   gabapentin (NEURONTIN) 300 MG capsule Take 1 capsule by mouth at bedtime 90 capsule 0   indomethacin (INDOCIN) 50 MG capsule TAKE 1 CAPSULE BY MOUTH THREE TIMES DAILY WITH MEALS 90 capsule 0   nebivolol (BYSTOLIC) 5 MG tablet Take 1 tablet (5 mg total) by mouth daily. 90 tablet 0   rosuvastatin (CRESTOR) 20 MG tablet Take 1 tablet (20 mg total) by mouth daily. 90 tablet 1   albuterol (PROVENTIL) (2.5 MG/3ML) 0.083% nebulizer solution Take 3 mLs (2.5 mg total) by nebulization every 6 (six) hours as needed for wheezing or shortness of breath. 150 mL 2   clonazePAM (KLONOPIN) 0.5 MG tablet Take 1 tablet (0.5 mg total) by mouth 3 (three) times daily as needed for anxiety. 90 tablet 1   fluticasone furoate-vilanterol (BREO ELLIPTA) 200-25 MCG/INH AEPB Inhale 1 puff into the lungs daily. 90 each 3   indapamide (LOZOL) 1.25 MG tablet Take 1 tablet (1.25 mg total) by mouth daily. 90 tablet 0   montelukast (SINGULAIR) 10 MG tablet TAKE ONE TABLET AT BEDTIME. 90 tablet 1   omeprazole (PRILOSEC) 40 MG capsule Take 1 capsule (40 mg total) by mouth daily. 30  capsule 1   traMADol (ULTRAM) 50 MG tablet Take 1 tablet (50 mg total) by mouth every 8 (eight) hours as needed. 90 tablet 3   triamcinolone (NASACORT) 55 MCG/ACT AERO nasal inhaler Place 2 sprays into the nose daily. 1 Inhaler 12   No facility-administered medications prior to visit.    ROS Review of Systems  Constitutional: Negative for appetite change, diaphoresis, fatigue and unexpected weight change.  HENT: Negative.   Eyes: Negative for visual disturbance.  Respiratory: Negative for cough, chest tightness, shortness of breath and wheezing.   Cardiovascular: Negative for chest pain, palpitations and leg swelling.  Gastrointestinal: Negative for abdominal pain, constipation, diarrhea, nausea and vomiting.  Endocrine: Negative.   Genitourinary: Negative.  Negative for difficulty urinating.  Musculoskeletal: Positive for arthralgias. Negative for back pain and myalgias.  Skin: Negative for color change, pallor and rash.  Neurological: Positive for numbness. Negative for dizziness, weakness and light-headedness.  Hematological: Negative for adenopathy. Does not bruise/bleed easily.  Psychiatric/Behavioral: Positive for dysphoric mood. Negative for sleep disturbance and suicidal ideas. The patient is nervous/anxious.     Objective:  BP 126/76    Pulse 85    Temp 98.9 F (37.2 C) (Oral)    Ht 5\' 2"  (1.575 m)    Wt 145 lb (65.8 kg)    SpO2 98%    BMI 26.52 kg/m   BP Readings from Last 3 Encounters:  09/12/20 126/76  07/30/20 136/80  07/10/20 (!) 144/98  Wt Readings from Last 3 Encounters:  09/12/20 145 lb (65.8 kg)  07/30/20 144 lb (65.3 kg)  07/10/20 142 lb (64.4 kg)    Physical Exam Vitals reviewed.  HENT:     Nose: Nose normal.     Mouth/Throat:     Mouth: Mucous membranes are moist.  Eyes:     General: No scleral icterus.    Conjunctiva/sclera: Conjunctivae normal.  Cardiovascular:     Rate and Rhythm: Normal rate and regular rhythm.     Heart sounds: No  murmur heard.   Pulmonary:     Effort: Pulmonary effort is normal.     Breath sounds: No stridor. No wheezing, rhonchi or rales.  Abdominal:     General: Abdomen is flat. Bowel sounds are normal. There is no distension.     Palpations: Abdomen is soft. There is no hepatomegaly, splenomegaly or mass.     Tenderness: There is no abdominal tenderness.  Musculoskeletal:        General: Normal range of motion.     Cervical back: Neck supple.     Right lower leg: No edema.     Left lower leg: No edema.  Lymphadenopathy:     Cervical: No cervical adenopathy.  Skin:    General: Skin is warm and dry.     Findings: No rash.  Neurological:     General: No focal deficit present.     Mental Status: She is alert and oriented to person, place, and time. Mental status is at baseline.  Psychiatric:        Attention and Perception: Attention normal. She is attentive.        Mood and Affect: Mood is anxious.        Behavior: Behavior normal.        Thought Content: Thought content normal. Thought content is not paranoid or delusional. Thought content does not include homicidal or suicidal ideation.        Cognition and Memory: Cognition normal.     Lab Results  Component Value Date   WBC 6.7 09/12/2020   HGB 14.0 09/12/2020   HCT 40.3 09/12/2020   PLT 218.0 09/12/2020   GLUCOSE 91 09/12/2020   CHOL 268 (H) 07/10/2020   TRIG 81.0 07/10/2020   HDL 45.30 07/10/2020   LDLCALC 207 (H) 07/10/2020   ALT 13 07/10/2020   AST 16 07/10/2020   NA 140 09/12/2020   K 3.1 (L) 09/12/2020   CL 99 09/12/2020   CREATININE 0.77 09/12/2020   BUN 13 09/12/2020   CO2 34 (H) 09/12/2020   TSH 1.18 07/10/2020   PSA WNL 10/30/2015   INR 1.1 (H) 12/31/2015   HGBA1C 6.2 07/10/2020    MM 3D SCREEN BREAST BILATERAL  Result Date: 08/29/2020 CLINICAL DATA:  Screening. EXAM: DIGITAL SCREENING BILATERAL MAMMOGRAM WITH TOMO AND CAD COMPARISON:  Previous exam(s). ACR Breast Density Category c: The breast tissue  is heterogeneously dense, which may obscure small masses. FINDINGS: There are no findings suspicious for malignancy. Images were processed with CAD. IMPRESSION: No mammographic evidence of malignancy. A result letter of this screening mammogram will be mailed directly to the patient. RECOMMENDATION: Screening mammogram in one year. (Code:SM-B-01Y) BI-RADS CATEGORY  1: Negative. Electronically Signed   By: Annia Belt M.D.   On: 08/29/2020 12:07    Assessment & Plan:   Chayanne was seen today for hypertension and osteoarthritis.  Diagnoses and all orders for this visit:  Tingling in extremities- Labs are remarkable for B12  deficiency.  I think this is causing her symptoms. -     CBC with Differential/Platelet; Future -     Vitamin B12; Future -     Folate; Future -     Folate -     Vitamin B12 -     CBC with Differential/Platelet  Essential hypertension- Her blood pressure is well controlled but she has developed hypokalemia.  Well start a potassium supplement. -     CBC with Differential/Platelet; Future -     CBC with Differential/Platelet -     potassium chloride SA (KLOR-CON) 20 MEQ tablet; Take 1 tablet (20 mEq total) by mouth 2 (two) times daily.  Stage 3b chronic kidney disease ([place])- Her blood pressure is adequately well controlled.  She will avoid nephrotoxic agents. -     CBC with Differential/Platelet; Future -     Basic metabolic panel; Future -     Basic metabolic panel -     CBC with Differential/Platelet  GAD (generalized anxiety disorder) -     clonazePAM (KLONOPIN) 0.5 MG tablet; Take 1 tablet (0.5 mg total) by mouth 3 (three) times daily as needed for anxiety.  Diuretic-induced hypokalemia -     potassium chloride SA (KLOR-CON) 20 MEQ tablet; Take 1 tablet (20 mEq total) by mouth 2 (two) times daily.  Vitamin B12 deficiency neuropathy ([place])- I recommended that she receive parenteral B12 replacement therapy, weekly for 3 weeks, then monthly thereafter.   I have  discontinued Reata G. Hand's fluticasone furoate-vilanterol, albuterol, montelukast, traMADol, omeprazole, triamcinolone, and indapamide. I am also having her start on potassium chloride SA. Additionally, I am having her maintain her cyclobenzaprine, gabapentin, nebivolol, rosuvastatin, indomethacin, and clonazePAM.  Meds ordered this encounter  Medications   clonazePAM (KLONOPIN) 0.5 MG tablet    Sig: Take 1 tablet (0.5 mg total) by mouth 3 (three) times daily as needed for anxiety.    Dispense:  270 tablet    Refill:  1   potassium chloride SA (KLOR-CON) 20 MEQ tablet    Sig: Take 1 tablet (20 mEq total) by mouth 2 (two) times daily.    Dispense:  180 tablet    Refill:  0     Follow-up: Return in about 3 months (around 12/11/2020).  Scarlette Calico, MD

## 2020-09-13 ENCOUNTER — Ambulatory Visit (INDEPENDENT_AMBULATORY_CARE_PROVIDER_SITE_OTHER): Payer: Medicare Other

## 2020-09-13 DIAGNOSIS — E538 Deficiency of other specified B group vitamins: Secondary | ICD-10-CM | POA: Diagnosis not present

## 2020-09-13 DIAGNOSIS — G63 Polyneuropathy in diseases classified elsewhere: Secondary | ICD-10-CM

## 2020-09-13 MED ORDER — CYANOCOBALAMIN 1000 MCG/ML IJ SOLN
1000.0000 ug | INTRAMUSCULAR | Status: AC
  Administered 2020-09-13 – 2020-09-30 (×3): 1000 ug via INTRAMUSCULAR

## 2020-09-13 NOTE — Progress Notes (Signed)
Pt here for weekly B12 injection #1 of 3 per Dr Ronnald Ramp.  B12 1091mcg given IM right deltoid and pt tolerated injection well.  Next B12 injection scheduled  09/20/20.  Dr Quay Burow: Can you cosign since PCP is out of office?

## 2020-09-17 ENCOUNTER — Ambulatory Visit: Payer: Medicare Other | Admitting: Internal Medicine

## 2020-09-20 ENCOUNTER — Other Ambulatory Visit: Payer: Self-pay

## 2020-09-20 ENCOUNTER — Ambulatory Visit (INDEPENDENT_AMBULATORY_CARE_PROVIDER_SITE_OTHER): Payer: Medicare Other | Admitting: General Practice

## 2020-09-20 DIAGNOSIS — E538 Deficiency of other specified B group vitamins: Secondary | ICD-10-CM | POA: Diagnosis not present

## 2020-09-20 DIAGNOSIS — G63 Polyneuropathy in diseases classified elsewhere: Secondary | ICD-10-CM

## 2020-09-23 DIAGNOSIS — E538 Deficiency of other specified B group vitamins: Secondary | ICD-10-CM | POA: Insufficient documentation

## 2020-09-23 DIAGNOSIS — G63 Polyneuropathy in diseases classified elsewhere: Secondary | ICD-10-CM | POA: Insufficient documentation

## 2020-09-23 NOTE — Progress Notes (Signed)
I have reviewed and agree.

## 2020-09-27 ENCOUNTER — Other Ambulatory Visit: Payer: Self-pay

## 2020-09-27 ENCOUNTER — Ambulatory Visit: Payer: Medicare Other

## 2020-09-30 ENCOUNTER — Other Ambulatory Visit: Payer: Self-pay

## 2020-09-30 ENCOUNTER — Ambulatory Visit (INDEPENDENT_AMBULATORY_CARE_PROVIDER_SITE_OTHER): Payer: Medicare Other

## 2020-09-30 DIAGNOSIS — E538 Deficiency of other specified B group vitamins: Secondary | ICD-10-CM | POA: Diagnosis not present

## 2020-09-30 DIAGNOSIS — G63 Polyneuropathy in diseases classified elsewhere: Secondary | ICD-10-CM

## 2020-09-30 NOTE — Progress Notes (Signed)
Pt here for weekly injection #3 of 3 B12 injection per  Dr Ronnald Ramp.  B12 1071mcg given IM left deltoid and pt tolerated injection well.  Pt has appt with PCP 10/31/20 & will receive monthly b12 at that time.

## 2020-10-24 DIAGNOSIS — H2513 Age-related nuclear cataract, bilateral: Secondary | ICD-10-CM | POA: Diagnosis not present

## 2020-10-24 DIAGNOSIS — H25013 Cortical age-related cataract, bilateral: Secondary | ICD-10-CM | POA: Diagnosis not present

## 2020-10-31 ENCOUNTER — Ambulatory Visit (INDEPENDENT_AMBULATORY_CARE_PROVIDER_SITE_OTHER): Payer: Medicare Other | Admitting: Internal Medicine

## 2020-10-31 ENCOUNTER — Other Ambulatory Visit: Payer: Self-pay

## 2020-10-31 ENCOUNTER — Encounter: Payer: Self-pay | Admitting: Internal Medicine

## 2020-10-31 VITALS — BP 124/86 | HR 97 | Temp 98.3°F | Resp 16 | Ht 62.0 in | Wt 142.0 lb

## 2020-10-31 DIAGNOSIS — E538 Deficiency of other specified B group vitamins: Secondary | ICD-10-CM

## 2020-10-31 DIAGNOSIS — N1832 Chronic kidney disease, stage 3b: Secondary | ICD-10-CM | POA: Diagnosis not present

## 2020-10-31 DIAGNOSIS — Z0001 Encounter for general adult medical examination with abnormal findings: Secondary | ICD-10-CM

## 2020-10-31 DIAGNOSIS — I10 Essential (primary) hypertension: Secondary | ICD-10-CM | POA: Diagnosis not present

## 2020-10-31 DIAGNOSIS — M1711 Unilateral primary osteoarthritis, right knee: Secondary | ICD-10-CM | POA: Diagnosis not present

## 2020-10-31 DIAGNOSIS — Z0000 Encounter for general adult medical examination without abnormal findings: Secondary | ICD-10-CM

## 2020-10-31 DIAGNOSIS — G63 Polyneuropathy in diseases classified elsewhere: Secondary | ICD-10-CM | POA: Diagnosis not present

## 2020-10-31 DIAGNOSIS — E785 Hyperlipidemia, unspecified: Secondary | ICD-10-CM

## 2020-10-31 DIAGNOSIS — T502X5A Adverse effect of carbonic-anhydrase inhibitors, benzothiadiazides and other diuretics, initial encounter: Secondary | ICD-10-CM

## 2020-10-31 DIAGNOSIS — E876 Hypokalemia: Secondary | ICD-10-CM

## 2020-10-31 DIAGNOSIS — D126 Benign neoplasm of colon, unspecified: Secondary | ICD-10-CM

## 2020-10-31 LAB — LIPID PANEL
Cholesterol: 231 mg/dL — ABNORMAL HIGH (ref 0–200)
HDL: 45.5 mg/dL (ref >39.00)
LDL Cholesterol: 158 mg/dL — ABNORMAL HIGH (ref 0–99)
NonHDL: 185.76
Total CHOL/HDL Ratio: 5
Triglycerides: 138 mg/dL (ref 0.0–149.0)
VLDL: 27.6 mg/dL (ref 0.0–40.0)

## 2020-10-31 LAB — BASIC METABOLIC PANEL
BUN: 12 mg/dL (ref 6–23)
CO2: 32 mEq/L (ref 19–32)
Calcium: 10 mg/dL (ref 8.4–10.5)
Chloride: 100 mEq/L (ref 96–112)
Creatinine, Ser: 0.9 mg/dL (ref 0.40–1.20)
GFR: 66.51 mL/min (ref >60.00)
Glucose, Bld: 101 mg/dL — ABNORMAL HIGH (ref 70–99)
Potassium: 3.5 mEq/L (ref 3.5–5.1)
Sodium: 142 mEq/L (ref 135–145)

## 2020-10-31 LAB — MAGNESIUM: Magnesium: 2 mg/dL (ref 1.5–2.5)

## 2020-10-31 LAB — CK: Total CK: 100 U/L (ref 7–177)

## 2020-10-31 MED ORDER — CARVEDILOL 3.125 MG PO TABS: 3.1250 mg | ORAL_TABLET | Freq: Two times a day (BID) | ORAL | 1 refills | Status: DC

## 2020-10-31 MED ORDER — CYANOCOBALAMIN 1000 MCG/ML IJ SOLN
1000.0000 ug | Freq: Once | INTRAMUSCULAR | Status: AC
Start: 2020-10-31 — End: 2020-10-31
  Administered 2020-10-31: 1000 ug via INTRAMUSCULAR

## 2020-10-31 NOTE — Progress Notes (Signed)
Subjective:  Patient ID: Julie Rivas, female    DOB: 1956-05-08  Age: 65 y.o. MRN: [id number]  CC: Hypertension, Annual Exam, and Hyperlipidemia  This visit occurred during the SARS-CoV-2 public health emergency.  Safety protocols were in place, including screening questions prior to the visit, additional usage of staff PPE, and extensive cleaning of exam room while observing appropriate contact time as indicated for disinfecting solutions.    HPI Julie Rivas presents for a CPX.  She tells me her blood pressure has been well controlled.  She is active and denies any recent episodes of chest pain, shortness of breath, palpitations, edema, or fatigue.  She continues to complain of pain in her knees, worse on the right than the left.  She is no longer taking indomethacin.  She tells me that Bystolic needs to be changed to lower cost generic.  Outpatient Medications Prior to Visit  Medication Sig Dispense Refill  . clonazePAM (KLONOPIN) 0.5 MG tablet Take 1 tablet (0.5 mg total) by mouth 3 (three) times daily as needed for anxiety. 270 tablet 1  . cyclobenzaprine (FLEXERIL) 10 MG tablet TAKE 1 TABLET BY MOUTH UP TO THREE TIMES DAILY 90 tablet 0  . gabapentin (NEURONTIN) 300 MG capsule Take 1 capsule by mouth at bedtime 90 capsule 0  . potassium chloride SA (KLOR-CON) 20 MEQ tablet Take 1 tablet (20 mEq total) by mouth 2 (two) times daily. 180 tablet 0  . rosuvastatin (CRESTOR) 20 MG tablet Take 1 tablet (20 mg total) by mouth daily. 90 tablet 1  . indomethacin (INDOCIN) 50 MG capsule TAKE 1 CAPSULE BY MOUTH THREE TIMES DAILY WITH MEALS 90 capsule 0  . nebivolol (BYSTOLIC) 5 MG tablet Take 1 tablet (5 mg total) by mouth daily. 90 tablet 0   No facility-administered medications prior to visit.    ROS Review of Systems  Constitutional: Negative.  Negative for appetite change, diaphoresis, fatigue and unexpected weight change.  HENT: Negative.   Eyes: Negative.   Respiratory:  Negative for cough, chest tightness, shortness of breath and wheezing.   Cardiovascular: Negative for chest pain, palpitations and leg swelling.  Gastrointestinal: Negative for abdominal pain, diarrhea, nausea and vomiting.  Endocrine: Negative.   Genitourinary: Negative.  Negative for difficulty urinating.  Musculoskeletal: Positive for arthralgias. Negative for myalgias.  Skin: Negative.   Neurological: Negative.  Negative for dizziness, weakness, light-headedness, numbness and headaches.  Hematological: Negative for adenopathy. Does not bruise/bleed easily.  Psychiatric/Behavioral: Negative.     Objective:  BP 124/86   Pulse 97   Temp 98.3 F (36.8 C) (Oral)   Resp 16   Ht 5\' 2"  (1.575 m)   Wt 142 lb (64.4 kg)   SpO2 97%   BMI 25.97 kg/m   BP Readings from Last 3 Encounters:  10/31/20 124/86  09/12/20 126/76  07/30/20 136/80    Wt Readings from Last 3 Encounters:  10/31/20 142 lb (64.4 kg)  09/12/20 145 lb (65.8 kg)  07/30/20 144 lb (65.3 kg)    Physical Exam Vitals reviewed.  Constitutional:      Appearance: Normal appearance.  HENT:     Nose: Nose normal.     Mouth/Throat:     Mouth: Mucous membranes are moist.  Eyes:     General: No scleral icterus.    Conjunctiva/sclera: Conjunctivae normal.  Cardiovascular:     Rate and Rhythm: Normal rate and regular rhythm.     Heart sounds: No murmur heard.   Pulmonary:  Effort: Pulmonary effort is normal.     Breath sounds: No stridor. No wheezing, rhonchi or rales.  Abdominal:     General: Abdomen is flat. Bowel sounds are normal.     Palpations: There is no hepatomegaly, splenomegaly or mass.     Tenderness: There is no abdominal tenderness. There is no guarding.  Musculoskeletal:        General: Normal range of motion.     Cervical back: Neck supple.     Right lower leg: No edema.     Left lower leg: No edema.  Lymphadenopathy:     Cervical: No cervical adenopathy.  Skin:    General: Skin is warm  and dry.     Coloration: Skin is not pale.  Neurological:     General: No focal deficit present.     Mental Status: She is alert and oriented to person, place, and time. Mental status is at baseline.  Psychiatric:        Mood and Affect: Mood normal.        Behavior: Behavior normal.     Lab Results  Component Value Date   WBC 6.7 09/12/2020   HGB 14.0 09/12/2020   HCT 40.3 09/12/2020   PLT 218.0 09/12/2020   GLUCOSE 101 (H) 10/31/2020   CHOL 231 (H) 10/31/2020   TRIG 138.0 10/31/2020   HDL 45.50 10/31/2020   LDLCALC 158 (H) 10/31/2020   ALT 13 07/10/2020   AST 16 07/10/2020   NA 142 10/31/2020   K 3.5 10/31/2020   CL 100 10/31/2020   CREATININE 0.90 10/31/2020   BUN 12 10/31/2020   CO2 32 10/31/2020   TSH 1.18 07/10/2020   PSA WNL 10/30/2015   INR 1.1 (H) 12/31/2015   HGBA1C 6.2 07/10/2020    MM 3D SCREEN BREAST BILATERAL  Result Date: 08/29/2020 CLINICAL DATA:  Screening. EXAM: DIGITAL SCREENING BILATERAL MAMMOGRAM WITH TOMO AND CAD COMPARISON:  Previous exam(s). ACR Breast Density Category c: The breast tissue is heterogeneously dense, which may obscure small masses. FINDINGS: There are no findings suspicious for malignancy. Images were processed with CAD. IMPRESSION: No mammographic evidence of malignancy. A result letter of this screening mammogram will be mailed directly to the patient. RECOMMENDATION: Screening mammogram in one year. (Code:SM-B-01Y) BI-RADS CATEGORY  1: Negative. Electronically Signed   By: Lovey Newcomer M.D.   On: 08/29/2020 12:07    Assessment & Plan:   Julie Rivas was seen today for hypertension, annual exam and hyperlipidemia.  Diagnoses and all orders for this visit:  Hyperlipidemia with target LDL less than 130- She has not achieved her LDL goal.  I have asked her to be more compliant with the statin. -     Lipid panel; Future -     CK; Future -     Lipid panel -     CK  Diuretic-induced hypokalemia- Her potassium level is 3.5.  I have  asked her to continue taking the potassium supplement. -     Basic metabolic panel; Future -     Magnesium; Future -     Basic metabolic panel -     Magnesium  Vitamin B12 deficiency neuropathy ([facility]) -     cyanocobalamin ((VITAMIN B-12)) injection 1,000 mcg  Primary osteoarthritis of right knee -     Ambulatory referral to Sports Medicine  Essential hypertension- Her blood pressure is adequately well controlled. -     carvedilol (COREG) 3.125 MG tablet; Take 1 tablet (3.125 mg total) by mouth  2 (two) times daily with a meal.  Encounter for general adult medical examination with abnormal findings- Exam completed, labs reviewed, vaccines reviewed and updated, cancer screenings addressed, patient education material was given.  Adenomatous polyp of colon, unspecified part of colon -     Ambulatory referral to Gastroenterology  Stage 3b chronic kidney disease ([place])- Her blood pressure is well controlled.  I recommended that she avoid NSAIDs.  Routine general medical examination at a health care facility   I have discontinued Abena G. Krienke's nebivolol and indomethacin. I am also having her start on carvedilol. Additionally, I am having her maintain her cyclobenzaprine, gabapentin, rosuvastatin, clonazePAM, and potassium chloride SA. We administered cyanocobalamin.  Meds ordered this encounter  Medications  . cyanocobalamin ((VITAMIN B-12)) injection 1,000 mcg  . carvedilol (COREG) 3.125 MG tablet    Sig: Take 1 tablet (3.125 mg total) by mouth 2 (two) times daily with a meal.    Dispense:  180 tablet    Refill:  1     Follow-up: Return in about 6 months (around 04/30/2021).  Scarlette Calico, MD

## 2020-10-31 NOTE — Patient Instructions (Signed)
Health Maintenance, Female Adopting a healthy lifestyle and getting preventive care are important in promoting health and wellness. Ask your health care provider about:  The right schedule for you to have regular tests and exams.  Things you can do on your own to prevent diseases and keep yourself healthy. What should I know about diet, weight, and exercise? Eat a healthy diet  Eat a diet that includes plenty of vegetables, fruits, low-fat dairy products, and lean protein.  Do not eat a lot of foods that are high in solid fats, added sugars, or sodium.   Maintain a healthy weight Body mass index (BMI) is used to identify weight problems. It estimates body fat based on height and weight. Your health care provider can help determine your BMI and help you achieve or maintain a healthy weight. Get regular exercise Get regular exercise. This is one of the most important things you can do for your health. Most adults should:  Exercise for at least 150 minutes each week. The exercise should increase your heart rate and make you sweat (moderate-intensity exercise).  Do strengthening exercises at least twice a week. This is in addition to the moderate-intensity exercise.  Spend less time sitting. Even light physical activity can be beneficial. Watch cholesterol and blood lipids Have your blood tested for lipids and cholesterol at 67 years of age, then have this test every 5 years. Have your cholesterol levels checked more often if:  Your lipid or cholesterol levels are high.  You are older than 67 years of age.  You are at high risk for heart disease. What should I know about cancer screening? Depending on your health history and family history, you may need to have cancer screening at various ages. This may include screening for:  Breast cancer.  Cervical cancer.  Colorectal cancer.  Skin cancer.  Lung cancer. What should I know about heart disease, diabetes, and high blood  pressure? Blood pressure and heart disease  High blood pressure causes heart disease and increases the risk of stroke. This is more likely to develop in people who have high blood pressure readings, are of African descent, or are overweight.  Have your blood pressure checked: ? Every 3-5 years if you are 18-39 years of age. ? Every year if you are 40 years old or older. Diabetes Have regular diabetes screenings. This checks your fasting blood sugar level. Have the screening done:  Once every three years after age 40 if you are at a normal weight and have a low risk for diabetes.  More often and at a younger age if you are overweight or have a high risk for diabetes. What should I know about preventing infection? Hepatitis B If you have a higher risk for hepatitis B, you should be screened for this virus. Talk with your health care provider to find out if you are at risk for hepatitis B infection. Hepatitis C Testing is recommended for:  Everyone born from 1945 through 1965.  Anyone with known risk factors for hepatitis C. Sexually transmitted infections (STIs)  Get screened for STIs, including gonorrhea and chlamydia, if: ? You are sexually active and are younger than 67 years of age. ? You are older than 67 years of age and your health care provider tells you that you are at risk for this type of infection. ? Your sexual activity has changed since you were last screened, and you are at increased risk for chlamydia or gonorrhea. Ask your health care provider   if you are at risk.  Ask your health care provider about whether you are at high risk for HIV. Your health care provider may recommend a prescription medicine to help prevent HIV infection. If you choose to take medicine to prevent HIV, you should first get tested for HIV. You should then be tested every 3 months for as long as you are taking the medicine. Pregnancy  If you are about to stop having your period (premenopausal) and  you may become pregnant, seek counseling before you get pregnant.  Take 400 to 800 micrograms (mcg) of folic acid every day if you become pregnant.  Ask for birth control (contraception) if you want to prevent pregnancy. Osteoporosis and menopause Osteoporosis is a disease in which the bones lose minerals and strength with aging. This can result in bone fractures. If you are 65 years old or older, or if you are at risk for osteoporosis and fractures, ask your health care provider if you should:  Be screened for bone loss.  Take a calcium or vitamin D supplement to lower your risk of fractures.  Be given hormone replacement therapy (HRT) to treat symptoms of menopause. Follow these instructions at home: Lifestyle  Do not use any products that contain nicotine or tobacco, such as cigarettes, e-cigarettes, and chewing tobacco. If you need help quitting, ask your health care provider.  Do not use street drugs.  Do not share needles.  Ask your health care provider for help if you need support or information about quitting drugs. Alcohol use  Do not drink alcohol if: ? Your health care provider tells you not to drink. ? You are pregnant, may be pregnant, or are planning to become pregnant.  If you drink alcohol: ? Limit how much you use to 0-1 drink a day. ? Limit intake if you are breastfeeding.  Be aware of how much alcohol is in your drink. In the [place] one drink equals one 12 oz bottle of beer (355 mL), one 5 oz glass of wine (148 mL), or one 1 oz glass of hard liquor (44 mL). General instructions  Schedule regular health, dental, and eye exams.  Stay current with your vaccines.  Tell your health care provider if: ? You often feel depressed. ? You have ever been abused or do not feel safe at home. Summary  Adopting a healthy lifestyle and getting preventive care are important in promoting health and wellness.  Follow your health care provider's instructions about healthy  diet, exercising, and getting tested or screened for diseases.  Follow your health care provider's instructions on monitoring your cholesterol and blood pressure. This information is not intended to replace advice given to you by your health care provider. Make sure you discuss any questions you have with your health care provider. Document Revised: 08/17/2018 Document Reviewed: 08/17/2018 Elsevier Patient Education  2021 Elsevier Inc.  

## 2020-11-01 NOTE — Progress Notes (Signed)
Subjective:    CC: R knee pain  I, Molly Weber, LAT, ATC, am serving as scribe for Dr. [name].  HPI: Pt is a 65 y/o female presenting w/ R knee pain ongoing for the last year w/ no known MOI.  She locates her pain to lateral aspect of R knee.  R knee swelling: yes- feels tingly and warm R knee mechanical symptoms: no Aggravating factors: sitting w/ knee flexed for extended periods Treatments tried: creams  Pertinent review of Systems: No fevers or chills  Relevant historical information: B12 deficiency with neuropathy and CKD.   Objective:    Vitals:   11/04/20 1041  BP: 116/72  Pulse: (!) 108  SpO2: 98%   General: Well Developed, well nourished, and in no acute distress.   MSK: Right knee mildly swollen. Normal-appearing otherwise. Normal motion with crepitation. Tender palpation lateral knee at femoral condyle. Stable ligamentous exam. Negative McMurray's test. Intact strength.  Lab and Radiology Results  X-ray images right knee obtained today personally and independently interpreted Mild medial compartment DJD. Await formal radiology review  Procedure: Real-time Ultrasound Guided Injection of right knee superior lateral patellar space Device: Philips Affiniti 50G Images permanently stored and available for review in PACS Ultrasound evaluation shows mild joint effusion.  Narrowed medial and lateral joint line.  Mild hypoechoic fluid collection near lateral femoral condyle.  Normal bony structure of femoral condyle. Verbal informed consent obtained.  Discussed risks and benefits of procedure. Warned about infection bleeding damage to structures skin hypopigmentation and fat atrophy among others. Patient expresses understanding and agreement Time-out conducted.   Noted no overlying erythema, induration, or other signs of local infection.   Skin prepped in a sterile fashion.   Local anesthesia: Topical Ethyl chloride.   With sterile technique and  under real time ultrasound guidance:  40 mg of Kenalog and 2 mL of Marcaine injected into right knee. Fluid seen entering the knee joint.   Completed without difficulty   Pain immediately resolved suggesting accurate placement of the medication.   Advised to call if fevers/chills, erythema, induration, drainage, or persistent bleeding.   Images permanently stored and available for review in the ultrasound unit.  Impression: Technically successful ultrasound guided injection.        Impression and Recommendations:    Assessment and Plan: 65 y.o. female with knee pain at lateral femoral condyle.  May be exacerbation of DJD however patient does not have much DJD on x-ray.  Could be friction from IT band.  Plan for steroid injection into the joint today as well as home exercise program.  Consider formal physical therapy referral as needed.  Additionally recommend Voltaren gel.Marland Kitchen  PDMP not reviewed this encounter. Orders Placed This Encounter  Procedures  . [place] LIMITED JOINT SPACE STRUCTURES LOW RIGHT(NO LINKED CHARGES)    Standing Status:   Future    Number of Occurrences:   1    Standing Expiration Date:   05/04/2021    Order Specific Question:   Reason for Exam (SYMPTOM  OR DIAGNOSIS REQUIRED)    Answer:   chronic right knee pain    Order Specific Question:   Preferred imaging location?    Answer:   [facility]  . DG Knee AP/LAT W/Sun[place] Right    Standing Status:   Future    Number of Occurrences:   1    Standing Expiration Date:   11/04/2021    Order Specific Question:   Reason for Exam (SYMPTOM  OR DIAGNOSIS REQUIRED)    Answer:   eval lateral knee pain    Order Specific Question:   Preferred imaging location?    Answer:   [facility]   No orders of the defined types were placed in this encounter.   Discussed warning signs or symptoms. Please see discharge instructions. Patient expresses understanding.   The above documentation has been  reviewed and is accurate and complete [name], M.D.

## 2020-11-04 ENCOUNTER — Other Ambulatory Visit: Payer: Self-pay

## 2020-11-04 ENCOUNTER — Ambulatory Visit (INDEPENDENT_AMBULATORY_CARE_PROVIDER_SITE_OTHER): Payer: Medicare Other

## 2020-11-04 ENCOUNTER — Ambulatory Visit (INDEPENDENT_AMBULATORY_CARE_PROVIDER_SITE_OTHER): Payer: Medicare Other | Admitting: Family Medicine

## 2020-11-04 ENCOUNTER — Ambulatory Visit: Payer: Self-pay

## 2020-11-04 VITALS — BP 116/72 | HR 108 | Ht 62.0 in | Wt 144.4 lb

## 2020-11-04 DIAGNOSIS — G8929 Other chronic pain: Secondary | ICD-10-CM

## 2020-11-04 DIAGNOSIS — M25561 Pain in right knee: Secondary | ICD-10-CM

## 2020-11-04 DIAGNOSIS — M25461 Effusion, right knee: Secondary | ICD-10-CM | POA: Diagnosis not present

## 2020-11-04 NOTE — Patient Instructions (Addendum)
Thank you for coming in today.  Please get an Xray today before you leave today!  You received an injection today. Seek immediate medical attention if the joint becomes red, extremely painful, or is oozing fluid.  Please complete the exercises that the athletic trainer went over with you:  View at [email or web address] using code: [id number]  Recheck back in 6 weeks.

## 2020-11-05 NOTE — Progress Notes (Signed)
Moderate arthritis is present in the knee.  Small bone spur is present at the top of the kneecap with a quadriceps tendon attaches to the kneecap.

## 2020-11-18 ENCOUNTER — Telehealth: Payer: Self-pay | Admitting: Internal Medicine

## 2020-11-18 NOTE — Telephone Encounter (Signed)
[place], [address] Phone:  [phone]  Fax:  [phone]     Patient requesting the tubes that go into the nebulizer to be sent in.  Last seen- 02.24.22 Next apt- 03.25.22

## 2020-11-19 NOTE — Telephone Encounter (Signed)
Called pt, LVM to call back to discuss and clarify what she needs.

## 2020-11-21 NOTE — Telephone Encounter (Signed)
Patient states its the medication that goes inside the machine, she said she thinks its albuterol

## 2020-11-22 ENCOUNTER — Other Ambulatory Visit: Payer: Self-pay | Admitting: Family

## 2020-11-22 DIAGNOSIS — J4540 Moderate persistent asthma, uncomplicated: Secondary | ICD-10-CM

## 2020-11-22 MED ORDER — ALBUTEROL SULFATE (2.5 MG/3ML) 0.083% IN NEBU
2.5000 mg | INHALATION_SOLUTION | Freq: Four times a day (QID) | RESPIRATORY_TRACT | 2 refills | Status: DC | PRN
Start: 2020-11-22 — End: 2021-07-16

## 2020-11-22 NOTE — Telephone Encounter (Signed)
Pt aware that prescription has been filled to pharmacy on file.  Pt is very appreciative.

## 2020-11-22 NOTE — Telephone Encounter (Signed)
Pt states that she feels fine now, but has used all of her albuterol & is concerned with the weather changing that she will need it & does not have it.  Pt confirms that she told assistant that she does not take albuterol, but meant that she does not "currently" take albuterol.  Pt instructed that in the future to say that she takes some medications "as needed" so it won't be removed from her med list.  Pt verb understanding.  Pt advised that PCP out of office but will ask another provider to fill prescription for there .  Verified that she wants it filled to [facility] on [address].

## 2020-11-22 NOTE — Telephone Encounter (Signed)
Prescription has been sent.

## 2020-11-28 DIAGNOSIS — H25043 Posterior subcapsular polar age-related cataract, bilateral: Secondary | ICD-10-CM | POA: Diagnosis not present

## 2020-11-28 DIAGNOSIS — H25013 Cortical age-related cataract, bilateral: Secondary | ICD-10-CM | POA: Diagnosis not present

## 2020-11-28 DIAGNOSIS — H18413 Arcus senilis, bilateral: Secondary | ICD-10-CM | POA: Diagnosis not present

## 2020-11-28 DIAGNOSIS — H2512 Age-related nuclear cataract, left eye: Secondary | ICD-10-CM | POA: Diagnosis not present

## 2020-11-28 DIAGNOSIS — H2513 Age-related nuclear cataract, bilateral: Secondary | ICD-10-CM | POA: Diagnosis not present

## 2020-11-29 ENCOUNTER — Encounter: Payer: Self-pay | Admitting: Internal Medicine

## 2020-11-29 ENCOUNTER — Other Ambulatory Visit: Payer: Self-pay

## 2020-11-29 ENCOUNTER — Ambulatory Visit (INDEPENDENT_AMBULATORY_CARE_PROVIDER_SITE_OTHER): Payer: Medicare Other

## 2020-11-29 DIAGNOSIS — E538 Deficiency of other specified B group vitamins: Secondary | ICD-10-CM

## 2020-11-29 DIAGNOSIS — G63 Polyneuropathy in diseases classified elsewhere: Secondary | ICD-10-CM | POA: Diagnosis not present

## 2020-11-29 MED ORDER — CYANOCOBALAMIN 1000 MCG/ML IJ SOLN
1000.0000 ug | Freq: Once | INTRAMUSCULAR | Status: AC
Start: 2020-11-29 — End: 2020-11-29
  Administered 2020-11-29: 1000 ug via INTRAMUSCULAR

## 2020-11-29 NOTE — Progress Notes (Signed)
B12 given Please cosign 

## 2020-12-12 ENCOUNTER — Ambulatory Visit: Payer: Medicare Other | Admitting: Internal Medicine

## 2020-12-12 ENCOUNTER — Encounter: Payer: Self-pay | Admitting: Internal Medicine

## 2020-12-16 ENCOUNTER — Ambulatory Visit: Payer: BC Managed Care – PPO | Admitting: Family Medicine

## 2020-12-18 DIAGNOSIS — H25012 Cortical age-related cataract, left eye: Secondary | ICD-10-CM | POA: Diagnosis not present

## 2020-12-18 DIAGNOSIS — H2512 Age-related nuclear cataract, left eye: Secondary | ICD-10-CM | POA: Diagnosis not present

## 2020-12-19 DIAGNOSIS — H2511 Age-related nuclear cataract, right eye: Secondary | ICD-10-CM | POA: Diagnosis not present

## 2021-01-01 DIAGNOSIS — H2511 Age-related nuclear cataract, right eye: Secondary | ICD-10-CM | POA: Diagnosis not present

## 2021-01-03 ENCOUNTER — Ambulatory Visit (INDEPENDENT_AMBULATORY_CARE_PROVIDER_SITE_OTHER): Payer: Medicare Other

## 2021-01-03 ENCOUNTER — Other Ambulatory Visit: Payer: Self-pay

## 2021-01-03 DIAGNOSIS — E538 Deficiency of other specified B group vitamins: Secondary | ICD-10-CM

## 2021-01-03 DIAGNOSIS — G63 Polyneuropathy in diseases classified elsewhere: Secondary | ICD-10-CM

## 2021-01-03 MED ORDER — CYANOCOBALAMIN 1000 MCG/ML IJ SOLN
1000.0000 ug | Freq: Once | INTRAMUSCULAR | Status: AC
  Administered 2021-01-03: 1000 ug via INTRAMUSCULAR

## 2021-01-03 NOTE — Progress Notes (Signed)
Pt here for monthly B12 injection per Dr. Ronnald Ramp  B12 1075mcg given in right deltoid IM, and pt tolerated injection well.  Next B12 injection scheduled for 02/10/21.

## 2021-01-07 ENCOUNTER — Ambulatory Visit (AMBULATORY_SURGERY_CENTER): Payer: Self-pay | Admitting: *Deleted

## 2021-01-07 ENCOUNTER — Other Ambulatory Visit: Payer: Self-pay

## 2021-01-07 VITALS — Ht 62.0 in | Wt 147.2 lb

## 2021-01-07 DIAGNOSIS — Z86010 Personal history of colonic polyps: Secondary | ICD-10-CM

## 2021-01-07 MED ORDER — SUTAB 1479-225-188 MG PO TABS: 24.0000 | ORAL_TABLET | ORAL | 0 refills | Status: DC

## 2021-01-07 NOTE — Progress Notes (Signed)
No egg or soy allergy known to patient  No issues with past sedation with any surgeries or procedures Patient denies ever being told they had issues or difficulty with intubation  No FH of Malignant Hyperthermia No diet pills per patient No home 02 use per patient  No blood thinners per patient  Pt denies issues with constipation  No A fib or A flutter  EMMI video to pt or via [place] 19 guidelines implemented in [place] today with Pt and RN  Pt is fully vaccinated  for Covid   Due to the COVID-19 pandemic we are asking patients to follow certain guidelines.  Pt aware of COVID protocols and [facility] guidelines   SUTAB SAMPLE GIVEN - PT STATES SHE IS ON A FIXED INCOME AND CANNOT AFFORD THE 40-50$ CO PAY -

## 2021-01-15 ENCOUNTER — Encounter: Payer: Self-pay | Admitting: Internal Medicine

## 2021-01-17 ENCOUNTER — Other Ambulatory Visit: Payer: Self-pay

## 2021-01-17 ENCOUNTER — Ambulatory Visit (AMBULATORY_SURGERY_CENTER): Payer: Medicare Other | Admitting: Internal Medicine

## 2021-01-17 ENCOUNTER — Encounter: Payer: Self-pay | Admitting: Internal Medicine

## 2021-01-17 VITALS — BP 113/82 | HR 88 | Temp 97.5°F | Resp 15 | Ht 62.0 in | Wt 147.2 lb

## 2021-01-17 DIAGNOSIS — D124 Benign neoplasm of descending colon: Secondary | ICD-10-CM

## 2021-01-17 DIAGNOSIS — Z86010 Personal history of colonic polyps: Secondary | ICD-10-CM

## 2021-01-17 DIAGNOSIS — Z1211 Encounter for screening for malignant neoplasm of colon: Secondary | ICD-10-CM | POA: Diagnosis not present

## 2021-01-17 MED ORDER — SODIUM CHLORIDE 0.9 % IV SOLN: 500.0000 mL | Freq: Once | INTRAVENOUS | Status: DC

## 2021-01-17 NOTE — Progress Notes (Signed)
A and O x3. Report to RN. Tolerated MAC anesthesia well.

## 2021-01-17 NOTE — Op Note (Signed)
[facility] Patient Name: Naliya Gish Procedure Date: 01/17/2021 8:17 AM MRN: [id number] Endoscopist: Docia Chuck. Henrene Pastor , MD Age: 65 Referring MD:  Date of Birth: Nov 18, 1955 Gender: Female Account #: [id number] Procedure:                Colonoscopy with cold snare polypectomy x 1 Indications:              High risk colon cancer surveillance: Personal                            history of non-advanced adenoma. January 2015 Medicines:                Monitored Anesthesia Care Procedure:                Pre-Anesthesia Assessment:                           - Prior to the procedure, a History and Physical                            was performed, and patient medications and                            allergies were reviewed. The patient's tolerance of                            previous anesthesia was also reviewed. The risks                            and benefits of the procedure and the sedation                            options and risks were discussed with the patient.                            All questions were answered, and informed consent                            was obtained. Prior Anticoagulants: The patient has                            taken no previous anticoagulant or antiplatelet                            agents. ASA Grade Assessment: II - A patient with                            mild systemic disease. After reviewing the risks                            and benefits, the patient was deemed in                            satisfactory condition to undergo the procedure.  After obtaining informed consent, the colonoscope                            was passed under direct vision. Throughout the                            procedure, the patient's blood pressure, pulse, and                            oxygen saturations were monitored continuously. The                            Olympus CF-HQ190L [id number]) Colonoscope was                             introduced through the anus and advanced to the the                            cecum, identified by appendiceal orifice and                            ileocecal valve. The ileocecal valve, appendiceal                            orifice, and rectum were photographed. The quality                            of the bowel preparation was excellent. The                            colonoscopy was performed without difficulty. The                            patient tolerated the procedure well. The bowel                            preparation used was SUPREP via split dose                            instruction. Scope In: 8:38:02 AM Scope Out: 8:51:27 AM Scope Withdrawal Time: 0 hours 10 minutes 59 seconds  Total Procedure Duration: 0 hours 13 minutes 25 seconds  Findings:                 A 4 mm polyp was found in the descending colon. The                            polyp was removed with a cold snare. Resection and                            retrieval were complete.                           Multiple diverticula were found in the sigmoid  colon.                           The exam was otherwise without abnormality on                            direct and retroflexion views. Complications:            No immediate complications. Estimated blood loss:                            None. Estimated Blood Loss:     Estimated blood loss: none. Impression:               - One 4 mm polyp in the descending colon, removed                            with a cold snare. Resected and retrieved.                           - Diverticulosis in the sigmoid colon.                           - The examination was otherwise normal on direct                            and retroflexion views. Recommendation:           - Repeat colonoscopy in 7 years for surveillance.                           - Patient has a contact number available for                            emergencies. The signs and  symptoms of potential                            delayed complications were discussed with the                            patient. Return to normal activities tomorrow.                            Written discharge instructions were provided to the                            patient.                           - Resume previous diet.                           - Continue present medications.                           - Await pathology results. Docia Chuck. Henrene Pastor, MD 01/17/2021 9:01:47 AM This report has been signed electronically.

## 2021-01-17 NOTE — Patient Instructions (Signed)
Handouts given for diverticulosis and polyps.  Repeat colonoscopy in 7 years.   YOU HAD AN ENDOSCOPIC PROCEDURE TODAY AT [facility]:   Refer to the procedure report that was given to you for any specific questions about what was found during the examination.  If the procedure report does not answer your questions, please call your gastroenterologist to clarify.  If you requested that your care partner not be given the details of your procedure findings, then the procedure report has been included in a sealed envelope for you to review at your convenience later.  YOU SHOULD EXPECT: Some feelings of bloating in the abdomen. Passage of more gas than usual.  Walking can help get rid of the air that was put into your GI tract during the procedure and reduce the bloating. If you had a lower endoscopy (such as a colonoscopy or flexible sigmoidoscopy) you may notice spotting of blood in your stool or on the toilet paper. If you underwent a bowel prep for your procedure, you may not have a normal bowel movement for a few days.  Please Note:  You might notice some irritation and congestion in your nose or some drainage.  This is from the oxygen used during your procedure.  There is no need for concern and it should clear up in a day or so.  SYMPTOMS TO REPORT IMMEDIATELY:   Following lower endoscopy (colonoscopy or flexible sigmoidoscopy):  Excessive amounts of blood in the stool  Significant tenderness or worsening of abdominal pains  Swelling of the abdomen that is new, acute  Fever of 100F or higher  For urgent or emergent issues, a gastroenterologist can be reached at any hour by calling [phone]. Do not use MyChart messaging for urgent concerns.    DIET:  We do recommend a small meal at first, but then you may proceed to your regular diet.  Drink plenty of fluids but you should avoid alcoholic beverages for 24 hours.  ACTIVITY:  You should plan to take it easy for the  rest of today and you should NOT DRIVE or use heavy machinery until tomorrow (because of the sedation medicines used during the test).    FOLLOW UP: Our staff will call the number listed on your records 48-72 hours following your procedure to check on you and address any questions or concerns that you may have regarding the information given to you following your procedure. If we do not reach you, we will leave a message.  We will attempt to reach you two times.  During this call, we will ask if you have developed any symptoms of COVID 19. If you develop any symptoms (ie: fever, flu-like symptoms, shortness of breath, cough etc.) before then, please call [phone].  If you test positive for Covid 19 in the 2 weeks post procedure, please call and report this information to [place].    If any biopsies were taken you will be contacted by phone or by letter within the next 1-3 weeks.  Please call us at [phone] if you have not heard about the biopsies in 3 weeks.    SIGNATURES/CONFIDENTIALITY: You and/or your care partner have signed paperwork which will be entered into your electronic medical record.  These signatures attest to the fact that that the information above on your After Visit Summary has been reviewed and is understood.  Full responsibility of the confidentiality of this discharge information lies with you and/or your care-partner.

## 2021-01-17 NOTE — Progress Notes (Signed)
Called to room to assist during endoscopic procedure.  Patient ID and intended procedure confirmed with present staff. Received instructions for my participation in the procedure from the performing physician.  

## 2021-01-21 ENCOUNTER — Telehealth: Payer: Self-pay | Admitting: *Deleted

## 2021-01-21 NOTE — Telephone Encounter (Signed)
  Follow up Call-  Call back number 01/17/2021  Post procedure Call Back phone  # [phone]  Permission to leave phone message Yes  Some recent data might be hidden     First attempt for follow up phone call. No answer at number given.  Left message on voicemail.

## 2021-01-21 NOTE — Telephone Encounter (Signed)
  Follow up Call-  Call back number 01/17/2021  Post procedure Call Back phone  # [phone]  Permission to leave phone message Yes  Some recent data might be hidden     Patient questions:  Do you have a fever, pain , or abdominal swelling? no Pain Score 0  Have you tolerated food without any problems? Yes.    Have you been able to return to your normal activities? Yes.    Do you have any questions about your discharge instructions: Diet   No. Medications  No. Follow up visit  No.      * If pain score is 4 or above:0. Have you developed a fever since your procedure? no  2.   Have you had an respiratory symptoms (SOB or cough) since your procedure? no  3.   Have you tested positive for COVID 19 since your procedure no  4.   Have you had any family members/close contacts diagnosed with the COVID 19 since your procedure?  no   If yes to any of these questions please route to Joylene John, RN and Joella Prince, RN

## 2021-02-05 ENCOUNTER — Encounter: Payer: Self-pay | Admitting: Internal Medicine

## 2021-02-10 ENCOUNTER — Ambulatory Visit (INDEPENDENT_AMBULATORY_CARE_PROVIDER_SITE_OTHER): Payer: Medicare Other

## 2021-02-10 ENCOUNTER — Other Ambulatory Visit: Payer: Self-pay

## 2021-02-10 DIAGNOSIS — G63 Polyneuropathy in diseases classified elsewhere: Secondary | ICD-10-CM

## 2021-02-10 DIAGNOSIS — E538 Deficiency of other specified B group vitamins: Secondary | ICD-10-CM

## 2021-02-10 MED ORDER — CYANOCOBALAMIN 1000 MCG/ML IJ SOLN
1000.0000 ug | Freq: Once | INTRAMUSCULAR | Status: AC
  Administered 2021-02-10: 1000 ug via INTRAMUSCULAR

## 2021-02-10 NOTE — Progress Notes (Addendum)
Patient here for monthly B12 injection per Dr. Ronnald Ramp  B12 1050mcg given in right deltoid IM, and patient tolerated injection well.  Next B12 injection scheduled for   I have reviewed and agree

## 2021-02-14 ENCOUNTER — Encounter: Payer: BC Managed Care – PPO | Admitting: Internal Medicine

## 2021-02-17 ENCOUNTER — Ambulatory Visit: Payer: Medicare Other | Admitting: Family Medicine

## 2021-02-17 ENCOUNTER — Other Ambulatory Visit: Payer: Self-pay

## 2021-02-17 VITALS — BP 140/92 | HR 95 | Ht 62.0 in | Wt 146.4 lb

## 2021-02-17 DIAGNOSIS — G8929 Other chronic pain: Secondary | ICD-10-CM | POA: Diagnosis not present

## 2021-02-17 DIAGNOSIS — M25561 Pain in right knee: Secondary | ICD-10-CM

## 2021-02-17 LAB — URIC ACID: Uric Acid, Serum: 6.3 mg/dL (ref 2.4–7.0)

## 2021-02-17 NOTE — Patient Instructions (Addendum)
Thank you for coming in today.   Please get labs today before you leave   You should hear from MRI scheduling within 1 week. If you do not hear please let me know.    Please use Voltaren gel (Generic Diclofenac Gel) up to 4x daily for pain as needed.  This is available over-the-counter as both the name brand Voltaren gel and the generic diclofenac gel.   Recheck after the MRI.   I will also try to authorize the gel shots.

## 2021-02-17 NOTE — Progress Notes (Signed)
I, Peterson Lombard, LAT, ATC acting as a scribe for [name], MD.  Julie Rivas is a 65 y.o. female who presents to [place] at [facility] today for continued R knee pain. Pt was last seen by Dr. Georgina Snell on 11/04/20 and was given a R knee steroid injection and advised to work on HEP and use Voltaren gel. Today, pt reports no relief from prior steroid injection and R knee is very painful. Pt notes she just hasn't been able to get back for a f/u. Pt locates pain to suprapatellar area and lateral aspect of her R knee. She notes that the swelling has gone done since 02/10/21.   Dx imaging: 11/04/20 R knee XR  Pertinent review of systems: No fevers or chills  Relevant historical information: No known history of gout.   Exam:  BP (!) 140/92 (BP Location: Left Arm, Patient Position: Sitting, Cuff Size: Normal)   Pulse 95   Ht 5\' 2"  (1.575 m)   Wt 146 lb 6.4 oz (66.4 kg)   SpO2 96%   BMI 26.78 kg/m  General: Well Developed, well nourished, and in no acute distress.   MSK: Left knee tender palpation lateral aspect of knee. Normal motion. Stable ligamentous exam. Mild effusion.    Lab and Radiology Results  EXAM: RIGHT KNEE 3 VIEWS   COMPARISON:  None.   FINDINGS: AP, lateral, patellar sunrise views obtained. There is minimal medial tibiofemoral joint space narrowing. Mild tricompartmental peripheral spurring most prominent in the lateral patellofemoral compartment. Trace knee joint effusion. There is a small quadriceps tendon enthesophyte. No erosion or bony destruction.   IMPRESSION: 1. Mild tricompartmental osteoarthritis with trace knee joint effusion. 2. Small quadriceps tendon enthesophyte.     Electronically Signed   By: Keith Rake M.D.   On: 11/04/2020 21:48    I, [name], personally (independently) visualized and performed the interpretation of the images attached in this note.      Assessment and Plan: 65 y.o. female with  right knee pain.  Etiology somewhat unclear.  Patient has a little bit of knee arthritis on recent x-ray however less than I would expect given the pain she has today.  Suspect she may have a meniscus tear or may be distal IT band syndrome.  Plan to proceed to MRI to further characterize cause of the pain.  She has had trials of conservative management since at least late February with little benefit.   We will proceed with lab work-up for possible gout as well.  Additionally we will work on authorization for hyaluronic acid injections which should be helpful.  Recheck after MRI.  PDMP not reviewed this encounter. Orders Placed This Encounter  Procedures   MR Knee Right Wo Contrast    Standing Status:   Future    Standing Expiration Date:   02/17/2022    Order Specific Question:   What is the patient's sedation requirement?    Answer:   No Sedation    Order Specific Question:   Does the patient have a pacemaker or implanted devices?    Answer:   No    Order Specific Question:   Preferred imaging location?    Answer:   Product/process development scientist (table limit-350lbs)   Uric acid    Standing Status:   Future    Number of Occurrences:   1    Standing Expiration Date:   02/17/2022   No orders of the defined types were placed in this encounter.  Discussed warning signs or symptoms. Please see discharge instructions. Patient expresses understanding.   The above documentation has been reviewed and is accurate and complete [name], M.D.

## 2021-02-18 NOTE — Progress Notes (Signed)
Uric acid is 6.3.  Gout is unlikely the cause of your knee pain.  MRI should be helpful.

## 2021-02-19 LAB — HM DIABETES EYE EXAM

## 2021-03-09 ENCOUNTER — Ambulatory Visit (INDEPENDENT_AMBULATORY_CARE_PROVIDER_SITE_OTHER): Payer: Medicare Other

## 2021-03-09 ENCOUNTER — Other Ambulatory Visit: Payer: Self-pay

## 2021-03-09 DIAGNOSIS — G8929 Other chronic pain: Secondary | ICD-10-CM

## 2021-03-09 DIAGNOSIS — M25561 Pain in right knee: Secondary | ICD-10-CM | POA: Diagnosis not present

## 2021-03-09 DIAGNOSIS — M25461 Effusion, right knee: Secondary | ICD-10-CM | POA: Diagnosis not present

## 2021-03-09 DIAGNOSIS — M7989 Other specified soft tissue disorders: Secondary | ICD-10-CM | POA: Diagnosis not present

## 2021-03-09 DIAGNOSIS — M7121 Synovial cyst of popliteal space [Baker], right knee: Secondary | ICD-10-CM | POA: Diagnosis not present

## 2021-03-09 DIAGNOSIS — M1711 Unilateral primary osteoarthritis, right knee: Secondary | ICD-10-CM | POA: Diagnosis not present

## 2021-03-12 ENCOUNTER — Telehealth: Payer: Self-pay | Admitting: *Deleted

## 2021-03-12 NOTE — Progress Notes (Signed)
MRI right knee shows arthritis in the knee substantial and areas especially underneath the kneecap.  There is a small area where a little chunk of cartilage is being knocked loose.  A little bit of fluid is collecting in the knee and just behind the knee as well.  We will discuss this in more detail during your follow-up appointment on the seventh.

## 2021-03-12 NOTE — Progress Notes (Signed)
I, Peterson Lombard, LAT, ATC acting as a scribe for Julie Leader, MD.  Julie Rivas is a 65 y.o. female who presents to [place] at [facility] today for f/u chronic R knee pain and MRI review. Pt was last seen by Dr. Georgina Snell on 02/17/21 and was advised to proceed to MRI. Today, pt reports R knee has been very painful. Pt locates pain to the anterior-lateral aspect of R knee. Pt notes swelling is typically present, but is not this morning.  Dx testing: 03/09/21 R knee MRI  02/17/21 Labs (uric acid)  11/04/20 R knee XR   Pertinent review of systems: No fevers or chills  Relevant historical information: Hypertension   Exam:  BP 134/86 (BP Location: Right Arm, Patient Position: Sitting, Cuff Size: Normal)   Pulse 90   Ht 5\' 2"  (1.575 m)   Wt 145 lb 9.6 oz (66 kg)   SpO2 95%   BMI 26.63 kg/m  General: Well Developed, well nourished, and in no acute distress.   MSK: Right knee normal. Normal motion with crepitation.    Lab and Radiology Results No results found for this or any previous visit (from the past 72 hour(s)). MR Knee Right Wo Contrast  Result Date: 03/11/2021 CLINICAL DATA:  Right lateral knee pain and swelling for 6-8 months. EXAM: MRI OF THE RIGHT KNEE WITHOUT CONTRAST TECHNIQUE: Multiplanar, multisequence MR imaging of the knee was performed. No intravenous contrast was administered. COMPARISON:  Radiographs 11/04/2020 FINDINGS: MENISCI Medial meniscus:  Unremarkable Lateral meniscus:  Unremarkable LIGAMENTS Cruciates:  Unremarkable Collaterals:  Unremarkable CARTILAGE Patellofemoral: Prominent chondral thinning along portions of the posterior patellar ridge and adjacent medial and lateral facets with underlying subcortical marrow edema is shown for example on image 11 series 3. Small focal non-fragmented osteochondral injury along the posterior patellar ridge on image 11 series 3. Mild to moderate chondral thinning in the femoral trochlear groove  inferiorly. Medial:  Moderate degenerative chondral thinning. Lateral:  Unremarkable Joint:  Small knee effusion. Popliteal Fossa:  Small Baker's cyst. Extensor Mechanism:  Unremarkable Bones: No significant extra-articular osseous abnormalities identified. Other: No supplemental non-categorized findings. IMPRESSION: 1. Osteoarthritis with moderate chondral thinning in the medial compartment and substantial chondral thinning along the posterior patellar ridge and adjacent medial and lateral facets. Small non-fragmented osteochondral lesion along the posterior patellar ridge. 2. Small knee effusion with small Baker's cyst. Electronically Signed   By: Van Clines M.D.   On: 03/11/2021 10:15   I, Julie Rivas, personally (independently) visualized and performed the interpretation of the images attached in this note.   Gelsyn injection 1/3 right knee Procedure: Real-time Ultrasound Guided Injection of right knee superior lateral patellar space Device: Philips Affiniti 50G Images permanently stored and available for review in PACS Verbal informed consent obtained.  Discussed risks and benefits of procedure. Warned about infection bleeding damage to structures skin hypopigmentation and fat atrophy among others. Patient expresses understanding and agreement Time-out conducted.   Noted no overlying erythema, induration, or other signs of local infection.   Skin prepped in a sterile fashion.   Local anesthesia: Topical Ethyl chloride.   With sterile technique and under real time ultrasound guidance: Gelsyn 16.8 mg injected into right knee joint. Fluid seen entering the joint capsule.   Completed without difficulty   Advised to call if fevers/chills, erythema, induration, drainage, or persistent bleeding.   Images permanently stored and available for review in the ultrasound unit.  Impression: Technically successful ultrasound guided injection. Lot #[id number]  Return in 1 week for Gelsyn injection right  knee 2/3      Assessment and Plan: 65 y.o. 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  She does have medial compartment chondromalacia/DJD as well.  However the the worst part of her MRI is the patellofemoral joint which corresponds to her anterior knee pain.  Discussed treatment plan and options.  Plan for hyaluronic acid injection series starting now which already has been authorized and plan for physical therapy to work on quad strengthening.  If this should fail would refer to orthopedic surgery to discuss surgical options.  Reviewed MRI discussed treatment plan and options.   PDMP not reviewed this encounter. Orders Placed This Encounter  Procedures   [place] LIMITED JOINT SPACE STRUCTURES LOW RIGHT(NO LINKED CHARGES)    Standing Status:   Future    Number of Occurrences:   1    Standing Expiration Date:   09/13/2021    Order Specific Question:   Reason for Exam (SYMPTOM  OR DIAGNOSIS REQUIRED)    Answer:   right knee pain    Order Specific Question:   Preferred imaging location?    Answer:   [facility]   Ambulatory referral to Physical Therapy    Referral Priority:   Routine    Referral Type:   Physical Medicine    Referral Reason:   Specialty Services Required    Requested Specialty:   Physical Therapy    Number of Visits Requested:   1   No orders of the defined types were placed in this encounter.    Discussed warning signs or symptoms. Please see discharge instructions. Patient expresses understanding.   The above documentation has been reviewed and is accurate and complete Julie Rivas, M.D.

## 2021-03-12 NOTE — Telephone Encounter (Signed)
Note is already documented

## 2021-03-13 ENCOUNTER — Ambulatory Visit (INDEPENDENT_AMBULATORY_CARE_PROVIDER_SITE_OTHER): Payer: Medicare Other

## 2021-03-13 ENCOUNTER — Other Ambulatory Visit: Payer: Self-pay

## 2021-03-13 ENCOUNTER — Ambulatory Visit (INDEPENDENT_AMBULATORY_CARE_PROVIDER_SITE_OTHER): Payer: Medicare Other | Admitting: Family Medicine

## 2021-03-13 ENCOUNTER — Ambulatory Visit: Payer: Self-pay

## 2021-03-13 VITALS — BP 134/86 | HR 90 | Ht 62.0 in | Wt 145.6 lb

## 2021-03-13 DIAGNOSIS — G8929 Other chronic pain: Secondary | ICD-10-CM

## 2021-03-13 DIAGNOSIS — E538 Deficiency of other specified B group vitamins: Secondary | ICD-10-CM | POA: Diagnosis not present

## 2021-03-13 DIAGNOSIS — M25561 Pain in right knee: Secondary | ICD-10-CM

## 2021-03-13 DIAGNOSIS — G63 Polyneuropathy in diseases classified elsewhere: Secondary | ICD-10-CM | POA: Diagnosis not present

## 2021-03-13 DIAGNOSIS — M222X1 Patellofemoral disorders, right knee: Secondary | ICD-10-CM

## 2021-03-13 MED ORDER — CYANOCOBALAMIN 1000 MCG/ML IJ SOLN
1000.0000 ug | INTRAMUSCULAR | Status: AC
  Administered 2021-03-13 – 2021-04-21 (×2): 1000 ug via INTRAMUSCULAR

## 2021-03-13 NOTE — Patient Instructions (Addendum)
Thank you for coming in today.   Plan to start the gel shots now.   Schedule next week and the week after for shots 2 and 3.    I've referred you to Physical Therapy.  Let us know if you don't hear from them in one week.

## 2021-03-13 NOTE — Progress Notes (Signed)
Pt here for monthly B12 injection per Dr Ronnald Ramp.  B12 1060mcg given IM in right deltoid and pt tolerated injection well.  Next B12 injection scheduled for 04/21/21 during OV.

## 2021-03-16 ENCOUNTER — Other Ambulatory Visit: Payer: Self-pay | Admitting: Internal Medicine

## 2021-03-16 DIAGNOSIS — F411 Generalized anxiety disorder: Secondary | ICD-10-CM

## 2021-03-20 ENCOUNTER — Ambulatory Visit: Payer: Medicare Other | Admitting: Family Medicine

## 2021-03-21 ENCOUNTER — Ambulatory Visit (INDEPENDENT_AMBULATORY_CARE_PROVIDER_SITE_OTHER): Payer: Medicare Other | Admitting: Family Medicine

## 2021-03-21 ENCOUNTER — Other Ambulatory Visit: Payer: Self-pay

## 2021-03-21 ENCOUNTER — Ambulatory Visit: Payer: Self-pay

## 2021-03-21 DIAGNOSIS — M1711 Unilateral primary osteoarthritis, right knee: Secondary | ICD-10-CM | POA: Diagnosis not present

## 2021-03-21 DIAGNOSIS — M25561 Pain in right knee: Secondary | ICD-10-CM

## 2021-03-21 DIAGNOSIS — G8929 Other chronic pain: Secondary | ICD-10-CM

## 2021-03-21 NOTE — Progress Notes (Signed)
Julie Rivas presents to clinic for Gelsyn injection right knee 2/3  Procedure: Real-time Ultrasound Guided Injection of right knee superior lateral patellar space  Device: Philips Affiniti 50G Images permanently stored and available for review in PACS Verbal informed consent obtained.  Discussed risks and benefits of procedure. Warned about infection bleeding damage to structures skin hypopigmentation and fat atrophy among others. Patient expresses understanding and agreement Time-out conducted.   Noted no overlying erythema, induration, or other signs of local infection.   Skin prepped in a sterile fashion.   Local anesthesia: Topical Ethyl chloride.   With sterile technique and under real time ultrasound guidance:  Gelsyn 16.8mg  injected into knee joint. Fluid seen entering the joint capsule.   Completed without difficulty   Advised to call if fevers/chills, erythema, induration, drainage, or persistent bleeding.   Images permanently stored and available for review in the ultrasound unit.  Impression: Technically successful ultrasound guided injection.   Lot number: [id number]  Recheck in 1 week

## 2021-03-21 NOTE — Patient Instructions (Signed)
Good to see you today.  You had a R knee Gelsyn injection (2/3).  Call or go to the ER if you develop a large red swollen joint with extreme pain or oozing puss.   Please make sure you have another appointment scheduled next week for the 3rd injection in the series.

## 2021-03-27 ENCOUNTER — Ambulatory Visit: Payer: Medicare Other | Admitting: Family Medicine

## 2021-03-27 ENCOUNTER — Ambulatory Visit (INDEPENDENT_AMBULATORY_CARE_PROVIDER_SITE_OTHER): Payer: Medicare Other | Admitting: Family Medicine

## 2021-03-27 ENCOUNTER — Encounter: Payer: Self-pay | Admitting: Family Medicine

## 2021-03-27 ENCOUNTER — Other Ambulatory Visit: Payer: Self-pay

## 2021-03-27 DIAGNOSIS — M1711 Unilateral primary osteoarthritis, right knee: Secondary | ICD-10-CM

## 2021-03-27 DIAGNOSIS — G8929 Other chronic pain: Secondary | ICD-10-CM

## 2021-03-27 NOTE — Patient Instructions (Signed)
Good to see you Can take a month to improve Follow up with Dr. Georgina Snell as needed

## 2021-03-27 NOTE — Assessment & Plan Note (Signed)
Third and final viscosupplementation given today.  Patient will follow up with [facility] medicine provider if necessary.  Patient warned of any type of swelling or redness to seek medical attention immediately.  Otherwise follow-up as needed.

## 2021-03-27 NOTE — Progress Notes (Signed)
[place] [address] [place] Phone: [phone] Subjective:   I [name] am serving as a Education administrator for Dr. Hulan Saas.  This visit occurred during the SARS-CoV-2 public health emergency.  Safety protocols were in place, including screening questions prior to the visit, additional usage of staff PPE, and extensive cleaning of exam room while observing appropriate contact time as indicated for disinfecting solutions.   I'm seeing this patient by the request  of:  Janith Lima, MD  CC: Right knee pain follow-up  [id number]  Julie Rivas is a 65 y.o. female coming in with complaint of right knee pain. Here today for Gelsyn #3. Patient states the knee is doing ok.  Has noticed improvement and is sleeping more comfortably.      Past Medical History:  Diagnosis Date   Allergy    Anxiety    Arthritis    Asthma    Cataract    REMOVED BOTH EYES    Hyperlipidemia    Hypertension    Past Surgical History:  Procedure Laterality Date   COLONOSCOPY     ECTOPIC PREGNANCY SURGERY     has partial right fallopian tube   POLYPECTOMY     Social History   Socioeconomic History   Marital status: Married    Spouse name: Not on file   Number of children: Not on file   Years of education: Not on file   Highest education level: Not on file  Occupational History   Not on file  Tobacco Use   Smoking status: Never   Smokeless tobacco: Never  Substance and Sexual Activity   Alcohol use: No   Drug use: No   Sexual activity: Yes    Birth control/protection: Surgical  Other Topics Concern   Not on file  Social History Narrative   Not on file   Social Determinants of Health   Financial Resource Strain: Not on file  Food Insecurity: Not on file  Transportation Needs: Not on file  Physical Activity: Not on file  Stress: Not on file  Social Connections: Not on file   Allergies  Allergen Reactions   Codeine Shortness  Of Breath and Swelling   Family History  Problem Relation Age of Onset   Heart failure Mother    Arthritis Mother    Cancer Father    Hypertension Father    Diabetes Sister    Colon cancer Neg Hx    Esophageal cancer Neg Hx    Stomach cancer Neg Hx    Rectal cancer Neg Hx    Hyperlipidemia Neg Hx    Heart disease Neg Hx    Early death Neg Hx    Depression Neg Hx    Colon polyps Neg Hx       Current Outpatient Medications (Cardiovascular):    carvedilol (COREG) 3.125 MG tablet, Take 1 tablet (3.125 mg total) by mouth 2 (two) times daily with a meal.   rosuvastatin (CRESTOR) 20 MG tablet, Take 1 tablet (20 mg total) by mouth daily.   Current Outpatient Medications (Respiratory):    albuterol (PROVENTIL) (2.5 MG/3ML) 0.083% nebulizer solution, Take 3 mLs (2.5 mg total) by nebulization every 6 (six) hours as needed for wheezing or shortness of breath.      Current Facility-Administered Medications (Hematological):    cyanocobalamin ((VITAMIN B-12)) injection 1,000 mcg  Current Outpatient Medications (Other):    BESIVANCE 0.6 % SUSP, Place 1 drop into the left eye  3 (three) times daily.   clonazePAM (KLONOPIN) 0.5 MG tablet, TAKE 1 TABLET BY MOUTH THREE TIMES DAILY AS NEEDED FOR ANXIETY   cyclobenzaprine (FLEXERIL) 10 MG tablet, TAKE ONE TABLET BY MOUTH UP TO THREE TIMES DAILY   Difluprednate 0.05 % EMUL, Apply to eye.   gabapentin (NEURONTIN) 300 MG capsule, Take 1 capsule by mouth at bedtime   gatifloxacin (ZYMAXID) 0.5 % SOLN, SMARTSIG:In Eye(s)   potassium chloride SA (KLOR-CON) 20 MEQ tablet, Take 1 tablet (20 mEq total) by mouth 2 (two) times daily.   PROLENSA 0.07 % SOLN, Place 1 drop into the left eye at bedtime.    Reviewed prior external information including notes and imaging from  primary care provider As well as notes that were available from care everywhere and other healthcare systems.  Past medical history, social, surgical and family history all  reviewed in electronic medical record.  No pertanent information unless stated regarding to the chief complaint.   Review of Systems:  No headache, visual changes, nausea, vomiting, diarrhea, constipation, dizziness, abdominal pain, skin rash, fevers, chills, night sweats, weight loss, swollen lymph nodes, body aches, joint swelling, chest pain, shortness of breath, mood changes. POSITIVE muscle aches  Objective  Blood pressure 124/90, pulse 97, height 5\' 2"  (1.575 m), weight 144 lb (65.3 kg), SpO2 96 %.   General: No apparent distress alert and oriented x3 mood and affect normal, dressed appropriately.   After informed written and verbal consent, patient was seated on exam table. Right knee was prepped with alcohol swab and utilizing anterolateral approach, patient's right knee space was injected with16.8 mg/2.5 mL of Gelsyn (sodium hyaluronate) in a prefilled syringe was injected easily into the knee through a 22-gauge needle..Patient tolerated the procedure well without immediate complications.    Impression and Recommendations:    The above documentation has been reviewed and is accurate and complete Lyndal Pulley, DO

## 2021-04-21 ENCOUNTER — Ambulatory Visit (INDEPENDENT_AMBULATORY_CARE_PROVIDER_SITE_OTHER): Payer: Medicare Other | Admitting: Internal Medicine

## 2021-04-21 ENCOUNTER — Other Ambulatory Visit: Payer: Self-pay

## 2021-04-21 ENCOUNTER — Ambulatory Visit: Payer: Medicare Other

## 2021-04-21 ENCOUNTER — Encounter: Payer: Self-pay | Admitting: Internal Medicine

## 2021-04-21 VITALS — BP 134/84 | HR 86 | Temp 98.6°F | Ht 62.0 in | Wt 144.0 lb

## 2021-04-21 DIAGNOSIS — T502X5A Adverse effect of carbonic-anhydrase inhibitors, benzothiadiazides and other diuretics, initial encounter: Secondary | ICD-10-CM | POA: Diagnosis not present

## 2021-04-21 DIAGNOSIS — R7303 Prediabetes: Secondary | ICD-10-CM

## 2021-04-21 DIAGNOSIS — E538 Deficiency of other specified B group vitamins: Secondary | ICD-10-CM | POA: Diagnosis not present

## 2021-04-21 DIAGNOSIS — R059 Cough, unspecified: Secondary | ICD-10-CM | POA: Insufficient documentation

## 2021-04-21 DIAGNOSIS — N1832 Chronic kidney disease, stage 3b: Secondary | ICD-10-CM | POA: Diagnosis not present

## 2021-04-21 DIAGNOSIS — G63 Polyneuropathy in diseases classified elsewhere: Secondary | ICD-10-CM | POA: Diagnosis not present

## 2021-04-21 DIAGNOSIS — E785 Hyperlipidemia, unspecified: Secondary | ICD-10-CM

## 2021-04-21 DIAGNOSIS — R1319 Other dysphagia: Secondary | ICD-10-CM | POA: Diagnosis not present

## 2021-04-21 DIAGNOSIS — E876 Hypokalemia: Secondary | ICD-10-CM | POA: Diagnosis not present

## 2021-04-21 DIAGNOSIS — I10 Essential (primary) hypertension: Secondary | ICD-10-CM

## 2021-04-21 MED ORDER — CYANOCOBALAMIN 1000 MCG/ML IJ SOLN: 1000.0000 ug | Freq: Once | INTRAMUSCULAR | Status: DC

## 2021-04-21 NOTE — Patient Instructions (Signed)

## 2021-04-21 NOTE — Progress Notes (Signed)
Julie Rivas   Subjective:  Patient ID: Julie Rivas, female    DOB: 1956/05/06  Age: 65 y.o. MRN: [id number]  CC: Hypertension, Cough, and Hyperlipidemia  This visit occurred during the SARS-CoV-2 public health emergency.  Safety protocols were in place, including screening questions prior to the visit, additional usage of staff PPE, and extensive cleaning of exam room while observing appropriate contact time as indicated for disinfecting solutions.    HPI Julie Rivas presents for f/up -  She complains of a 110-monthhistory of nocturnal, nonproductive cough and the new onset of dysphagia, feeling like food gets stuck in her esophagus.  She denies odynophagia, chest pain, loss of appetite, weight loss, melena, or bright red blood per rectum.  She does not experience heartburn.  Outpatient Medications Prior to Visit  Medication Sig Dispense Refill   albuterol (PROVENTIL) (2.5 MG/3ML) 0.083% nebulizer solution Take 3 mLs (2.5 mg total) by nebulization every 6 (six) hours as needed for wheezing or shortness of breath. 150 mL 2   BESIVANCE 0.6 % SUSP Place 1 drop into the left eye 3 (three) times daily.     carvedilol (COREG) 3.125 MG tablet Take 1 tablet (3.125 mg total) by mouth 2 (two) times daily with a meal. 180 tablet 1   clonazePAM (KLONOPIN) 0.5 MG tablet TAKE 1 TABLET BY MOUTH THREE TIMES DAILY AS NEEDED FOR ANXIETY 90 tablet 0   cyclobenzaprine (FLEXERIL) 10 MG tablet TAKE ONE TABLET BY MOUTH UP TO THREE TIMES DAILY 90 tablet 0   Difluprednate 0.05 % EMUL Apply to eye.     gabapentin (NEURONTIN) 300 MG capsule Take 1 capsule by mouth at bedtime 90 capsule 0   gatifloxacin (ZYMAXID) 0.5 % SOLN SMARTSIG:In Eye(s)     PROLENSA 0.07 % SOLN Place 1 drop into the left eye at bedtime.     potassium chloride SA (KLOR-CON) 20 MEQ tablet Take 1 tablet (20 mEq total) by mouth 2 (two) times daily. 180 tablet 0   rosuvastatin (CRESTOR) 20 MG tablet Take 1 tablet (20 mg total) by mouth daily. 90 tablet  1   cyanocobalamin ((VITAMIN B-12)) injection 1,000 mcg      No facility-administered medications prior to visit.    ROS Review of Systems  Constitutional:  Negative for chills, diaphoresis, fatigue and fever.  HENT:  Positive for trouble swallowing. Negative for sore throat and voice change.   Respiratory:  Positive for cough. Negative for chest tightness, shortness of breath and wheezing.   Cardiovascular:  Negative for chest pain, palpitations and leg swelling.  Gastrointestinal:  Negative for abdominal pain, constipation, diarrhea, nausea and vomiting.  Genitourinary:  Negative for difficulty urinating.  Musculoskeletal: Negative.   Skin: Negative.  Negative for wound.  Neurological:  Negative for dizziness, weakness and headaches.  Hematological:  Negative for adenopathy. Does not bruise/bleed easily.  Psychiatric/Behavioral: Negative.     Objective:  BP 134/84 (BP Location: Right Arm, Patient Position: Sitting, Cuff Size: Large)   Pulse 86   Temp 98.6 F (37 C) (Oral)   Ht '5\' 2"'$  (1.575 m)   Wt 144 lb (65.3 kg)   SpO2 96%   BMI 26.34 kg/m   BP Readings from Last 3 Encounters:  04/21/21 134/84  03/27/21 124/90  03/13/21 134/86    Wt Readings from Last 3 Encounters:  04/21/21 144 lb (65.3 kg)  03/27/21 144 lb (65.3 kg)  03/13/21 145 lb 9.6 oz (66 kg)    Physical Exam Vitals reviewed.  HENT:  Nose: Nose normal.     Mouth/Throat:     Mouth: Mucous membranes are moist.  Eyes:     Conjunctiva/sclera: Conjunctivae normal.  Cardiovascular:     Rate and Rhythm: Normal rate and regular rhythm.     Heart sounds: No murmur heard. Pulmonary:     Effort: Pulmonary effort is normal.     Breath sounds: No stridor. No wheezing, rhonchi or rales.  Abdominal:     General: Abdomen is flat. Bowel sounds are normal. There is no distension.     Palpations: Abdomen is soft. There is no hepatomegaly, splenomegaly or mass.     Tenderness: There is no abdominal tenderness.  There is no guarding.  Musculoskeletal:        General: Normal range of motion.     Cervical back: Neck supple.     Right lower leg: No edema.     Left lower leg: No edema.  Lymphadenopathy:     Cervical: No cervical adenopathy.  Skin:    General: Skin is warm and dry.  Neurological:     General: No focal deficit present.     Mental Status: She is alert.  Psychiatric:        Behavior: Behavior normal.    Lab Results  Component Value Date   WBC 5.8 04/21/2021   HGB 13.1 04/21/2021   HCT 38.3 04/21/2021   PLT 256.0 04/21/2021   GLUCOSE 108 (H) 04/21/2021   CHOL 231 (H) 10/31/2020   TRIG 138.0 10/31/2020   HDL 45.50 10/31/2020   LDLCALC 158 (H) 10/31/2020   ALT 13 07/10/2020   AST 16 07/10/2020   NA 142 04/21/2021   K 3.4 (L) 04/21/2021   CL 105 04/21/2021   CREATININE 0.82 04/21/2021   BUN 17 04/21/2021   CO2 30 04/21/2021   TSH 1.18 07/10/2020   PSA WNL 10/30/2015   INR 1.1 (H) 12/31/2015   HGBA1C 6.1 04/21/2021    MM 3D SCREEN BREAST BILATERAL  Result Date: 08/29/2020 CLINICAL DATA:  Screening. EXAM: DIGITAL SCREENING BILATERAL MAMMOGRAM WITH TOMO AND CAD COMPARISON:  Previous exam(s). ACR Breast Density Category c: The breast tissue is heterogeneously dense, which may obscure small masses. FINDINGS: There are no findings suspicious for malignancy. Images were processed with CAD. IMPRESSION: No mammographic evidence of malignancy. A result letter of this screening mammogram will be mailed directly to the patient. RECOMMENDATION: Screening mammogram in one year. (Code:SM-B-01Y) BI-RADS CATEGORY  1: Negative. Electronically Signed   By: Lovey Newcomer M.D.   On: 08/29/2020 12:07   DG Chest 2 View  Result Date: 04/22/2021 CLINICAL DATA:  Cough, difficulty swallowing EXAM: CHEST - 2 VIEW COMPARISON:  01/08/2020 FINDINGS: The heart size and mediastinal contours are within normal limits. Both lungs are clear. The visualized skeletal structures are unremarkable. IMPRESSION:  Normal study. Electronically Signed   By: Rolm Baptise M.D.   On: 04/22/2021 11:20     Assessment & Plan:   Julie Rivas was seen today for hypertension, cough and hyperlipidemia.  Diagnoses and all orders for this visit:  Essential hypertension- Her blood pressure is well controlled. -     Basic metabolic panel; Future -     CBC with Differential/Platelet; Future -     CBC with Differential/Platelet -     Basic metabolic panel -     potassium chloride SA (KLOR-CON) 20 MEQ tablet; Take 1 tablet (20 mEq total) by mouth 2 (two) times daily.  Vitamin B12 deficiency neuropathy ([place]) -  Folate; Future -     CBC with Differential/Platelet; Future -     Discontinue: cyanocobalamin ((VITAMIN B-12)) injection 1,000 mcg -     CBC with Differential/Platelet -     Folate  Stage 3b chronic kidney disease ([place])- Her renal function has improved slightly. -     Basic metabolic panel; Future -     Urinalysis, Routine w reflex microscopic; Future -     CBC with Differential/Platelet; Future -     CBC with Differential/Platelet -     Urinalysis, Routine w reflex microscopic -     Basic metabolic panel  Prediabetes -     Basic metabolic panel; Future -     Hemoglobin A1c; Future -     Hemoglobin A1c -     Basic metabolic panel  Cough-her chest x-ray is reassuring. -     Ambulatory referral to Gastroenterology  Esophageal dysphagia- I recommended that she see GI to consider undergoing upper endoscopy. -     DG Chest 2 View; Future  Diuretic-induced hypokalemia -     potassium chloride SA (KLOR-CON) 20 MEQ tablet; Take 1 tablet (20 mEq total) by mouth 2 (two) times daily.  Hyperlipidemia with target LDL less than 130 -     rosuvastatin (CRESTOR) 20 MG tablet; Take 1 tablet (20 mg total) by mouth daily.  I am having Julie Rivas maintain her gabapentin, carvedilol, albuterol, Besivance, Prolensa, Difluprednate, gatifloxacin, cyclobenzaprine, clonazePAM, potassium chloride SA, and  rosuvastatin. We administered cyanocobalamin.  Meds ordered this encounter  Medications   DISCONTD: cyanocobalamin ((VITAMIN B-12)) injection 1,000 mcg   potassium chloride SA (KLOR-CON) 20 MEQ tablet    Sig: Take 1 tablet (20 mEq total) by mouth 2 (two) times daily.    Dispense:  180 tablet    Refill:  0   rosuvastatin (CRESTOR) 20 MG tablet    Sig: Take 1 tablet (20 mg total) by mouth daily.    Dispense:  90 tablet    Refill:  1     Follow-up: Return in about 3 months (around 07/22/2021).  Scarlette Calico, MD

## 2021-04-22 ENCOUNTER — Other Ambulatory Visit: Payer: Medicare Other

## 2021-04-22 ENCOUNTER — Ambulatory Visit (INDEPENDENT_AMBULATORY_CARE_PROVIDER_SITE_OTHER): Payer: Medicare Other

## 2021-04-22 DIAGNOSIS — R1319 Other dysphagia: Secondary | ICD-10-CM | POA: Diagnosis not present

## 2021-04-22 DIAGNOSIS — R059 Cough, unspecified: Secondary | ICD-10-CM | POA: Diagnosis not present

## 2021-04-22 DIAGNOSIS — R1310 Dysphagia, unspecified: Secondary | ICD-10-CM | POA: Diagnosis not present

## 2021-04-22 LAB — CBC WITH DIFFERENTIAL/PLATELET
Basophils Absolute: 0 10*3/uL (ref 0.0–0.1)
Basophils Relative: 0.7 % (ref 0.0–3.0)
Eosinophils Absolute: 0.2 10*3/uL (ref 0.0–0.7)
Eosinophils Relative: 2.8 % (ref 0.0–5.0)
HCT: 38.3 % (ref 36.0–46.0)
Hemoglobin: 13.1 g/dL (ref 12.0–15.0)
Lymphocytes Relative: 41 % (ref 12.0–46.0)
Lymphs Abs: 2.4 10*3/uL (ref 0.7–4.0)
MCHC: 34.1 g/dL (ref 30.0–36.0)
MCV: 93.5 fl (ref 78.0–100.0)
Monocytes Absolute: 0.5 10*3/uL (ref 0.1–1.0)
Monocytes Relative: 7.9 % (ref 3.0–12.0)
Neutro Abs: 2.8 10*3/uL (ref 1.4–7.7)
Neutrophils Relative %: 47.6 % (ref 43.0–77.0)
Platelets: 256 10*3/uL (ref 150.0–400.0)
RBC: 4.09 Mil/uL (ref 3.87–5.11)
RDW: 12.9 % (ref 11.5–15.5)
WBC: 5.8 10*3/uL (ref 4.0–10.5)

## 2021-04-22 LAB — URINALYSIS, ROUTINE W REFLEX MICROSCOPIC
Bilirubin Urine: NEGATIVE
Leukocytes,Ua: NEGATIVE
Nitrite: NEGATIVE
Specific Gravity, Urine: 1.02 (ref 1.000–1.030)
Total Protein, Urine: NEGATIVE
Urine Glucose: NEGATIVE
Urobilinogen, UA: 1 (ref 0.0–1.0)
pH: 6.5 (ref 5.0–8.0)

## 2021-04-22 LAB — BASIC METABOLIC PANEL
BUN: 17 mg/dL (ref 6–23)
CO2: 30 mEq/L (ref 19–32)
Calcium: 9.2 mg/dL (ref 8.4–10.5)
Chloride: 105 mEq/L (ref 96–112)
Creatinine, Ser: 0.82 mg/dL (ref 0.40–1.20)
GFR: 74.13 mL/min (ref >60.00)
Glucose, Bld: 108 mg/dL — ABNORMAL HIGH (ref 70–99)
Potassium: 3.4 mEq/L — ABNORMAL LOW (ref 3.5–5.1)
Sodium: 142 mEq/L (ref 135–145)

## 2021-04-22 LAB — FOLATE: Folate: 7.1 ng/mL (ref >5.9)

## 2021-04-22 LAB — HEMOGLOBIN A1C: Hgb A1c MFr Bld: 6.1 % (ref 4.6–6.5)

## 2021-04-22 MED ORDER — ROSUVASTATIN CALCIUM 20 MG PO TABS: 20.0000 mg | ORAL_TABLET | Freq: Every day | ORAL | 1 refills | Status: DC

## 2021-04-22 MED ORDER — POTASSIUM CHLORIDE CRYS ER 20 MEQ PO TBCR: 20.0000 meq | EXTENDED_RELEASE_TABLET | Freq: Two times a day (BID) | ORAL | 0 refills | Status: DC

## 2021-04-25 ENCOUNTER — Telehealth: Payer: Self-pay | Admitting: Internal Medicine

## 2021-04-25 NOTE — Telephone Encounter (Signed)
Called patient to schedule AWV.   Patient has questions about prescription, she thinks it is the potassium tablet.  She states she can not swallow them and there is no way to cut them. I advised patient I would send a message to nurse and have someone call her in regards to this. Please call patient, thanks.

## 2021-04-28 ENCOUNTER — Telehealth: Payer: Self-pay | Admitting: Internal Medicine

## 2021-04-28 NOTE — Telephone Encounter (Signed)
Pt is unable to to swallow the potassium chloride SA (KLOR-CON) 20 MEQ tablet. State she hasn't taken anything for her BP so it has been high.   Requesting a callback to discuss further options of treatment.

## 2021-04-29 ENCOUNTER — Other Ambulatory Visit: Payer: Self-pay | Admitting: Internal Medicine

## 2021-04-29 DIAGNOSIS — I10 Essential (primary) hypertension: Secondary | ICD-10-CM

## 2021-04-29 DIAGNOSIS — E876 Hypokalemia: Secondary | ICD-10-CM

## 2021-04-29 DIAGNOSIS — T502X5A Adverse effect of carbonic-anhydrase inhibitors, benzothiadiazides and other diuretics, initial encounter: Secondary | ICD-10-CM

## 2021-04-29 MED ORDER — SPIRONOLACTONE 50 MG PO TABS: 50.0000 mg | ORAL_TABLET | Freq: Every day | ORAL | 0 refills | Status: DC

## 2021-04-29 NOTE — Telephone Encounter (Signed)
Please advise on any recommendations

## 2021-04-30 NOTE — Telephone Encounter (Signed)
Patient has been notified and verbalizes understanding ?

## 2021-05-01 ENCOUNTER — Other Ambulatory Visit: Payer: Self-pay

## 2021-05-01 NOTE — Progress Notes (Signed)
This encounter was created in error - please disregard.

## 2021-05-01 NOTE — Telephone Encounter (Signed)
Lovena Le, CMA has discussed with pt per 8/22 phone note.

## 2021-06-11 IMAGING — MG DIGITAL SCREENING BILAT W/ TOMO W/ CAD
8 series · 9 of 24 positions shown · non-contrast
Comparison: Previous exam(s).

CLINICAL DATA: Screening.

EXAM:
DIGITAL SCREENING BILATERAL MAMMOGRAM WITH TOMO AND CAD

[R MLO synth-2D]
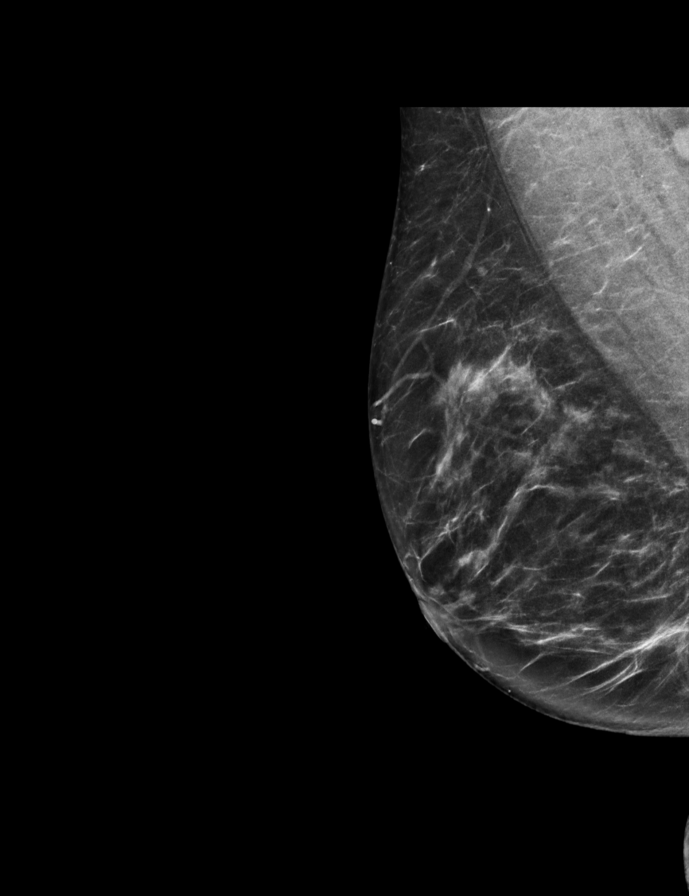

[L CC synth-2D]
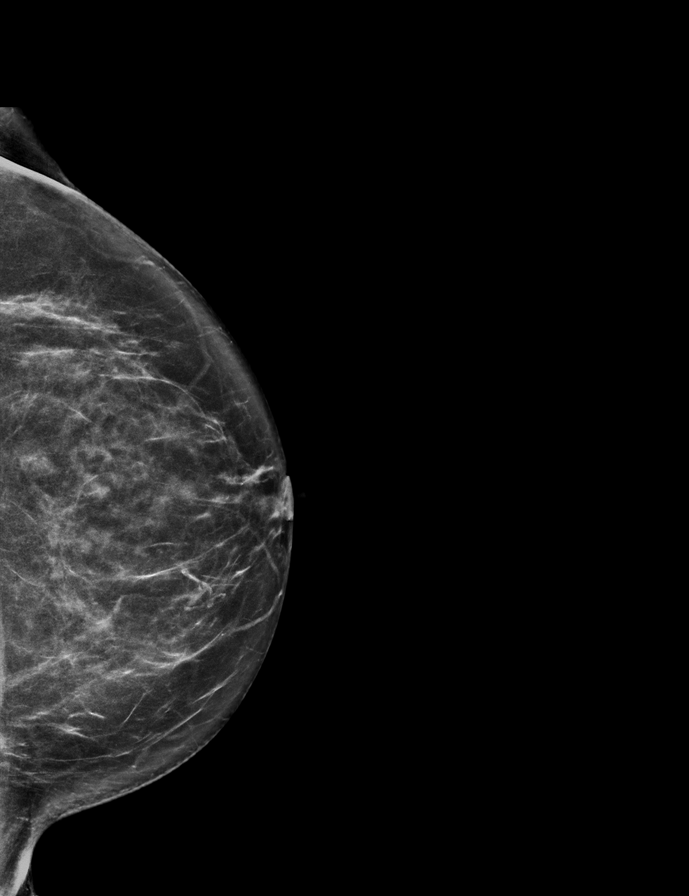

[L MLO synth-2D]
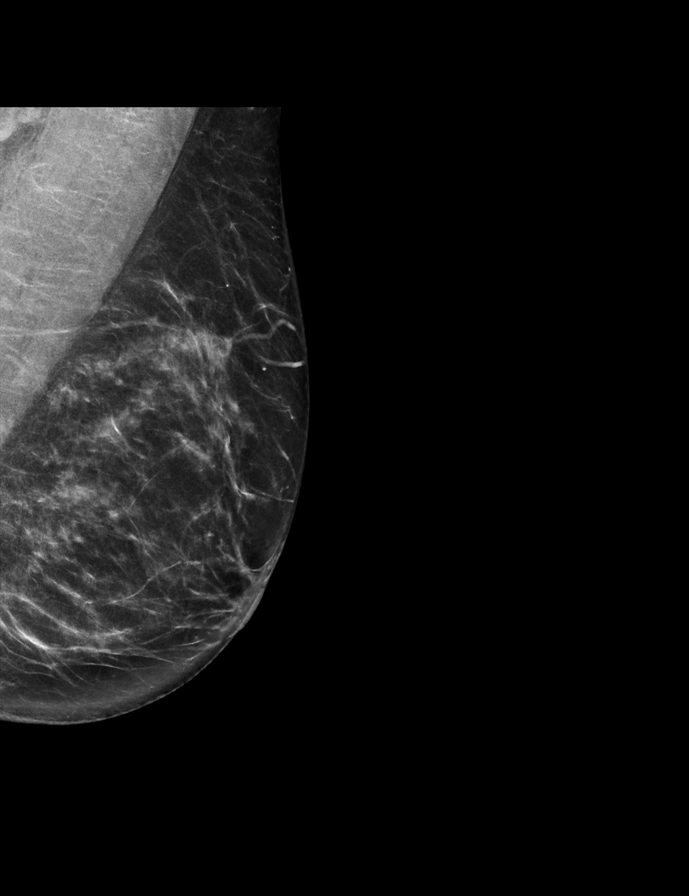

[R CC synth-2D]
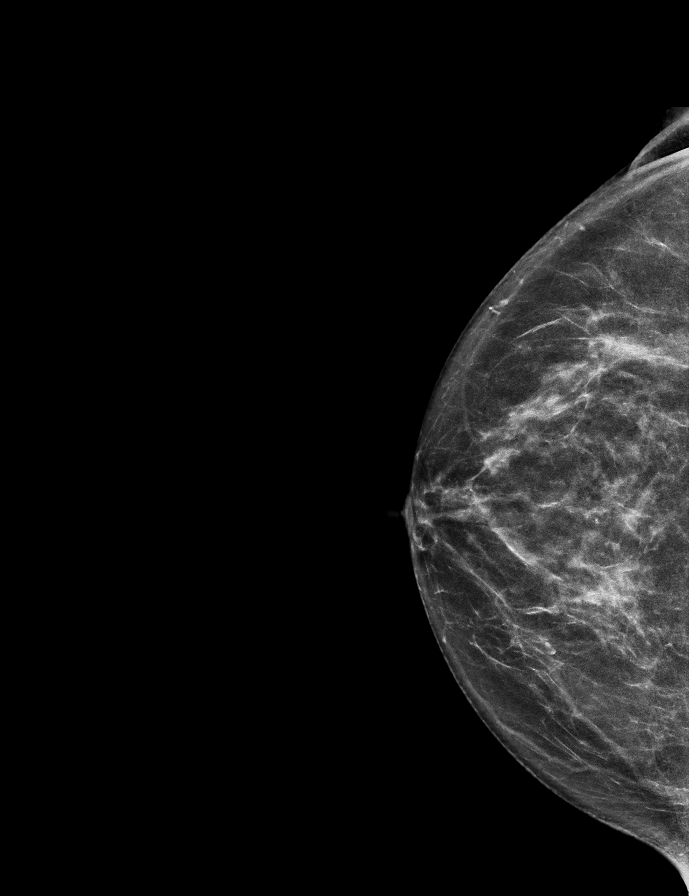

[L CC tomo · 2 of 63 frames shown]
[frame 21/63]
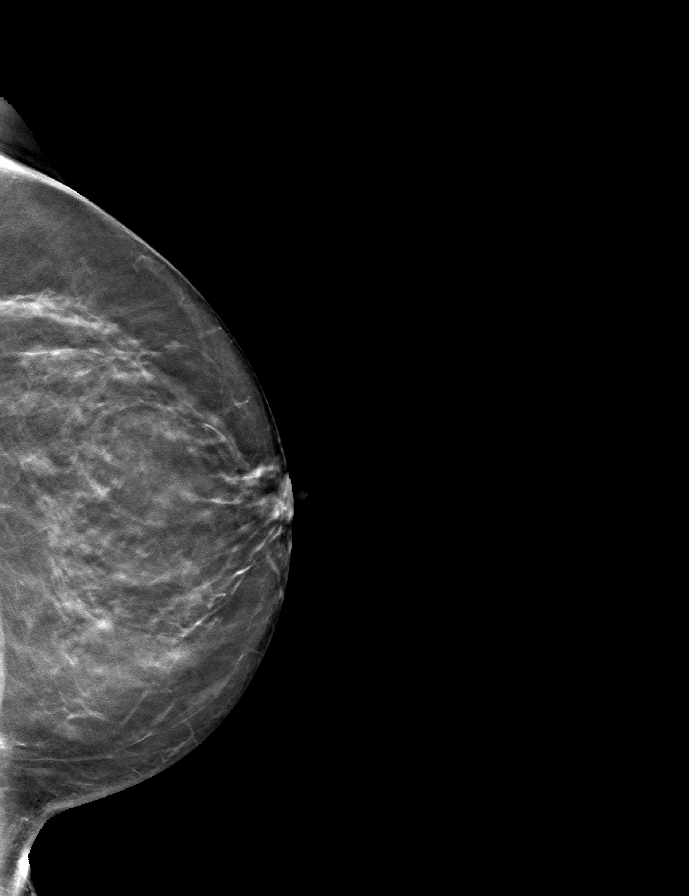
[frame 32/63]
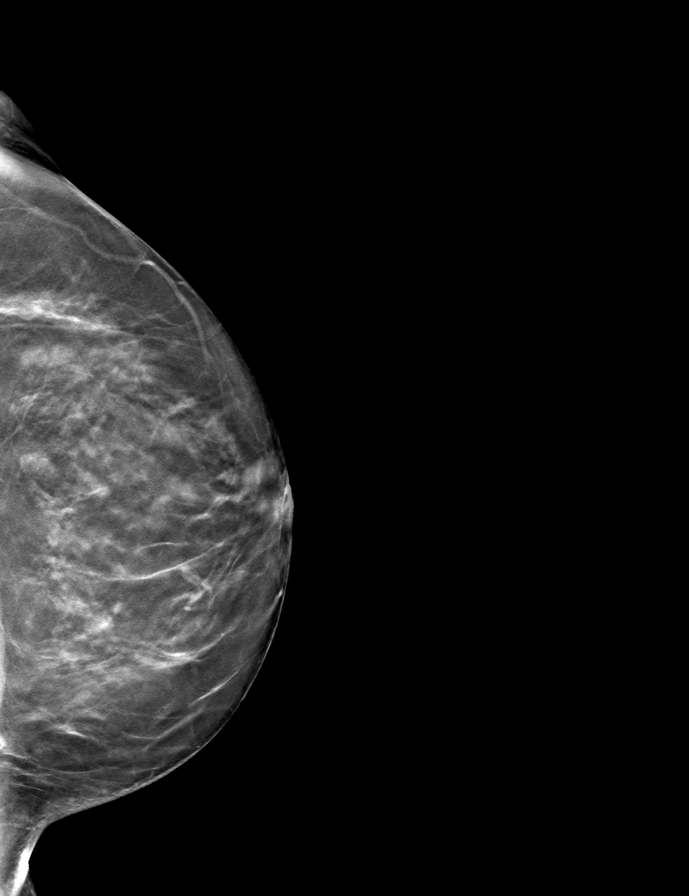

[R CC tomo · tomo slice 31/61.0]
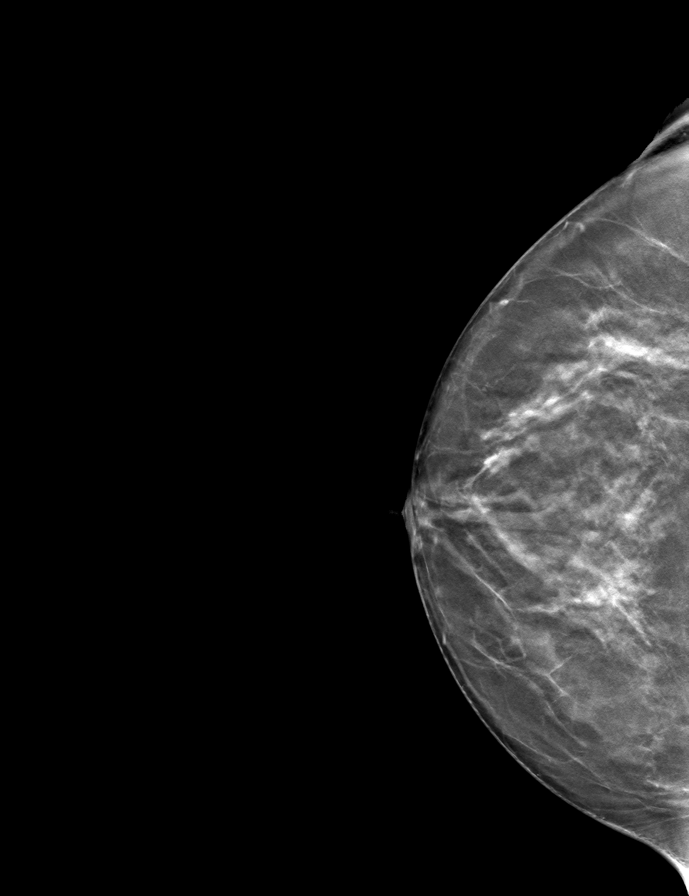

[R MLO tomo · tomo slice 33/64.0]
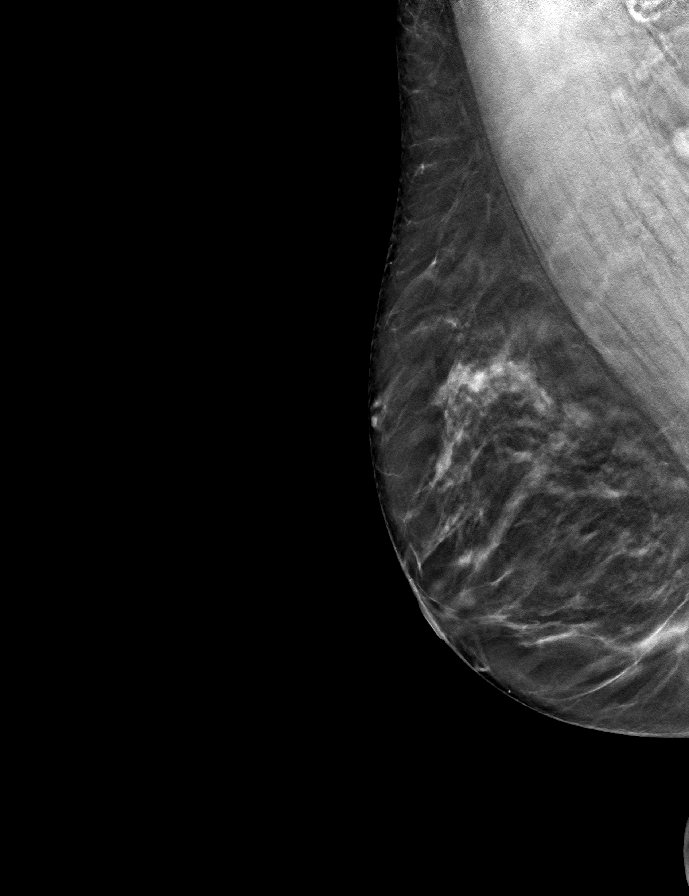

[L MLO tomo · tomo slice 34/67.0]
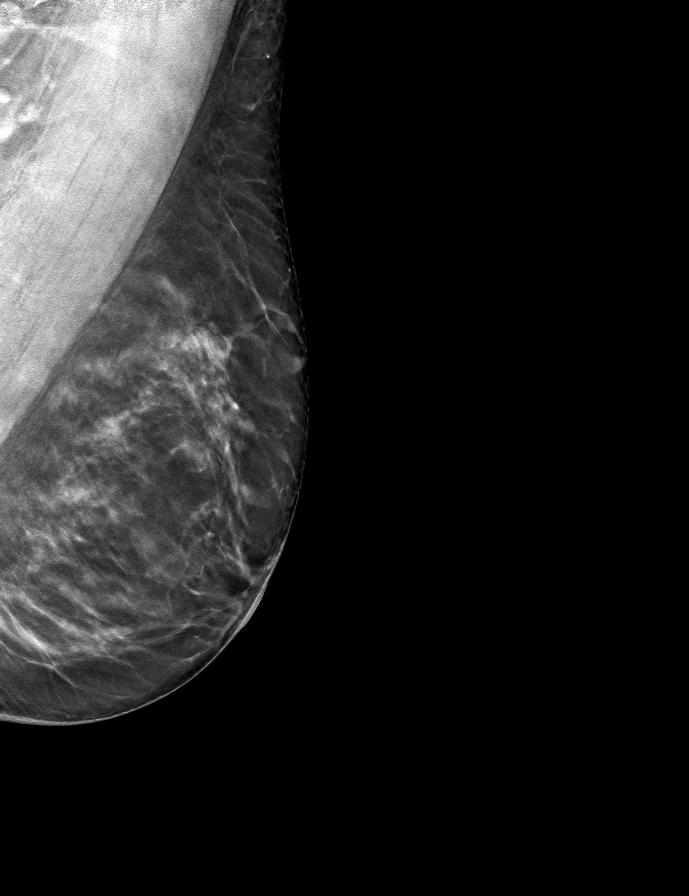

[9 of 24 positions shown; findings below may reference images not displayed]

ACR Breast Density Category c: The breast tissue is heterogeneously
dense, which may obscure small masses.
FINDINGS: There are no findings suspicious for malignancy. Images were
processed with CAD.
IMPRESSION: No mammographic evidence of malignancy. A result letter of this
screening mammogram will be mailed directly to the patient.

RECOMMENDATION:
Screening mammogram in one year. (Code:FT-U-LHB)

BI-RADS CATEGORY  1: Negative.

## 2021-07-01 ENCOUNTER — Telehealth: Payer: Self-pay | Admitting: Internal Medicine

## 2021-07-01 NOTE — Telephone Encounter (Signed)
Patient called stating she is light headed, short of breath, and heart racing x2w  I informed patient Dr. Ronnald Ramp first appt opening was 08-07-2021  I offered patient another appt sooner w/ another provider, patient declined  I offered to transfer patient to [facility], patient declined  Patient stated she only wanted to see Dr. Ronnald Ramp  Patient's next appt is 08-07-2021

## 2021-07-02 NOTE — Telephone Encounter (Signed)
Called pt, LVM with pt's husband to call the office back. Husband stated that she was not home at that time.

## 2021-07-02 NOTE — Telephone Encounter (Signed)
Pt. Has called back and states she is no longer feeling ill. Advised pt. To Schedule an office visit as provider suggested. Pt. Denies appt. States she will call back, should she begin to feel ill.    Callback #- [phone]

## 2021-07-16 ENCOUNTER — Ambulatory Visit (INDEPENDENT_AMBULATORY_CARE_PROVIDER_SITE_OTHER): Payer: Medicare Other

## 2021-07-16 ENCOUNTER — Ambulatory Visit (INDEPENDENT_AMBULATORY_CARE_PROVIDER_SITE_OTHER): Payer: Medicare Other | Admitting: Internal Medicine

## 2021-07-16 ENCOUNTER — Other Ambulatory Visit: Payer: Self-pay

## 2021-07-16 ENCOUNTER — Encounter: Payer: Self-pay | Admitting: Internal Medicine

## 2021-07-16 VITALS — BP 128/78 | HR 100 | Temp 98.1°F | Resp 16 | Ht 62.0 in | Wt 146.0 lb

## 2021-07-16 DIAGNOSIS — R052 Subacute cough: Secondary | ICD-10-CM

## 2021-07-16 DIAGNOSIS — Z23 Encounter for immunization: Secondary | ICD-10-CM | POA: Diagnosis not present

## 2021-07-16 DIAGNOSIS — R059 Cough, unspecified: Secondary | ICD-10-CM | POA: Diagnosis not present

## 2021-07-16 DIAGNOSIS — R0600 Dyspnea, unspecified: Secondary | ICD-10-CM | POA: Diagnosis not present

## 2021-07-16 DIAGNOSIS — F411 Generalized anxiety disorder: Secondary | ICD-10-CM

## 2021-07-16 DIAGNOSIS — J4531 Mild persistent asthma with (acute) exacerbation: Secondary | ICD-10-CM

## 2021-07-16 DIAGNOSIS — J4540 Moderate persistent asthma, uncomplicated: Secondary | ICD-10-CM | POA: Diagnosis not present

## 2021-07-16 MED ORDER — CLONAZEPAM 0.5 MG PO TABS: 0.5000 mg | ORAL_TABLET | Freq: Three times a day (TID) | ORAL | 1 refills | Status: DC | PRN

## 2021-07-16 MED ORDER — ALBUTEROL SULFATE (2.5 MG/3ML) 0.083% IN NEBU: 2.5000 mg | INHALATION_SOLUTION | Freq: Four times a day (QID) | RESPIRATORY_TRACT | 2 refills | Status: DC | PRN

## 2021-07-16 MED ORDER — BUDESONIDE-FORMOTEROL FUMARATE 80-4.5 MCG/ACT IN AERO: 2.0000 | INHALATION_SPRAY | Freq: Two times a day (BID) | RESPIRATORY_TRACT | 1 refills | Status: DC

## 2021-07-16 NOTE — Progress Notes (Signed)
Subjective:  Patient ID: Julie Rivas, female    DOB: Mar 10, 1956  Age: 65 y.o. MRN: [id number]  CC: Cough and Asthma  This visit occurred during the SARS-CoV-2 public health emergency.  Safety protocols were in place, including screening questions prior to the visit, additional usage of staff PPE, and extensive cleaning of exam room while observing appropriate contact time as indicated for disinfecting solutions.    HPI SCARLET ABAD presents for f/up -  She complains of a 2-week history of nonproductive cough with wheezing.  She is not using any inhalers.  She has felt poorly now for several months and has seen a therapist.  She has been told that she is having panic attacks.  She denies chest pain, hemoptysis, fever, chills, diaphoresis, edema, or fatigue.  Outpatient Medications Prior to Visit  Medication Sig Dispense Refill   carvedilol (COREG) 3.125 MG tablet Take 1 tablet (3.125 mg total) by mouth 2 (two) times daily with a meal. 180 tablet 1   cyclobenzaprine (FLEXERIL) 10 MG tablet TAKE ONE TABLET BY MOUTH UP TO THREE TIMES DAILY 90 tablet 0   gabapentin (NEURONTIN) 300 MG capsule Take 1 capsule by mouth at bedtime 90 capsule 0   rosuvastatin (CRESTOR) 20 MG tablet Take 1 tablet (20 mg total) by mouth daily. 90 tablet 1   spironolactone (ALDACTONE) 50 MG tablet Take 1 tablet (50 mg total) by mouth daily. 90 tablet 0   albuterol (PROVENTIL) (2.5 MG/3ML) 0.083% nebulizer solution Take 3 mLs (2.5 mg total) by nebulization every 6 (six) hours as needed for wheezing or shortness of breath. 150 mL 2   BESIVANCE 0.6 % SUSP Place 1 drop into the left eye 3 (three) times daily.     clonazePAM (KLONOPIN) 0.5 MG tablet TAKE 1 TABLET BY MOUTH THREE TIMES DAILY AS NEEDED FOR ANXIETY 90 tablet 0   Difluprednate 0.05 % EMUL Apply to eye.     gatifloxacin (ZYMAXID) 0.5 % SOLN SMARTSIG:In Eye(s)     PROLENSA 0.07 % SOLN Place 1 drop into the left eye at bedtime.     No facility-administered  medications prior to visit.    ROS Review of Systems  Constitutional:  Negative for chills, diaphoresis, fatigue and fever.  HENT:  Positive for sore throat. Negative for facial swelling, nosebleeds and sinus pressure.   Eyes: Negative.   Respiratory:  Positive for cough and wheezing. Negative for shortness of breath and stridor.   Cardiovascular:  Negative for chest pain, palpitations and leg swelling.  Gastrointestinal:  Negative for abdominal pain, diarrhea and nausea.  Endocrine: Negative.   Genitourinary: Negative.  Negative for difficulty urinating.  Musculoskeletal:  Negative for arthralgias and neck pain.  Skin: Negative.  Negative for color change and rash.  Neurological: Negative.  Negative for dizziness, weakness and light-headedness.  Hematological:  Negative for adenopathy. Does not bruise/bleed easily.  Psychiatric/Behavioral:  Negative for dysphoric mood, sleep disturbance and suicidal ideas. The patient is nervous/anxious. The patient is not hyperactive.    Objective:  BP 128/78 (BP Location: Right Arm, Patient Position: Sitting, Cuff Size: Large)   Pulse 100   Temp 98.1 F (36.7 C) (Oral)   Resp 16   Ht 5\' 2"  (1.575 m)   Wt 146 lb (66.2 kg)   SpO2 99%   BMI 26.70 kg/m   BP Readings from Last 3 Encounters:  07/16/21 128/78  04/21/21 134/84  03/27/21 124/90    Wt Readings from Last 3 Encounters:  07/16/21 146 lb (  66.2 kg)  04/21/21 144 lb (65.3 kg)  03/27/21 144 lb (65.3 kg)    Physical Exam Vitals reviewed.  Constitutional:      Appearance: She is not ill-appearing.  HENT:     Nose: Nose normal.     Mouth/Throat:     Mouth: Mucous membranes are moist.  Eyes:     Conjunctiva/sclera: Conjunctivae normal.  Cardiovascular:     Rate and Rhythm: Normal rate and regular rhythm.     Heart sounds: No murmur heard. Pulmonary:     Effort: Pulmonary effort is normal.     Breath sounds: No stridor. No wheezing, rhonchi or rales.  Abdominal:     General:  Abdomen is flat.     Palpations: There is no mass.     Tenderness: There is no abdominal tenderness. There is no guarding.  Musculoskeletal:        General: Normal range of motion.     Cervical back: Neck supple.     Right lower leg: No edema.     Left lower leg: No edema.  Lymphadenopathy:     Cervical: No cervical adenopathy.  Skin:    General: Skin is warm and dry.     Findings: No rash.  Neurological:     General: No focal deficit present.     Mental Status: She is alert.  Psychiatric:        Attention and Perception: She is inattentive.        Mood and Affect: Mood is anxious. Mood is not depressed.        Speech: Speech is not rapid and pressured, delayed or tangential.        Behavior: Behavior normal.        Thought Content: Thought content normal.    Lab Results  Component Value Date   WBC 5.8 04/21/2021   HGB 13.1 04/21/2021   HCT 38.3 04/21/2021   PLT 256.0 04/21/2021   GLUCOSE 108 (H) 04/21/2021   CHOL 231 (H) 10/31/2020   TRIG 138.0 10/31/2020   HDL 45.50 10/31/2020   LDLCALC 158 (H) 10/31/2020   ALT 13 07/10/2020   AST 16 07/10/2020   NA 142 04/21/2021   K 3.4 (L) 04/21/2021   CL 105 04/21/2021   CREATININE 0.82 04/21/2021   BUN 17 04/21/2021   CO2 30 04/21/2021   TSH 1.18 07/10/2020   PSA WNL 10/30/2015   INR 1.1 (H) 12/31/2015   HGBA1C 6.1 04/21/2021    MM 3D SCREEN BREAST BILATERAL  Result Date: 08/29/2020 CLINICAL DATA:  Screening. EXAM: DIGITAL SCREENING BILATERAL MAMMOGRAM WITH TOMO AND CAD COMPARISON:  Previous exam(s). ACR Breast Density Category c: The breast tissue is heterogeneously dense, which may obscure small masses. FINDINGS: There are no findings suspicious for malignancy. Images were processed with CAD. IMPRESSION: No mammographic evidence of malignancy. A result letter of this screening mammogram will be mailed directly to the patient. RECOMMENDATION: Screening mammogram in one year. (Code:SM-B-01Y) BI-RADS CATEGORY  1: Negative.  Electronically Signed   By: Lovey Newcomer M.D.   On: 08/29/2020 12:07   DG Chest 2 View  Result Date: 07/17/2021 CLINICAL DATA:  Intermittent productive cough and dyspnea on exertion for 1 month. No known injury. History of hypertension, asthma and bronchitis. EXAM: CHEST - 2 VIEW COMPARISON:  X-ray chest 04/22/2021. FINDINGS: The heart size and mediastinal contours are within normal limits. No focal airspace consolidation, pleural effusion, or pneumothorax. The visualized skeletal structures are unremarkable. IMPRESSION: No active cardiopulmonary disease.  Electronically Signed   By: Davina Poke D.O.   On: 07/17/2021 09:59      Assessment & Plan:   Saida was seen today for cough and asthma.  Diagnoses and all orders for this visit:  Flu vaccine need -     Flu Vaccine QUAD High Dose(Fluad)  GAD (generalized anxiety disorder) -     clonazePAM (KLONOPIN) 0.5 MG tablet; Take 1 tablet (0.5 mg total) by mouth 3 (three) times daily as needed. for anxiety  Mild persistent asthma with acute exacerbation -     budesonide-formoterol (SYMBICORT) 80-4.5 MCG/ACT inhaler; Inhale 2 puffs into the lungs 2 (two) times daily. -     albuterol (PROVENTIL) (2.5 MG/3ML) 0.083% nebulizer solution; Take 3 mLs (2.5 mg total) by nebulization every 6 (six) hours as needed for wheezing or shortness of breath.  Moderate persistent asthma without complication -     albuterol (PROVENTIL) (2.5 MG/3ML) 0.083% nebulizer solution; Take 3 mLs (2.5 mg total) by nebulization every 6 (six) hours as needed for wheezing or shortness of breath.  Subacute cough- Her chest x-ray is negative for mass or infiltrate.  Will treat the asthma. -     DG Chest 2 View; Future  I have discontinued Yuridia G. Eichhorn's Besivance, Prolensa, Difluprednate, and gatifloxacin. I have also changed her clonazePAM. Additionally, I am having her start on budesonide-formoterol. Lastly, I am having her maintain her gabapentin, carvedilol,  cyclobenzaprine, rosuvastatin, spironolactone, and albuterol.  Meds ordered this encounter  Medications   clonazePAM (KLONOPIN) 0.5 MG tablet    Sig: Take 1 tablet (0.5 mg total) by mouth 3 (three) times daily as needed. for anxiety    Dispense:  90 tablet    Refill:  1   budesonide-formoterol (SYMBICORT) 80-4.5 MCG/ACT inhaler    Sig: Inhale 2 puffs into the lungs 2 (two) times daily.    Dispense:  3 each    Refill:  1   albuterol (PROVENTIL) (2.5 MG/3ML) 0.083% nebulizer solution    Sig: Take 3 mLs (2.5 mg total) by nebulization every 6 (six) hours as needed for wheezing or shortness of breath.    Dispense:  150 mL    Refill:  2     Follow-up: Return in about 3 weeks (around 08/06/2021).  Scarlette Calico, MD

## 2021-07-16 NOTE — Patient Instructions (Signed)

## 2021-07-17 ENCOUNTER — Telehealth: Payer: Self-pay | Admitting: Internal Medicine

## 2021-07-17 NOTE — Telephone Encounter (Signed)
Patient states rx budesonide-formoterol (SYMBICORT) 80-4.5 MCG/ACT inhaler is to expensive  Patient requesting a call back to discuss affordable alternative medications

## 2021-07-18 ENCOUNTER — Other Ambulatory Visit: Payer: Self-pay | Admitting: Internal Medicine

## 2021-07-18 DIAGNOSIS — J4540 Moderate persistent asthma, uncomplicated: Secondary | ICD-10-CM

## 2021-07-18 MED ORDER — BUDESONIDE 90 MCG/ACT IN AEPB: 1.0000 | INHALATION_SPRAY | Freq: Two times a day (BID) | RESPIRATORY_TRACT | 1 refills | Status: DC

## 2021-07-22 ENCOUNTER — Encounter: Payer: Self-pay | Admitting: Internal Medicine

## 2021-07-22 ENCOUNTER — Other Ambulatory Visit: Payer: Self-pay

## 2021-07-22 ENCOUNTER — Ambulatory Visit (INDEPENDENT_AMBULATORY_CARE_PROVIDER_SITE_OTHER): Payer: Medicare Other | Admitting: Internal Medicine

## 2021-07-22 VITALS — BP 142/78 | HR 81 | Temp 98.1°F | Resp 16 | Ht 62.0 in | Wt 146.0 lb

## 2021-07-22 DIAGNOSIS — E118 Type 2 diabetes mellitus with unspecified complications: Secondary | ICD-10-CM | POA: Diagnosis not present

## 2021-07-22 DIAGNOSIS — I10 Essential (primary) hypertension: Secondary | ICD-10-CM

## 2021-07-22 DIAGNOSIS — E785 Hyperlipidemia, unspecified: Secondary | ICD-10-CM | POA: Diagnosis not present

## 2021-07-22 DIAGNOSIS — J4540 Moderate persistent asthma, uncomplicated: Secondary | ICD-10-CM | POA: Diagnosis not present

## 2021-07-22 DIAGNOSIS — R7303 Prediabetes: Secondary | ICD-10-CM

## 2021-07-22 LAB — BASIC METABOLIC PANEL
BUN: 9 mg/dL (ref 6–23)
CO2: 30 mEq/L (ref 19–32)
Calcium: 8.8 mg/dL (ref 8.4–10.5)
Chloride: 105 mEq/L (ref 96–112)
Creatinine, Ser: 0.62 mg/dL (ref 0.40–1.20)
GFR: 92.13 mL/min (ref >60.00)
Glucose, Bld: 92 mg/dL (ref 70–99)
Potassium: 3.3 mEq/L — ABNORMAL LOW (ref 3.5–5.1)
Sodium: 140 mEq/L (ref 135–145)

## 2021-07-22 LAB — MAGNESIUM: Magnesium: 2 mg/dL (ref 1.5–2.5)

## 2021-07-22 LAB — HEMOGLOBIN A1C: Hgb A1c MFr Bld: 6.7 % — ABNORMAL HIGH (ref 4.6–6.5)

## 2021-07-22 MED ORDER — FLUTICASONE-SALMETEROL 250-50 MCG/ACT IN AEPB: 1.0000 | INHALATION_SPRAY | Freq: Two times a day (BID) | RESPIRATORY_TRACT | 1 refills | Status: DC

## 2021-07-22 NOTE — Telephone Encounter (Signed)
Patient states rx fluticasone-salmeterol (WIXELA INHUB) 250-50 MCG/ACT AEPB is too expensive  Patient requesting an affordable alternative generic brand  Patient states cough medicine discussed w/ MD at previous visit was not prescribed  Please advise

## 2021-07-22 NOTE — Progress Notes (Signed)
Subjective:  Patient ID: Julie Rivas, female    DOB: 08-Mar-1956  Age: 65 y.o. MRN: [id number]  CC: Hypertension and Asthma  This visit occurred during the SARS-CoV-2 public health emergency.  Safety protocols were in place, including screening questions prior to the visit, additional usage of staff PPE, and extensive cleaning of exam room while observing appropriate contact time as indicated for disinfecting solutions.    HPI Julie Rivas presents for f/up  -  Her cough has resolved but she continues to complain of wheezing.  She is not using any inhalers because they are too expensive.  She is active and denies chest pain, shortness of breath, diaphoresis, dizziness, or lightheadedness.  Outpatient Medications Prior to Visit  Medication Sig Dispense Refill   albuterol (PROVENTIL) (2.5 MG/3ML) 0.083% nebulizer solution Take 3 mLs (2.5 mg total) by nebulization every 6 (six) hours as needed for wheezing or shortness of breath. 150 mL 2   carvedilol (COREG) 3.125 MG tablet Take 1 tablet (3.125 mg total) by mouth 2 (two) times daily with a meal. 180 tablet 1   clonazePAM (KLONOPIN) 0.5 MG tablet Take 1 tablet (0.5 mg total) by mouth 3 (three) times daily as needed. for anxiety 90 tablet 1   cyclobenzaprine (FLEXERIL) 10 MG tablet TAKE ONE TABLET BY MOUTH UP TO THREE TIMES DAILY 90 tablet 0   gabapentin (NEURONTIN) 300 MG capsule Take 1 capsule by mouth at bedtime 90 capsule 0   spironolactone (ALDACTONE) 50 MG tablet Take 1 tablet (50 mg total) by mouth daily. 90 tablet 0   Budesonide 90 MCG/ACT inhaler Inhale 1 puff into the lungs 2 (two) times daily. 3 each 1   rosuvastatin (CRESTOR) 20 MG tablet Take 1 tablet (20 mg total) by mouth daily. 90 tablet 1   No facility-administered medications prior to visit.    ROS Review of Systems  Constitutional:  Negative for diaphoresis and fatigue.  HENT: Negative.    Respiratory:  Positive for wheezing. Negative for cough, chest tightness  and shortness of breath.   Cardiovascular:  Negative for chest pain, palpitations and leg swelling.  Gastrointestinal:  Negative for abdominal pain, constipation, diarrhea, nausea and vomiting.  Endocrine: Negative.   Genitourinary:  Negative for difficulty urinating.  Musculoskeletal:  Negative for arthralgias and myalgias.  Skin: Negative.  Negative for color change and pallor.  Neurological:  Negative for dizziness, weakness, light-headedness and numbness.  Hematological:  Negative for adenopathy. Does not bruise/bleed easily.  Psychiatric/Behavioral: Negative.     Objective:  BP (!) 142/78 (BP Location: Right Arm, Patient Position: Sitting, Cuff Size: Large)   Pulse 81   Temp 98.1 F (36.7 C) (Oral)   Resp 16   Ht 5\' 2"  (1.575 m)   Wt 146 lb (66.2 kg)   SpO2 98%   BMI 26.70 kg/m   BP Readings from Last 3 Encounters:  07/22/21 (!) 142/78  07/16/21 128/78  04/21/21 134/84    Wt Readings from Last 3 Encounters:  07/22/21 146 lb (66.2 kg)  07/16/21 146 lb (66.2 kg)  04/21/21 144 lb (65.3 kg)    Physical Exam Vitals reviewed.  Constitutional:      General: She is not in acute distress.    Appearance: She is not ill-appearing, toxic-appearing or diaphoretic.  HENT:     Nose: Nose normal.     Mouth/Throat:     Mouth: Mucous membranes are moist.  Eyes:     General: No scleral icterus.    Conjunctiva/sclera:  Conjunctivae normal.  Cardiovascular:     Rate and Rhythm: Normal rate and regular rhythm.     Heart sounds: No murmur heard. Pulmonary:     Effort: Pulmonary effort is normal.     Breath sounds: No stridor. No wheezing, rhonchi or rales.  Abdominal:     General: Abdomen is flat.     Palpations: There is no mass.     Tenderness: There is no abdominal tenderness. There is no guarding.  Musculoskeletal:        General: Normal range of motion.     Cervical back: Neck supple.     Right lower leg: No edema.     Left lower leg: No edema.  Lymphadenopathy:      Cervical: No cervical adenopathy.  Skin:    General: Skin is warm and dry.  Neurological:     General: No focal deficit present.     Mental Status: She is alert.    Lab Results  Component Value Date   WBC 5.8 04/21/2021   HGB 13.1 04/21/2021   HCT 38.3 04/21/2021   PLT 256.0 04/21/2021   GLUCOSE 92 07/22/2021   CHOL 231 (H) 10/31/2020   TRIG 138.0 10/31/2020   HDL 45.50 10/31/2020   LDLCALC 158 (H) 10/31/2020   ALT 13 07/10/2020   AST 16 07/10/2020   NA 140 07/22/2021   K 3.3 (L) 07/22/2021   CL 105 07/22/2021   CREATININE 0.62 07/22/2021   BUN 9 07/22/2021   CO2 30 07/22/2021   TSH 1.18 07/10/2020   PSA WNL 10/30/2015   INR 1.1 (H) 12/31/2015   HGBA1C 6.7 (H) 07/22/2021    MM 3D SCREEN BREAST BILATERAL  Result Date: 08/29/2020 CLINICAL DATA:  Screening. EXAM: DIGITAL SCREENING BILATERAL MAMMOGRAM WITH TOMO AND CAD COMPARISON:  Previous exam(s). ACR Breast Density Category c: The breast tissue is heterogeneously dense, which may obscure small masses. FINDINGS: There are no findings suspicious for malignancy. Images were processed with CAD. IMPRESSION: No mammographic evidence of malignancy. A result letter of this screening mammogram will be mailed directly to the patient. RECOMMENDATION: Screening mammogram in one year. (Code:SM-B-01Y) BI-RADS CATEGORY  1: Negative. Electronically Signed   By: Lovey Newcomer M.D.   On: 08/29/2020 12:07    Assessment & Plan:   Julie Rivas was seen today for hypertension and asthma.  Diagnoses and all orders for this visit:  Essential hypertension- Her blood pressure is not adequately well controlled.  Will continue the current antihypertensives and she will improve her lifestyle modifications. -     Basic metabolic panel; Future -     Magnesium; Future -     Magnesium -     Basic metabolic panel  Prediabetes- Her A1c is up to 6.7%. -     Basic metabolic panel; Future -     Hemoglobin A1c; Future -     Hemoglobin A1c -     Basic  metabolic panel  Moderate persistent asthma without complication -     fluticasone-salmeterol (WIXELA INHUB) 250-50 MCG/ACT AEPB; Inhale 1 puff into the lungs in the morning and at bedtime.  Type II diabetes mellitus with manifestations (HCC) -     metFORMIN (GLUCOPHAGE XR) 750 MG 24 hr tablet; Take 1 tablet (750 mg total) by mouth daily with breakfast.  Hyperlipidemia with target LDL less than 130 -     rosuvastatin (CRESTOR) 20 MG tablet; Take 1 tablet (20 mg total) by mouth daily.  I have discontinued Venisa G. Mette's  Budesonide. I am also having her start on fluticasone-salmeterol and metFORMIN. Additionally, I am having her maintain her gabapentin, carvedilol, cyclobenzaprine, spironolactone, clonazePAM, albuterol, and rosuvastatin.  Meds ordered this encounter  Medications   fluticasone-salmeterol (WIXELA INHUB) 250-50 MCG/ACT AEPB    Sig: Inhale 1 puff into the lungs in the morning and at bedtime.    Dispense:  120 each    Refill:  1   metFORMIN (GLUCOPHAGE XR) 750 MG 24 hr tablet    Sig: Take 1 tablet (750 mg total) by mouth daily with breakfast.    Dispense:  90 tablet    Refill:  1   rosuvastatin (CRESTOR) 20 MG tablet    Sig: Take 1 tablet (20 mg total) by mouth daily.    Dispense:  90 tablet    Refill:  1     Follow-up: Return in about 3 months (around 10/22/2021).  Scarlette Calico, MD

## 2021-07-22 NOTE — Patient Instructions (Signed)
Asthma, Adult °Asthma is a long-term (chronic) condition that causes recurrent episodes in which the airways become tight and narrow. The airways are the passages that lead from the nose and mouth down into the lungs. Asthma episodes, also called asthma attacks, can cause coughing, wheezing, shortness of breath, and chest pain. The airways can also fill with mucus. During an attack, it can be difficult to breathe. Asthma attacks can range from minor to life threatening. °Asthma cannot be cured, but medicines and lifestyle changes can help control it and treat acute attacks. °What are the causes? °This condition is believed to be caused by inherited (genetic) and environmental factors, but its exact cause is not known. °There are many things that can bring on an asthma attack or make asthma symptoms worse (triggers). Asthma triggers are different for each person. Common triggers include: °Mold. °Dust. °Cigarette smoke. °Cockroaches. °Things that can cause allergy symptoms (allergens), such as animal dander or pollen from trees or grass. °Air pollutants such as household cleaners, wood smoke, smog, or chemical odors. °Cold air, weather changes, and winds (which increase molds and pollen in the air). °Strong emotional expressions such as crying or laughing hard. °Stress. °Certain medicines (such as aspirin) or types of medicines (such as beta-blockers). °Sulfites in foods and drinks. Foods and drinks that may contain sulfites include dried fruit, potato chips, and sparkling grape juice. °Infections or inflammatory conditions such as the flu, a cold, or inflammation of the nasal membranes (rhinitis). °Gastroesophageal reflux disease (GERD). °Exercise or strenuous activity. °What are the signs or symptoms? °Symptoms of this condition may occur right after asthma is triggered or many hours later. Symptoms include: °Wheezing. This can sound like whistling when you breathe. °Excessive nighttime or early morning  coughing. °Frequent or severe coughing with a common cold. °Chest tightness. °Shortness of breath. °Tiredness (fatigue) with minimal activity. °How is this diagnosed? °This condition is diagnosed based on: °Your medical history. °A physical exam. °Tests, which may include: °Lung function studies and pulmonary studies (spirometry). These tests can evaluate the flow of air in your lungs. °Allergy tests. °Imaging tests, such as X-rays. °How is this treated? °There is no cure for this condition, but treatment can help control your symptoms. Treatment for asthma usually involves: °Identifying and avoiding your asthma triggers. °Using medicines to control your symptoms. Generally, two types of medicines are used to treat asthma: °Controller medicines. These help prevent asthma symptoms from occurring. They are usually taken every day. °Fast-acting reliever or rescue medicines. These quickly relieve asthma symptoms by widening the narrow and tight airways. They are used as needed and provide short-term relief. °Using supplemental oxygen. This may be needed during a severe episode. °Using other medicines, such as: °Allergy medicines, such as antihistamines, if your asthma attacks are triggered by allergens. °Immune medicines (immunomodulators). These are medicines that help control the immune system. °Creating an asthma action plan. An asthma action plan is a written plan for managing and treating your asthma attacks. This plan includes: °A list of your asthma triggers and how to avoid them. °Information about when medicines should be taken and when their dosage should be changed. °Instructions about using a device called a peak flow meter. A peak flow meter measures how well the lungs are working and the severity of your asthma. It helps you monitor your condition. °Follow these instructions at home: °Controlling your home environment °Control your home environment in the following ways to help avoid triggers and prevent  asthma attacks: °Change your heating   and air conditioning filter regularly. °Limit your use of fireplaces and wood stoves. °Get rid of pests (such as roaches and mice) and their droppings. °Throw away plants if you see mold on them. °Clean floors and dust surfaces regularly. Use unscented cleaning products. °Try to have someone else vacuum for you regularly. Stay out of rooms while they are being vacuumed and for a short while afterward. If you vacuum, use a dust mask from a hardware store, a double-layered or microfilter vacuum cleaner bag, or a vacuum cleaner with a HEPA filter. °Replace carpet with wood, tile, or vinyl flooring. Carpet can trap dander and dust. °Use allergy-proof pillows, mattress covers, and box spring covers. °Keep your bedroom a trigger-free room. °Avoid pets and keep windows closed when allergens are in the air. °Wash beddings every week in hot water and dry them in a dryer. °Use blankets that are made of polyester or cotton. °Clean bathrooms and kitchens with bleach. If possible, have someone repaint the walls in these rooms with mold-resistant paint. Stay out of the rooms that are being cleaned and painted. °Wash your hands often with soap and water. If soap and water are not available, use hand sanitizer. °Do not allow anyone to smoke in your home. °General instructions °Take over-the-counter and prescription medicines only as told by your health care provider. °Speak with your health care provider if you have questions about how or when to take the medicines. °Make note if you are requiring more frequent dosages. °Do not use any products that contain nicotine or tobacco, such as cigarettes and e-cigarettes. If you need help quitting, ask your health care provider. Also, avoid being exposed to secondhand smoke. °Use a peak flow meter as told by your health care provider. Record and keep track of the readings. °Understand and use the asthma action plan to help minimize, or stop an asthma  attack, without needing to seek medical care. °Make sure you stay up to date on your yearly vaccinations as told by your health care provider. This may include vaccines for the flu and pneumonia. °Avoid outdoor activities when allergen counts are high and when air quality is low. °Wear a ski mask that covers your nose and mouth during outdoor winter activities. Exercise indoors on cold days if you can. °Warm up before exercising, and take time for a cool-down period after exercise. °Keep all follow-up visits as told by your health care provider. This is important. °Where to find more information °For information about asthma, turn to the Centers for Disease Control and Prevention at www.cdc.gov/asthma/faqs °For air quality information, turn to [employer]Now at airnow.gov °Contact a health care provider if: °You have wheezing, shortness of breath, or a cough even while you are taking medicine to prevent attacks. °The mucus you cough up (sputum) is thicker than usual. °Your sputum changes from clear or white to yellow, green, gray, or bloody. °Your medicines are causing side effects, such as a rash, itching, swelling, or trouble breathing. °You need to use a reliever medicine more than 2-3 times a week. °Your peak flow reading is still at 50-79% of your personal best after following your action plan for 1 hour. °You have a fever. °Get help right away if: °You are getting worse and do not respond to treatment during an asthma attack. °You are short of breath when at rest or when doing very little physical activity. °You have difficulty eating, drinking, or talking. °You have chest pain or tightness. °You develop a fast heartbeat or   palpitations. °You have a bluish color to your lips or fingernails. °You are light-headed or dizzy, or you faint. °Your peak flow reading is less than 50% of your personal best. °You feel too tired to breathe normally. °Summary °Asthma is a long-term (chronic) condition that causes recurrent  episodes in which the airways become tight and narrow. These episodes can cause coughing, wheezing, shortness of breath, and chest pain. °Asthma cannot be cured, but medicines and lifestyle changes can help control it and treat acute attacks. °Make sure you understand how to avoid triggers and how and when to use your medicines. °Asthma attacks can range from minor to life threatening. Get help right away if you have an asthma attack and do not respond to treatment with your usual rescue medicines. °This information is not intended to replace advice given to you by your health care provider. Make sure you discuss any questions you have with your health care provider. °Document Revised: 05/24/2020 Document Reviewed: 12/27/2019 °[employer]vier Patient Education © 2022 [employer] ° °

## 2021-07-23 DIAGNOSIS — E118 Type 2 diabetes mellitus with unspecified complications: Secondary | ICD-10-CM | POA: Insufficient documentation

## 2021-07-23 MED ORDER — ROSUVASTATIN CALCIUM 20 MG PO TABS
20.0000 mg | ORAL_TABLET | Freq: Every day | ORAL | 1 refills | Status: DC
Start: 2021-07-23 — End: 2021-12-25

## 2021-07-23 MED ORDER — METFORMIN HCL ER 750 MG PO TB24: 750.0000 mg | ORAL_TABLET | Freq: Every day | ORAL | 1 refills | Status: DC

## 2021-07-24 ENCOUNTER — Telehealth: Payer: Self-pay | Admitting: Internal Medicine

## 2021-07-24 NOTE — Telephone Encounter (Signed)
Pt. Called and states that she has recently had diabetes diagnosis. Would like to discuss that and also discuss medication that was prescribed for diabetes.     Callback #- [phone]

## 2021-07-28 ENCOUNTER — Other Ambulatory Visit: Payer: Self-pay | Admitting: Internal Medicine

## 2021-07-28 DIAGNOSIS — Z1231 Encounter for screening mammogram for malignant neoplasm of breast: Secondary | ICD-10-CM

## 2021-08-07 ENCOUNTER — Ambulatory Visit: Payer: Medicare Other | Admitting: Internal Medicine

## 2021-08-28 ENCOUNTER — Ambulatory Visit
Admission: RE | Admit: 2021-08-28 | Discharge: 2021-08-28 | Disposition: A | Discharge status: Home / Self Care | Payer: Medicare Other | Source: Ambulatory Visit | Attending: Internal Medicine | Admitting: Internal Medicine

## 2021-08-28 ENCOUNTER — Other Ambulatory Visit: Payer: Self-pay

## 2021-08-28 DIAGNOSIS — Z1231 Encounter for screening mammogram for malignant neoplasm of breast: Secondary | ICD-10-CM

## 2021-09-07 ENCOUNTER — Encounter: Payer: Self-pay | Admitting: Internal Medicine

## 2021-09-09 ENCOUNTER — Other Ambulatory Visit: Payer: Self-pay | Admitting: Internal Medicine

## 2021-09-09 DIAGNOSIS — I10 Essential (primary) hypertension: Secondary | ICD-10-CM

## 2021-09-09 DIAGNOSIS — E876 Hypokalemia: Secondary | ICD-10-CM

## 2021-09-09 MED ORDER — POTASSIUM CHLORIDE CRYS ER 20 MEQ PO TBCR: 20.0000 meq | EXTENDED_RELEASE_TABLET | Freq: Two times a day (BID) | ORAL | 0 refills | Status: DC

## 2021-11-26 ENCOUNTER — Other Ambulatory Visit: Payer: Self-pay | Admitting: Internal Medicine

## 2021-11-26 DIAGNOSIS — F411 Generalized anxiety disorder: Secondary | ICD-10-CM

## 2021-11-26 DIAGNOSIS — E876 Hypokalemia: Secondary | ICD-10-CM

## 2021-11-26 DIAGNOSIS — I10 Essential (primary) hypertension: Secondary | ICD-10-CM

## 2021-11-28 ENCOUNTER — Telehealth: Payer: Self-pay

## 2021-11-28 ENCOUNTER — Other Ambulatory Visit: Payer: Self-pay | Admitting: Internal Medicine

## 2021-11-28 DIAGNOSIS — J4540 Moderate persistent asthma, uncomplicated: Secondary | ICD-10-CM

## 2021-11-28 MED ORDER — BUDESONIDE-FORMOTEROL FUMARATE 80-4.5 MCG/ACT IN AERO: 2.0000 | INHALATION_SPRAY | Freq: Two times a day (BID) | RESPIRATORY_TRACT | 1 refills | Status: DC

## 2021-11-28 NOTE — Telephone Encounter (Signed)
Pt is requesting another inhaler. Pt is having difficulty with Asthma due to the pollen. Pt states the last one prescribed was to expensive to and she could afford it. ? ?Please advise. ?

## 2021-12-09 ENCOUNTER — Other Ambulatory Visit: Payer: Self-pay | Admitting: Internal Medicine

## 2021-12-09 ENCOUNTER — Telehealth: Payer: Self-pay

## 2021-12-09 DIAGNOSIS — E118 Type 2 diabetes mellitus with unspecified complications: Secondary | ICD-10-CM

## 2021-12-09 MED ORDER — ACCU-CHEK GUIDE VI STRP: 1.0000 | ORAL_STRIP | Freq: Two times a day (BID) | 2 refills | Status: DC

## 2021-12-09 MED ORDER — ACCU-CHEK GUIDE ME W/DEVICE KIT: 1.0000 | PACK | Freq: Two times a day (BID) | 2 refills | Status: DC

## 2021-12-09 NOTE — Telephone Encounter (Signed)
Pt is calling to get a new Rx for Accu-chek montior and  test strips. ? ?Pharmacy: ?[facility] ? ?LOV 07/22/21 ?ROV 12/24/20 ? ?

## 2021-12-24 ENCOUNTER — Ambulatory Visit (INDEPENDENT_AMBULATORY_CARE_PROVIDER_SITE_OTHER): Payer: Medicare Other | Admitting: Internal Medicine

## 2021-12-24 ENCOUNTER — Encounter: Payer: Self-pay | Admitting: Internal Medicine

## 2021-12-24 VITALS — BP 144/98 | HR 108 | Temp 98.3°F | Resp 16 | Ht 62.0 in | Wt 142.0 lb

## 2021-12-24 DIAGNOSIS — Z23 Encounter for immunization: Secondary | ICD-10-CM | POA: Diagnosis not present

## 2021-12-24 DIAGNOSIS — N1832 Chronic kidney disease, stage 3b: Secondary | ICD-10-CM | POA: Diagnosis not present

## 2021-12-24 DIAGNOSIS — Z0000 Encounter for general adult medical examination without abnormal findings: Secondary | ICD-10-CM | POA: Diagnosis not present

## 2021-12-24 DIAGNOSIS — E118 Type 2 diabetes mellitus with unspecified complications: Secondary | ICD-10-CM | POA: Diagnosis not present

## 2021-12-24 DIAGNOSIS — E785 Hyperlipidemia, unspecified: Secondary | ICD-10-CM

## 2021-12-24 DIAGNOSIS — Z0001 Encounter for general adult medical examination with abnormal findings: Secondary | ICD-10-CM

## 2021-12-24 DIAGNOSIS — E538 Deficiency of other specified B group vitamins: Secondary | ICD-10-CM | POA: Diagnosis not present

## 2021-12-24 DIAGNOSIS — R9431 Abnormal electrocardiogram [ECG] [EKG]: Secondary | ICD-10-CM

## 2021-12-24 DIAGNOSIS — R072 Precordial pain: Secondary | ICD-10-CM

## 2021-12-24 DIAGNOSIS — G63 Polyneuropathy in diseases classified elsewhere: Secondary | ICD-10-CM | POA: Diagnosis not present

## 2021-12-24 DIAGNOSIS — T502X5A Adverse effect of carbonic-anhydrase inhibitors, benzothiadiazides and other diuretics, initial encounter: Secondary | ICD-10-CM | POA: Diagnosis not present

## 2021-12-24 DIAGNOSIS — E876 Hypokalemia: Secondary | ICD-10-CM

## 2021-12-24 DIAGNOSIS — I10 Essential (primary) hypertension: Secondary | ICD-10-CM

## 2021-12-24 DIAGNOSIS — I739 Peripheral vascular disease, unspecified: Secondary | ICD-10-CM

## 2021-12-24 LAB — FOLATE: Folate: 14.9 ng/mL (ref >5.9)

## 2021-12-24 LAB — MICROALBUMIN / CREATININE URINE RATIO
Creatinine,U: 166.1 mg/dL
Microalb Creat Ratio: 1.2 mg/g (ref 0.0–30.0)
Microalb, Ur: 2 mg/dL — ABNORMAL HIGH (ref 0.0–1.9)

## 2021-12-24 LAB — HEMOGLOBIN A1C: Hgb A1c MFr Bld: 6.3 % (ref 4.6–6.5)

## 2021-12-24 LAB — CBC WITH DIFFERENTIAL/PLATELET
Basophils Absolute: 0 10*3/uL (ref 0.0–0.1)
Basophils Relative: 0.7 % (ref 0.0–3.0)
Eosinophils Absolute: 0.1 10*3/uL (ref 0.0–0.7)
Eosinophils Relative: 1 % (ref 0.0–5.0)
HCT: 49.4 % — ABNORMAL HIGH (ref 36.0–46.0)
Hemoglobin: 16.6 g/dL — ABNORMAL HIGH (ref 12.0–15.0)
Lymphocytes Relative: 35.9 % (ref 12.0–46.0)
Lymphs Abs: 1.8 10*3/uL (ref 0.7–4.0)
MCHC: 33.6 g/dL (ref 30.0–36.0)
MCV: 94.8 fl (ref 78.0–100.0)
Monocytes Absolute: 0.4 10*3/uL (ref 0.1–1.0)
Monocytes Relative: 7.2 % (ref 3.0–12.0)
Neutro Abs: 2.8 10*3/uL (ref 1.4–7.7)
Neutrophils Relative %: 55.2 % (ref 43.0–77.0)
Platelets: 233 10*3/uL (ref 150.0–400.0)
RBC: 5.21 Mil/uL — ABNORMAL HIGH (ref 3.87–5.11)
RDW: 13.3 % (ref 11.5–15.5)
WBC: 5 10*3/uL (ref 4.0–10.5)

## 2021-12-24 LAB — URINALYSIS, ROUTINE W REFLEX MICROSCOPIC
Bilirubin Urine: NEGATIVE
Ketones, ur: NEGATIVE
Leukocytes,Ua: NEGATIVE
Nitrite: NEGATIVE
Specific Gravity, Urine: 1.015 (ref 1.000–1.030)
Total Protein, Urine: NEGATIVE
Urine Glucose: NEGATIVE
Urobilinogen, UA: 0.2 (ref 0.0–1.0)
pH: 6 (ref 5.0–8.0)

## 2021-12-24 LAB — BRAIN NATRIURETIC PEPTIDE: Pro B Natriuretic peptide (BNP): 4 pg/mL (ref 0.0–100.0)

## 2021-12-24 LAB — TROPONIN I (HIGH SENSITIVITY): High Sens Troponin I: 5 ng/L (ref 2–17)

## 2021-12-24 MED ORDER — CARVEDILOL 3.125 MG PO TABS: 3.1250 mg | ORAL_TABLET | Freq: Two times a day (BID) | ORAL | 0 refills | Status: DC

## 2021-12-24 NOTE — Progress Notes (Signed)
? ?Subjective:  ?Patient ID: Julie Rivas, female    DOB: 01-Apr-1956  Age: 66 y.o. MRN: [id number] ? ?CC: Annual Exam, Hypertension, Diabetes, and Hyperlipidemia ? ? ?HPI ?[name] presents for a CPX and f/up -  ? ?She complains of lower extremity pain with activity.  She has not noticed any swelling.  She complains of a 1 month history of nonproductive cough and wheezing.  She has a chest pain that she describes as feeling like someone is sitting on her chest.  She is not taking carvedilol. ? ?Outpatient Medications Prior to Visit  ?Medication Sig Dispense Refill  ? albuterol (PROVENTIL) (2.5 MG/3ML) 0.083% nebulizer solution Take 3 mLs (2.5 mg total) by nebulization every 6 (six) hours as needed for wheezing or shortness of breath. 150 mL 2  ? Blood Glucose Monitoring Suppl (ACCU-CHEK GUIDE ME) w/Device KIT 1 Act by Does not apply route 2 (two) times daily. 1 kit 2  ? budesonide-formoterol (SYMBICORT) 80-4.5 MCG/ACT inhaler Inhale 2 puffs into the lungs 2 (two) times daily. 30.6 g 1  ? clonazePAM (KLONOPIN) 0.5 MG tablet TAKE 1 TABLET BY MOUTH THREE TIMES DAILY AS NEEDED FOR ANXIETY 90 tablet 0  ? cyclobenzaprine (FLEXERIL) 10 MG tablet TAKE ONE TABLET BY MOUTH UP TO THREE TIMES DAILY 90 tablet 0  ? gabapentin (NEURONTIN) 300 MG capsule Take 1 capsule by mouth at bedtime 90 capsule 0  ? glucose blood (ACCU-CHEK GUIDE) test strip 1 each by Other route 2 (two) times daily. Use as instructed 200 strip 2  ? metFORMIN (GLUCOPHAGE XR) 750 MG 24 hr tablet Take 1 tablet (750 mg total) by mouth daily with breakfast. 90 tablet 1  ? potassium chloride SA (KLOR-CON M) 20 MEQ tablet Take 1 tablet (20 mEq total) by mouth 2 (two) times daily. 180 tablet 0  ? spironolactone (ALDACTONE) 50 MG tablet Take 1 tablet by mouth once daily 90 tablet 0  ? carvedilol (COREG) 3.125 MG tablet Take 1 tablet (3.125 mg total) by mouth 2 (two) times daily with a meal. 180 tablet 1  ? rosuvastatin (CRESTOR) 20 MG tablet Take 1 tablet  (20 mg total) by mouth daily. 90 tablet 1  ? ?No facility-administered medications prior to visit.  ? ? ?ROS ?Review of Systems  ?Constitutional:  Negative for appetite change, chills, diaphoresis and fatigue.  ?HENT:  Positive for congestion. Negative for nosebleeds, sinus pressure, sore throat and trouble swallowing.   ?Eyes: Negative.   ?Respiratory:  Positive for cough, shortness of breath and wheezing. Negative for choking and chest tightness.   ?Cardiovascular:  Positive for chest pain. Negative for palpitations and leg swelling.  ?     "Something is sitting on her chest"  ?Gastrointestinal: Negative.  Negative for abdominal pain, constipation, diarrhea, nausea and vomiting.  ?Endocrine: Negative.   ?Genitourinary: Negative.   ?Musculoskeletal: Negative.   ?Skin: Negative.   ?Neurological:  Positive for dizziness and light-headedness. Negative for seizures, weakness, numbness and headaches.  ?Hematological:  Negative for adenopathy. Does not bruise/bleed easily.  ?Psychiatric/Behavioral: Negative.    ? ?Objective:  ?BP (!) 144/98 (BP Location: Left Arm, Patient Position: Sitting, Cuff Size: Large)   Pulse (!) 108   Temp 98.3 ?F (36.8 ?C) (Oral)   Resp 16   Ht _0  (1.575 m)   Wt 142 lb (64.4 kg)   SpO2 95%   BMI 25.97 kg/m?  ? ?BP Readings from Last 3 Encounters:  ?12/24/21 (!) 144/98  ?07/22/21 (!) 142/78  ?07/16/21  128/78  ? ? ?Wt Readings from Last 3 Encounters:  ?12/24/21 142 lb (64.4 kg)  ?07/22/21 146 lb (66.2 kg)  ?07/16/21 146 lb (66.2 kg)  ? ? ?Physical Exam ?Vitals reviewed.  ?Constitutional:   ?   Appearance: Normal appearance.  ?HENT:  ?   Nose: Nose normal.  ?   Mouth/Throat:  ?   Mouth: Mucous membranes are moist.  ?Eyes:  ?   General: No scleral icterus. ?   Conjunctiva/sclera: Conjunctivae normal.  ?Cardiovascular:  ?   Rate and Rhythm: Normal rate and regular rhythm.  ?   Heart sounds: Normal heart sounds, S1 normal and S2 normal. No murmur heard. ?  No friction rub. No gallop.  ?    Comments: EKG- ?NSR, 97 bpm ??LAE ?Septal infract pattern is old ?No LVH ?Pulmonary:  ?   Effort: Pulmonary effort is normal.  ?   Breath sounds: No stridor. No wheezing or rales.  ?Abdominal:  ?   General: Abdomen is flat.  ?   Palpations: There is no mass.  ?   Tenderness: There is no abdominal tenderness. There is no guarding.  ?   Hernia: No hernia is present.  ?Musculoskeletal:     ?   General: No swelling.  ?   Cervical back: Neck supple.  ?   Right lower leg: No edema.  ?   Left lower leg: No edema.  ?Lymphadenopathy:  ?   Cervical: No cervical adenopathy.  ?Skin: ?   General: Skin is warm and dry.  ?Neurological:  ?   General: No focal deficit present.  ?   Mental Status: She is alert. Mental status is at baseline.  ?Psychiatric:     ?   Mood and Affect: Mood normal.     ?   Behavior: Behavior normal.  ? ? ?Lab Results  ?Component Value Date  ? WBC 5.0 12/24/2021  ? HGB 16.6 (H) 12/24/2021  ? HCT 49.4 (H) 12/24/2021  ? PLT 233.0 12/24/2021  ? GLUCOSE 100 (H) 12/24/2021  ? CHOL 286 (H) 12/24/2021  ? TRIG 136.0 12/24/2021  ? HDL 47.00 12/24/2021  ? LDLCALC 212 (H) 12/24/2021  ? ALT 13 07/10/2020  ? AST 16 07/10/2020  ? NA 137 12/24/2021  ? K 4.4 12/24/2021  ? CL 101 12/24/2021  ? CREATININE 0.75 12/24/2021  ? BUN 18 12/24/2021  ? CO2 27 12/24/2021  ? TSH 1.18 07/10/2020  ? PSA WNL 10/30/2015  ? INR 1.1 (H) 12/31/2015  ? HGBA1C 6.3 12/24/2021  ? MICROALBUR 2.0 (H) 12/24/2021  ? ? ?MM 3D SCREEN BREAST BILATERAL ? ?Result Date: 08/29/2021 ?CLINICAL DATA:  Screening. EXAM: DIGITAL SCREENING BILATERAL MAMMOGRAM WITH TOMOSYNTHESIS AND CAD TECHNIQUE: Bilateral screening digital craniocaudal and mediolateral oblique mammograms were obtained. Bilateral screening digital breast tomosynthesis was performed. The images were evaluated with computer-aided detection. COMPARISON:  Previous exam(s). ACR Breast Density Category c: The breast tissue is heterogeneously dense, which may obscure small masses. FINDINGS: There are no  findings suspicious for malignancy. IMPRESSION: No mammographic evidence of malignancy. A result letter of this screening mammogram will be mailed directly to the patient. RECOMMENDATION: Screening mammogram in one year. (Code:SM-B-01Y) BI-RADS CATEGORY  1: Negative. Electronically Signed   By: Valentino Saxon M.D.   On: 08/29/2021 16:56  ? ? ?Assessment & Plan:  ? ?Masyn was seen today for annual exam, hypertension, diabetes and hyperlipidemia. ? ?Diagnoses and all orders for this visit: ? ?Essential hypertension- Her blood pressure is not adequately well  controlled.  Will restart carvedilol. ?-     Basic metabolic panel; Future ?-     CBC with Differential/Platelet; Future ?-     Urinalysis, Routine w reflex microscopic; Future ?-     carvedilol (COREG) 3.125 MG tablet; Take 1 tablet (3.125 mg total) by mouth 2 (two) times daily with a meal. ?-     EKG 12-Lead ?-     Urinalysis, Routine w reflex microscopic ?-     CBC with Differential/Platelet ?-     Basic metabolic panel ? ?Type II diabetes mellitus with manifestations ([place])- Her blood sugar is well controlled. ?-     Basic metabolic panel; Future ?-     Hemoglobin A1c; Future ?-     Microalbumin / creatinine urine ratio; Future ?-     HM Diabetes Foot Exam ?-     Microalbumin / creatinine urine ratio ?-     Hemoglobin A1c ?-     Basic metabolic panel ? ?Vitamin B12 deficiency neuropathy ([place]) ?-     Folate; Future ?-     Folate ? ?Stage 3b chronic kidney disease ([place])- Her renal function is stable. ?-     Basic metabolic panel; Future ?-     Urinalysis, Routine w reflex microscopic; Future ?-     Urinalysis, Routine w reflex microscopic ?-     Basic metabolic panel ? ?Diuretic-induced hypokalemia- Her K+ is normal now. ?-     Basic metabolic panel; Future ?-     Magnesium; Future ?-     Magnesium ?-     Basic metabolic panel ? ?Hyperlipidemia with target LDL less than 130- She has not achieved her LDL goal.  Will restart the statin. ?-     Lipid panel;  Future ?-     Lipid panel ?-     rosuvastatin (CRESTOR) 20 MG tablet; Take 1 tablet (20 mg total) by mouth daily. ? ?Claudication of both lower extremities ([place])- Her ABIs were normal. ?-     VAS [place] ABI WITH/WO TB

## 2021-12-24 NOTE — Patient Instructions (Signed)
Hypertension, Adult High blood pressure (hypertension) is when the force of blood pumping through the arteries is too strong. The arteries are the blood vessels that carry blood from the heart throughout the body. Hypertension forces the heart to work harder to pump blood and may cause arteries to become narrow or stiff. Untreated or uncontrolled hypertension can lead to a heart attack, heart failure, a stroke, kidney disease, and other problems. A blood pressure reading consists of a higher number over a lower number. Ideally, your blood pressure should be below 120/80. The first ("top") number is called the systolic pressure. It is a measure of the pressure in your arteries as your heart beats. The second ("bottom") number is called the diastolic pressure. It is a measure of the pressure in your arteries as the heart relaxes. What are the causes? The exact cause of this condition is not known. There are some conditions that result in high blood pressure. What increases the risk? Certain factors may make you more likely to develop high blood pressure. Some of these risk factors are under your control, including: Smoking. Not getting enough exercise or physical activity. Being overweight. Having too much fat, sugar, calories, or salt (sodium) in your diet. Drinking too much alcohol. Other risk factors include: Having a personal history of heart disease, diabetes, high cholesterol, or kidney disease. Stress. Having a family history of high blood pressure and high cholesterol. Having obstructive sleep apnea. Age. The risk increases with age. What are the signs or symptoms? High blood pressure may not cause symptoms. Very high blood pressure (hypertensive crisis) may cause: Headache. Fast or irregular heartbeats (palpitations). Shortness of breath. Nosebleed. Nausea and vomiting. Vision changes. Severe chest pain, dizziness, and seizures. How is this diagnosed? This condition is diagnosed by  measuring your blood pressure while you are seated, with your arm resting on a flat surface, your legs uncrossed, and your feet flat on the floor. The cuff of the blood pressure monitor will be placed directly against the skin of your upper arm at the level of your heart. Blood pressure should be measured at least twice using the same arm. Certain conditions can cause a difference in blood pressure between your right and left arms. If you have a high blood pressure reading during one visit or you have normal blood pressure with other risk factors, you may be asked to: Return on a different day to have your blood pressure checked again. Monitor your blood pressure at home for 1 week or longer. If you are diagnosed with hypertension, you may have other blood or imaging tests to help your health care provider understand your overall risk for other conditions. How is this treated? This condition is treated by making healthy lifestyle changes, such as eating healthy foods, exercising more, and reducing your alcohol intake. You may be referred for counseling on a healthy diet and physical activity. Your health care provider may prescribe medicine if lifestyle changes are not enough to get your blood pressure under control and if: Your systolic blood pressure is above 130. Your diastolic blood pressure is above 80. Your personal target blood pressure may vary depending on your medical conditions, your age, and other factors. Follow these instructions at home: Eating and drinking  Eat a diet that is high in fiber and potassium, and low in sodium, added sugar, and fat. An example of this eating plan is called the DASH diet. DASH stands for Dietary Approaches to Stop Hypertension. To eat this way: Eat   plenty of fresh fruits and vegetables. Try to fill one half of your plate at each meal with fruits and vegetables. Eat whole grains, such as whole-wheat pasta, brown rice, or whole-grain bread. Fill about one  fourth of your plate with whole grains. Eat or drink low-fat dairy products, such as skim milk or low-fat yogurt. Avoid fatty cuts of meat, processed or cured meats, and poultry with skin. Fill about one fourth of your plate with lean proteins, such as fish, chicken without skin, beans, eggs, or tofu. Avoid pre-made and processed foods. These tend to be higher in sodium, added sugar, and fat. Reduce your daily sodium intake. Many people with hypertension should eat less than 1,500 mg of sodium a day. Do not drink alcohol if: Your health care provider tells you not to drink. You are pregnant, may be pregnant, or are planning to become pregnant. If you drink alcohol: Limit how much you have to: 0-1 drink a day for women. 0-2 drinks a day for men. Know how much alcohol is in your drink. In the [place] one drink equals one 12 oz bottle of beer (355 mL), one 5 oz glass of wine (148 mL), or one 1 oz glass of hard liquor (44 mL). Lifestyle  Work with your health care provider to maintain a healthy body weight or to lose weight. Ask what an ideal weight is for you. Get at least 30 minutes of exercise that causes your heart to beat faster (aerobic exercise) most days of the week. Activities may include walking, swimming, or biking. Include exercise to strengthen your muscles (resistance exercise), such as Pilates or lifting weights, as part of your weekly exercise routine. Try to do these types of exercises for 30 minutes at least 3 days a week. Do not use any products that contain nicotine or tobacco. These products include cigarettes, chewing tobacco, and vaping devices, such as e-cigarettes. If you need help quitting, ask your health care provider. Monitor your blood pressure at home as told by your health care provider. Keep all follow-up visits. This is important. Medicines Take over-the-counter and prescription medicines only as told by your health care provider. Follow directions carefully. Blood  pressure medicines must be taken as prescribed. Do not skip doses of blood pressure medicine. Doing this puts you at risk for problems and can make the medicine less effective. Ask your health care provider about side effects or reactions to medicines that you should watch for. Contact a health care provider if you: Think you are having a reaction to a medicine you are taking. Have headaches that keep coming back (recurring). Feel dizzy. Have swelling in your ankles. Have trouble with your vision. Get help right away if you: Develop a severe headache or confusion. Have unusual weakness or numbness. Feel faint. Have severe pain in your chest or abdomen. Vomit repeatedly. Have trouble breathing. These symptoms may be an emergency. Get help right away. Call 911. Do not wait to see if the symptoms will go away. Do not drive yourself to the hospital. Summary Hypertension is when the force of blood pumping through your arteries is too strong. If this condition is not controlled, it may put you at risk for serious complications. Your personal target blood pressure may vary depending on your medical conditions, your age, and other factors. For most people, a normal blood pressure is less than 120/80. Hypertension is treated with lifestyle changes, medicines, or a combination of both. Lifestyle changes include losing weight, eating a healthy,   low-sodium diet, exercising more, and limiting alcohol. This information is not intended to replace advice given to you by your health care provider. Make sure you discuss any questions you have with your health care provider. Document Revised: 07/01/2021 Document Reviewed: 07/01/2021 Elsevier Patient Education  2023 [employer]  

## 2021-12-25 ENCOUNTER — Ambulatory Visit (HOSPITAL_COMMUNITY)
Admission: RE | Admit: 2021-12-25 | Discharge: 2021-12-25 | Disposition: A | Discharge status: Home / Self Care | Payer: Medicare Other | Source: Ambulatory Visit | Attending: Internal Medicine | Admitting: Internal Medicine

## 2021-12-25 DIAGNOSIS — I739 Peripheral vascular disease, unspecified: Secondary | ICD-10-CM | POA: Diagnosis not present

## 2021-12-25 LAB — BASIC METABOLIC PANEL
BUN: 18 mg/dL (ref 6–23)
CO2: 27 mEq/L (ref 19–32)
Calcium: 10.1 mg/dL (ref 8.4–10.5)
Chloride: 101 mEq/L (ref 96–112)
Creatinine, Ser: 0.75 mg/dL (ref 0.40–1.20)
GFR: 82.12 mL/min (ref >60.00)
Glucose, Bld: 100 mg/dL — ABNORMAL HIGH (ref 70–99)
Potassium: 4.4 mEq/L (ref 3.5–5.1)
Sodium: 137 mEq/L (ref 135–145)

## 2021-12-25 LAB — LIPID PANEL
Cholesterol: 286 mg/dL — ABNORMAL HIGH (ref 0–200)
HDL: 47 mg/dL (ref >39.00)
LDL Cholesterol: 212 mg/dL — ABNORMAL HIGH (ref 0–99)
NonHDL: 238.74
Total CHOL/HDL Ratio: 6
Triglycerides: 136 mg/dL (ref 0.0–149.0)
VLDL: 27.2 mg/dL (ref 0.0–40.0)

## 2021-12-25 LAB — MAGNESIUM: Magnesium: 2.2 mg/dL (ref 1.5–2.5)

## 2021-12-25 MED ORDER — ROSUVASTATIN CALCIUM 20 MG PO TABS: 20.0000 mg | ORAL_TABLET | Freq: Every day | ORAL | 1 refills | Status: DC

## 2021-12-29 NOTE — Assessment & Plan Note (Signed)
Exam completed Labs reviewed Vaccines reviewed and updated Cancer screenings are up-to-date Patient education was given 

## 2021-12-31 ENCOUNTER — Ambulatory Visit: Payer: Medicare Other | Admitting: Internal Medicine

## 2022-01-27 ENCOUNTER — Other Ambulatory Visit: Payer: Self-pay | Admitting: Internal Medicine

## 2022-01-27 DIAGNOSIS — I10 Essential (primary) hypertension: Secondary | ICD-10-CM

## 2022-01-27 DIAGNOSIS — E876 Hypokalemia: Secondary | ICD-10-CM

## 2022-01-27 DIAGNOSIS — F411 Generalized anxiety disorder: Secondary | ICD-10-CM

## 2022-01-27 NOTE — Telephone Encounter (Signed)
1.Medication Requested: gabapentin (NEURONTIN) 300 MG capsule cyclobenzaprine (FLEXERIL) 10 MG tablet Flexiril   2. Pharmacy (Name, [address], [place]): [facility] Phone:  [phone]  Fax:  [phone]     3. On Med List: yes   4. Last Visit with PCP:  5. Next visit date with PCP:   Agent: Please be advised that RX refills may take up to 3 business days. We ask that you follow-up with your pharmacy.

## 2022-02-03 ENCOUNTER — Telehealth: Payer: Self-pay | Admitting: Internal Medicine

## 2022-02-03 NOTE — Telephone Encounter (Signed)
Pt called in states she has a bad cough x3 weeks- wanted to see PCP.   Informed pt of next available, pt declined.Offered her to see another provider- pt also declined.  Wanted to know if a message could be sent to assistant to call her in regards to this.    Call mobile in chart.

## 2022-02-04 ENCOUNTER — Ambulatory Visit (INDEPENDENT_AMBULATORY_CARE_PROVIDER_SITE_OTHER): Payer: Medicare Other | Admitting: Internal Medicine

## 2022-02-04 ENCOUNTER — Encounter: Payer: Self-pay | Admitting: Internal Medicine

## 2022-02-04 VITALS — BP 122/76 | HR 85 | Temp 98.6°F | Ht 62.0 in | Wt 145.0 lb

## 2022-02-04 DIAGNOSIS — J4541 Moderate persistent asthma with (acute) exacerbation: Secondary | ICD-10-CM

## 2022-02-04 DIAGNOSIS — J4540 Moderate persistent asthma, uncomplicated: Secondary | ICD-10-CM

## 2022-02-04 DIAGNOSIS — J0100 Acute maxillary sinusitis, unspecified: Secondary | ICD-10-CM | POA: Diagnosis not present

## 2022-02-04 DIAGNOSIS — I10 Essential (primary) hypertension: Secondary | ICD-10-CM

## 2022-02-04 MED ORDER — AMOXICILLIN-POT CLAVULANATE 875-125 MG PO TABS: 1.0000 | ORAL_TABLET | Freq: Two times a day (BID) | ORAL | 0 refills | Status: AC

## 2022-02-04 MED ORDER — MONTELUKAST SODIUM 10 MG PO TABS: 10.0000 mg | ORAL_TABLET | Freq: Every day | ORAL | 1 refills | Status: DC

## 2022-02-04 MED ORDER — HYDROCODONE BIT-HOMATROP MBR 5-1.5 MG/5ML PO SOLN: 5.0000 mL | Freq: Three times a day (TID) | ORAL | 0 refills | Status: AC | PRN

## 2022-02-04 MED ORDER — METHYLPREDNISOLONE ACETATE 80 MG/ML IJ SUSP
120.0000 mg | Freq: Once | INTRAMUSCULAR | Status: AC
  Administered 2022-02-04: 120 mg via INTRAMUSCULAR

## 2022-02-04 NOTE — Progress Notes (Unsigned)
Subjective:  Patient ID: Julie Rivas, female    DOB: [birth year]  Age: 66 y.o. MRN: [id number]  CC: Asthma and Sinusitis   HPI Julie Rivas presents for f/up -  She complains of a several week history of thick, yellow nasal phlegm with facial discomfort.  She also has a cough productive of clear phlegm with wheezing that worsens at night.  She is using albuterol and Symbicort.  Outpatient Medications Prior to Visit  Medication Sig Dispense Refill   albuterol (PROVENTIL) (2.5 MG/3ML) 0.083% nebulizer solution Take 3 mLs (2.5 mg total) by nebulization every 6 (six) hours as needed for wheezing or shortness of breath. 150 mL 2   Blood Glucose Monitoring Suppl (ACCU-CHEK GUIDE ME) w/Device KIT 1 Act by Does not apply route 2 (two) times daily. 1 kit 2   budesonide-formoterol (SYMBICORT) 80-4.5 MCG/ACT inhaler Inhale 2 puffs into the lungs 2 (two) times daily. 30.6 g 1   carvedilol (COREG) 3.125 MG tablet Take 1 tablet (3.125 mg total) by mouth 2 (two) times daily with a meal. 180 tablet 0   clonazePAM (KLONOPIN) 0.5 MG tablet TAKE 1 TABLET BY MOUTH THREE TIMES DAILY AS NEEDED FOR ANXIETY 90 tablet 0   cyclobenzaprine (FLEXERIL) 10 MG tablet TAKE ONE TABLET BY MOUTH UP TO THREE TIMES DAILY 90 tablet 0   gabapentin (NEURONTIN) 300 MG capsule Take 1 capsule by mouth at bedtime 90 capsule 0   glucose blood (ACCU-CHEK GUIDE) test strip 1 each by Other route 2 (two) times daily. Use as instructed 200 strip 2   metFORMIN (GLUCOPHAGE XR) 750 MG 24 hr tablet Take 1 tablet (750 mg total) by mouth daily with breakfast. 90 tablet 1   potassium chloride SA (KLOR-CON M) 20 MEQ tablet Take 1 tablet by mouth twice daily 180 tablet 0   rosuvastatin (CRESTOR) 20 MG tablet Take 1 tablet (20 mg total) by mouth daily. 90 tablet 1   spironolactone (ALDACTONE) 50 MG tablet Take 1 tablet by mouth once daily 90 tablet 0   No facility-administered medications prior to visit.    ROS Review of Systems   Constitutional:  Negative for chills, diaphoresis, fatigue and fever.  HENT:  Positive for postnasal drip, rhinorrhea, sinus pressure, sinus pain and sneezing. Negative for facial swelling, nosebleeds, sore throat and trouble swallowing.   Eyes: Negative.   Respiratory:  Positive for cough and wheezing. Negative for chest tightness and shortness of breath.   Cardiovascular:  Negative for chest pain, palpitations and leg swelling.  Gastrointestinal:  Negative for abdominal pain, constipation, diarrhea, nausea and vomiting.  Endocrine: Negative.   Genitourinary: Negative.  Negative for difficulty urinating.  Musculoskeletal: Negative.   Skin: Negative.   Neurological: Negative.  Negative for dizziness, weakness and light-headedness.  Hematological:  Negative for adenopathy. Does not bruise/bleed easily.  Psychiatric/Behavioral: Negative.     Objective:  BP 122/76 (BP Location: Left Arm, Patient Position: Sitting, Cuff Size: Large)   Pulse 85   Temp 98.6 F (37 C) (Oral)   Ht '5\' 2"'  (1.575 m)   Wt 145 lb (65.8 kg)   SpO2 98%   BMI 26.52 kg/m   BP Readings from Last 3 Encounters:  02/04/22 122/76  12/24/21 (!) 144/98  07/22/21 (!) 142/78    Wt Readings from Last 3 Encounters:  02/04/22 145 lb (65.8 kg)  12/24/21 142 lb (64.4 kg)  07/22/21 146 lb (66.2 kg)    Physical Exam Vitals reviewed.  Constitutional:  General: She is not in acute distress.    Appearance: She is not ill-appearing, toxic-appearing or diaphoretic.  HENT:     Nose: Rhinorrhea present. Rhinorrhea is purulent.     Mouth/Throat:     Mouth: Mucous membranes are moist.  Eyes:     General: No scleral icterus.    Conjunctiva/sclera: Conjunctivae normal.  Cardiovascular:     Rate and Rhythm: Normal rate and regular rhythm.     Heart sounds: No murmur heard. Pulmonary:     Effort: Pulmonary effort is normal.     Breath sounds: No stridor. No wheezing, rhonchi or rales.  Abdominal:     General: Abdomen  is flat.     Palpations: There is no mass.     Tenderness: There is no abdominal tenderness. There is no guarding.     Hernia: No hernia is present.  Musculoskeletal:        General: Normal range of motion.     Cervical back: Neck supple.     Right lower leg: No edema.     Left lower leg: No edema.  Lymphadenopathy:     Cervical: No cervical adenopathy.  Skin:    General: Skin is warm and dry.     Coloration: Skin is not pale.  Neurological:     General: No focal deficit present.     Mental Status: She is alert. Mental status is at baseline.  Psychiatric:        Mood and Affect: Mood normal.        Behavior: Behavior normal.    Lab Results  Component Value Date   WBC 5.0 12/24/2021   HGB 16.6 (H) 12/24/2021   HCT 49.4 (H) 12/24/2021   PLT 233.0 12/24/2021   GLUCOSE 100 (H) 12/24/2021   CHOL 286 (H) 12/24/2021   TRIG 136.0 12/24/2021   HDL 47.00 12/24/2021   LDLCALC 212 (H) 12/24/2021   ALT 13 07/10/2020   AST 16 07/10/2020   NA 137 12/24/2021   K 4.4 12/24/2021   CL 101 12/24/2021   CREATININE 0.75 12/24/2021   BUN 18 12/24/2021   CO2 27 12/24/2021   TSH 1.18 07/10/2020   PSA WNL 10/30/2015   INR 1.1 (H) 12/31/2015   HGBA1C 6.3 12/24/2021   MICROALBUR 2.0 (H) 12/24/2021    VAS [place] ABI WITH/WO TBI  Result Date: 12/25/2021  LOWER EXTREMITY DOPPLER STUDY Patient Name:  Julie Rivas  Date of Exam:   12/25/2021 Medical Rec #: [id number]         Accession #:    [id number] Date of Birth: 1956/08/29          Patient Gender: F Patient Age:   65 years Exam Location:  [facility] Procedure:      VAS [place] ABI WITH/WO TBI Referring Phys: Scarlette Calico --------------------------------------------------------------------------------  Indications: Decreased pulses. High Risk Factors: Hypertension, hyperlipidemia, Diabetes, no history of                    smoking.  Performing Technologist: Alvia Grove RVT  Examination Guidelines: A complete evaluation includes at  minimum, Doppler waveform signals and systolic blood pressure reading at the level of bilateral brachial, anterior tibial, and posterior tibial arteries, when vessel segments are accessible. Bilateral testing is considered an integral part of a complete examination. Photoelectric Plethysmograph (PPG) waveforms and toe systolic pressure readings are included as required and additional duplex testing as needed. Limited examinations for reoccurring indications may be performed as  noted.  ABI Findings: +---------+------------------+-----+--------+--------+ Right    Rt Pressure (mmHg)IndexWaveformComment  +---------+------------------+-----+--------+--------+ Brachial 129                                     +---------+------------------+-----+--------+--------+ PTA      150               1.16 biphasic         +---------+------------------+-----+--------+--------+ DP       143               1.11 biphasic         +---------+------------------+-----+--------+--------+ Great Toe90                0.70 Normal           +---------+------------------+-----+--------+--------+ +---------+------------------+-----+--------+-------+ Left     Lt Pressure (mmHg)IndexWaveformComment +---------+------------------+-----+--------+-------+ Brachial 128                                    +---------+------------------+-----+--------+-------+ PTA      138               1.07 biphasic        +---------+------------------+-----+--------+-------+ DP       126               0.98 biphasic        +---------+------------------+-----+--------+-------+ Adair Patter               1.03 Normal          +---------+------------------+-----+--------+-------+ +-------+-----------+-----------+------------+------------+ ABI/TBIToday's ABIToday's TBIPrevious ABIPrevious TBI +-------+-----------+-----------+------------+------------+ Right  1.16       0.70                                 +-------+-----------+-----------+------------+------------+ Left   1.07       1.03                                +-------+-----------+-----------+------------+------------+   Summary: Right: Resting right ankle-brachial index is within normal range. No evidence of significant right lower extremity arterial disease. Left: Resting left ankle-brachial index is within normal range. No evidence of significant left lower extremity arterial disease. *See table(s) above for measurements and observations.  Electronically signed by Deitra Mayo MD on 12/25/2021 at 1:47:50 PM.    Final     Assessment & Plan:   Julie Rivas was seen today for asthma and sinusitis.  Diagnoses and all orders for this visit:  Essential hypertension- Her BP is well controlled.  Moderate persistent asthma with acute exacerbation -     methylPREDNISolone acetate (DEPO-MEDROL) injection 120 mg -     montelukast (SINGULAIR) 10 MG tablet; Take 1 tablet (10 mg total) by mouth at bedtime.  Acute non-recurrent maxillary sinusitis -     amoxicillin-clavulanate (AUGMENTIN) 875-125 MG tablet; Take 1 tablet by mouth 2 (two) times daily for 7 days. -     HYDROcodone bit-homatropine (HYCODAN) 5-1.5 MG/5ML syrup; Take 5 mLs by mouth every 8 (eight) hours as needed for up to 8 days for cough.  Moderate persistent asthma without complication -     montelukast (SINGULAIR) 10 MG tablet; Take 1 tablet (10 mg total) by mouth at bedtime. -  HYDROcodone bit-homatropine (HYCODAN) 5-1.5 MG/5ML syrup; Take 5 mLs by mouth every 8 (eight) hours as needed for up to 8 days for cough.   I am having Julie Rivas start on amoxicillin-clavulanate, montelukast, and HYDROcodone bit-homatropine. I am also having her maintain her gabapentin, albuterol, metFORMIN, spironolactone, budesonide-formoterol, Accu-Chek Guide Me, Accu-Chek Guide, carvedilol, rosuvastatin, potassium chloride SA, cyclobenzaprine, and clonazePAM. We administered  methylPREDNISolone acetate.  Meds ordered this encounter  Medications   methylPREDNISolone acetate (DEPO-MEDROL) injection 120 mg   amoxicillin-clavulanate (AUGMENTIN) 875-125 MG tablet    Sig: Take 1 tablet by mouth 2 (two) times daily for 7 days.    Dispense:  14 tablet    Refill:  0   montelukast (SINGULAIR) 10 MG tablet    Sig: Take 1 tablet (10 mg total) by mouth at bedtime.    Dispense:  90 tablet    Refill:  1   HYDROcodone bit-homatropine (HYCODAN) 5-1.5 MG/5ML syrup    Sig: Take 5 mLs by mouth every 8 (eight) hours as needed for up to 8 days for cough.    Dispense:  120 mL    Refill:  0     Follow-up: Return in about 3 weeks (around 02/25/2022).  Scarlette Calico, MD

## 2022-02-04 NOTE — Telephone Encounter (Signed)
Pt scheduled today 5/31 @ 4pm

## 2022-02-04 NOTE — Patient Instructions (Signed)

## 2022-02-11 LAB — HM DIABETES EYE EXAM

## 2022-02-17 ENCOUNTER — Other Ambulatory Visit: Payer: Self-pay | Admitting: Internal Medicine

## 2022-02-17 DIAGNOSIS — I10 Essential (primary) hypertension: Secondary | ICD-10-CM

## 2022-02-17 DIAGNOSIS — E876 Hypokalemia: Secondary | ICD-10-CM

## 2022-02-18 ENCOUNTER — Other Ambulatory Visit: Payer: Self-pay | Admitting: Internal Medicine

## 2022-04-27 ENCOUNTER — Telehealth: Payer: Self-pay | Admitting: Internal Medicine

## 2022-04-27 MED ORDER — BLOOD GLUCOSE METER KIT: PACK | 0 refills | Status: AC

## 2022-04-27 NOTE — Telephone Encounter (Signed)
Pt is requesting rx for a glucose meter. She stated she changed from [facility] to [employer] insurance. [employer] only covers glucose meters and test strips if they are onetouch or freestyle libre. Pt is requesting rx for a glucose meter of either of those two brands.    Please advise.    Pharmacy [place], [place] Phone:  [phone]  Fax:  [phone]

## 2022-04-30 IMAGING — DX DG CHEST 2V
2 series · 2 of 2 positions shown · non-contrast
Comparison: X-ray chest 04/22/2021.

CLINICAL DATA: Intermittent productive cough and dyspnea on
exertion for 1 month. No known injury. History of hypertension,
asthma and bronchitis.

EXAM:
CHEST - 2 VIEW

[chest pa]
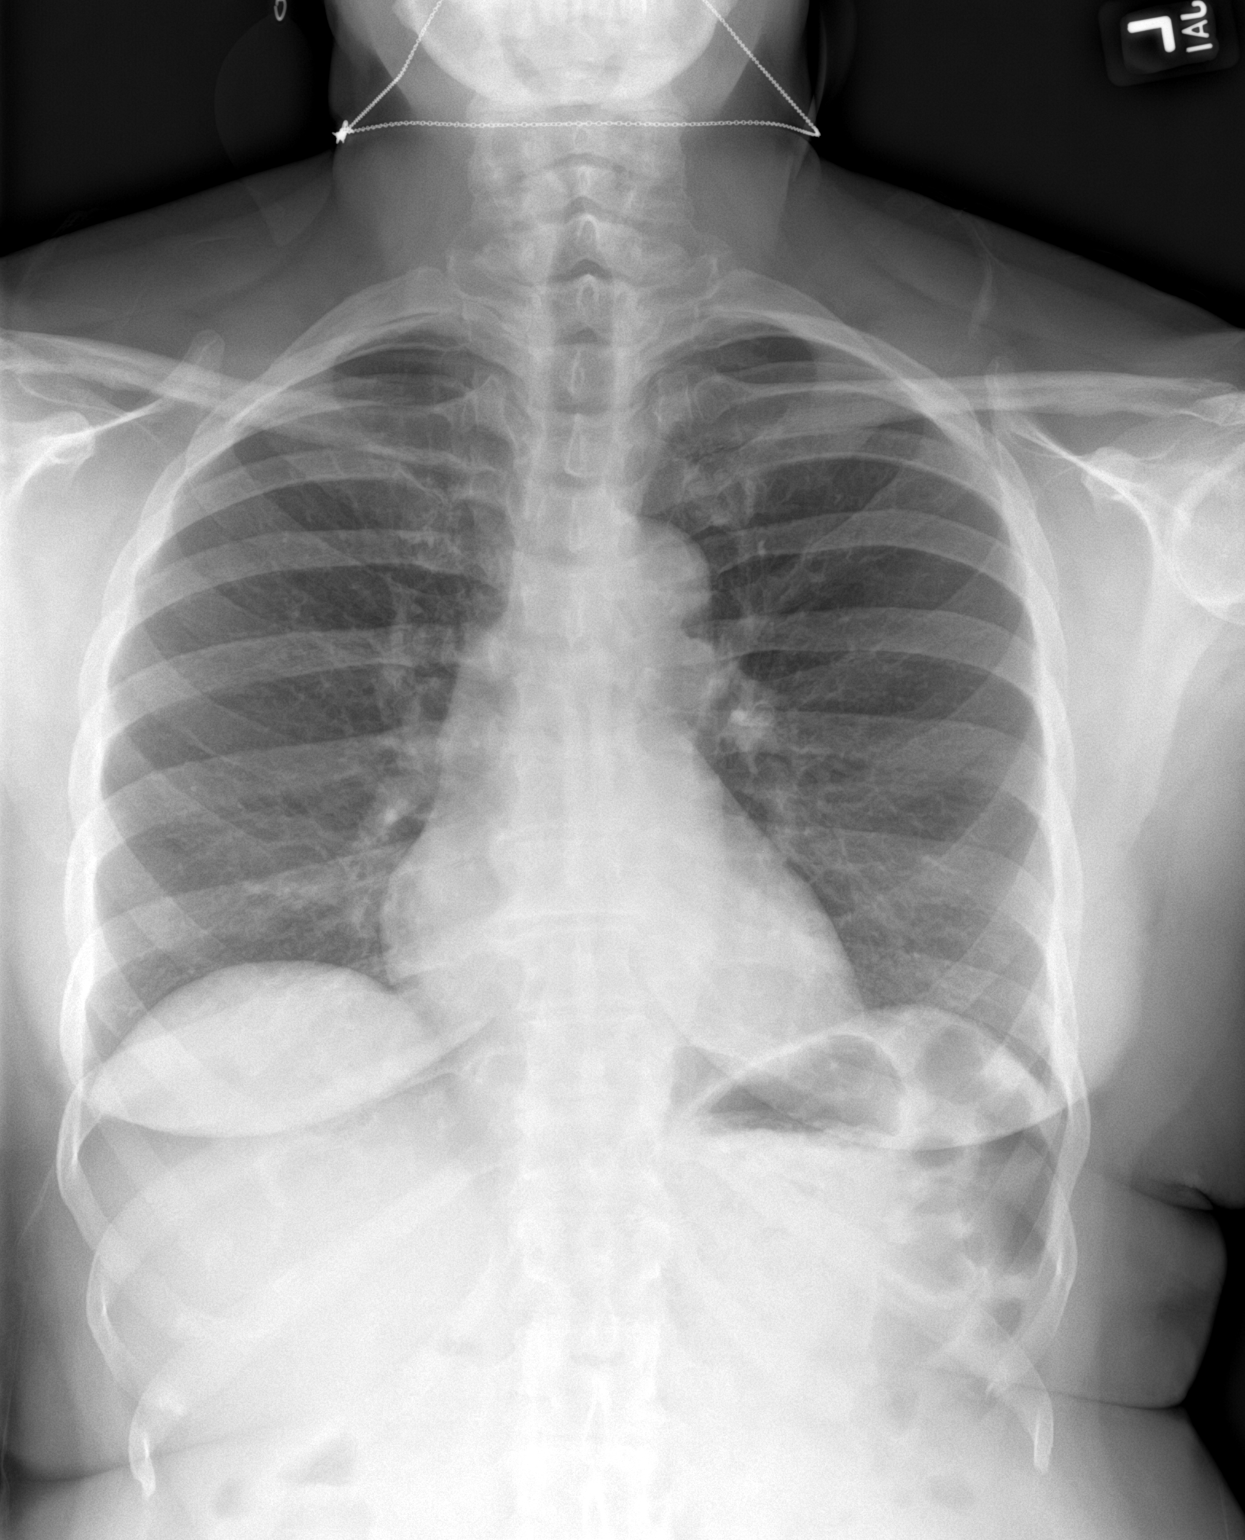

[chest lat]
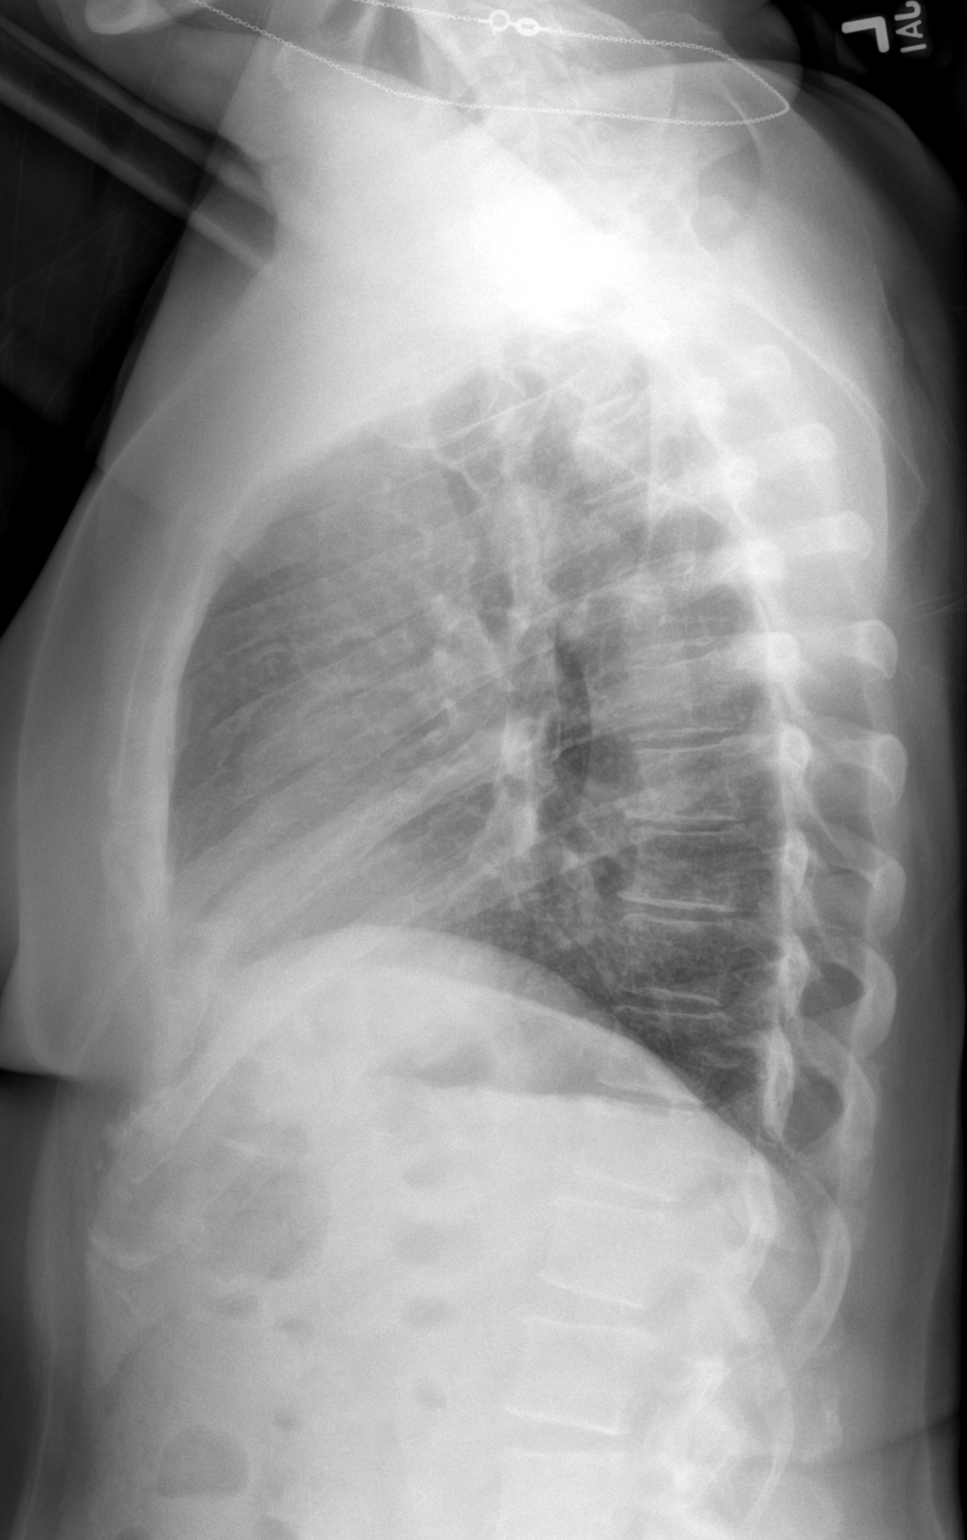

[2 of 2 positions shown; findings below may reference images not displayed]

FINDINGS: The heart size and mediastinal contours are within normal limits. No
focal airspace consolidation, pleural effusion, or pneumothorax. The
visualized skeletal structures are unremarkable.
IMPRESSION: No active cardiopulmonary disease.

## 2022-05-08 ENCOUNTER — Telehealth: Payer: Self-pay | Admitting: Internal Medicine

## 2022-05-08 NOTE — Telephone Encounter (Signed)
Would like nurse to call her to discuss problems with blood sugar - offered patient appt. With another physician to get her in sooner and patient declined.

## 2022-05-12 NOTE — Telephone Encounter (Signed)
Pt has questions about her blood sugar running high with her machine at home. She believes that it may be inaccurate vs the reading she gets with her old machine purchased in April of this year.   She has been scheduled for an OV on 9/14 '@9'$ .20am for follow up from 5/31 visit.

## 2022-05-19 ENCOUNTER — Other Ambulatory Visit: Payer: Self-pay | Admitting: Internal Medicine

## 2022-05-19 DIAGNOSIS — E876 Hypokalemia: Secondary | ICD-10-CM

## 2022-05-19 DIAGNOSIS — I10 Essential (primary) hypertension: Secondary | ICD-10-CM

## 2022-05-20 ENCOUNTER — Other Ambulatory Visit: Payer: Self-pay | Admitting: Internal Medicine

## 2022-05-21 ENCOUNTER — Encounter: Payer: Self-pay | Admitting: Internal Medicine

## 2022-05-21 ENCOUNTER — Ambulatory Visit (INDEPENDENT_AMBULATORY_CARE_PROVIDER_SITE_OTHER): Payer: HMO | Admitting: Internal Medicine

## 2022-05-21 VITALS — BP 130/80 | HR 76 | Temp 98.1°F | Ht 62.0 in | Wt 143.0 lb

## 2022-05-21 DIAGNOSIS — G63 Polyneuropathy in diseases classified elsewhere: Secondary | ICD-10-CM

## 2022-05-21 DIAGNOSIS — E118 Type 2 diabetes mellitus with unspecified complications: Secondary | ICD-10-CM

## 2022-05-21 DIAGNOSIS — Z23 Encounter for immunization: Secondary | ICD-10-CM | POA: Diagnosis not present

## 2022-05-21 DIAGNOSIS — T502X5A Adverse effect of carbonic-anhydrase inhibitors, benzothiadiazides and other diuretics, initial encounter: Secondary | ICD-10-CM

## 2022-05-21 DIAGNOSIS — R9431 Abnormal electrocardiogram [ECG] [EKG]: Secondary | ICD-10-CM

## 2022-05-21 DIAGNOSIS — E876 Hypokalemia: Secondary | ICD-10-CM | POA: Diagnosis not present

## 2022-05-21 DIAGNOSIS — E785 Hyperlipidemia, unspecified: Secondary | ICD-10-CM

## 2022-05-21 DIAGNOSIS — I10 Essential (primary) hypertension: Secondary | ICD-10-CM

## 2022-05-21 DIAGNOSIS — Z124 Encounter for screening for malignant neoplasm of cervix: Secondary | ICD-10-CM

## 2022-05-21 DIAGNOSIS — E538 Deficiency of other specified B group vitamins: Secondary | ICD-10-CM | POA: Diagnosis not present

## 2022-05-21 LAB — HEPATIC FUNCTION PANEL
ALT: 14 U/L (ref 0–35)
AST: 19 U/L (ref 0–37)
Albumin: 3.9 g/dL (ref 3.5–5.2)
Alkaline Phosphatase: 78 U/L (ref 39–117)
Bilirubin, Direct: 0.1 mg/dL (ref 0.0–0.3)
Total Bilirubin: 0.5 mg/dL (ref 0.2–1.2)
Total Protein: 7 g/dL (ref 6.0–8.3)

## 2022-05-21 LAB — LIPID PANEL
Cholesterol: 161 mg/dL (ref 0–200)
HDL: 41.9 mg/dL (ref >39.00)
LDL Cholesterol: 100 mg/dL — ABNORMAL HIGH (ref 0–99)
NonHDL: 119.17
Total CHOL/HDL Ratio: 4
Triglycerides: 94 mg/dL (ref 0.0–149.0)
VLDL: 18.8 mg/dL (ref 0.0–40.0)

## 2022-05-21 LAB — CBC WITH DIFFERENTIAL/PLATELET
Basophils Absolute: 0 10*3/uL (ref 0.0–0.1)
Basophils Relative: 0.7 % (ref 0.0–3.0)
Eosinophils Absolute: 0.1 10*3/uL (ref 0.0–0.7)
Eosinophils Relative: 1.6 % (ref 0.0–5.0)
HCT: 40.1 % (ref 36.0–46.0)
Hemoglobin: 13.6 g/dL (ref 12.0–15.0)
Lymphocytes Relative: 38.1 % (ref 12.0–46.0)
Lymphs Abs: 2.2 10*3/uL (ref 0.7–4.0)
MCHC: 33.9 g/dL (ref 30.0–36.0)
MCV: 94.5 fl (ref 78.0–100.0)
Monocytes Absolute: 0.5 10*3/uL (ref 0.1–1.0)
Monocytes Relative: 8.4 % (ref 3.0–12.0)
Neutro Abs: 3 10*3/uL (ref 1.4–7.7)
Neutrophils Relative %: 51.2 % (ref 43.0–77.0)
Platelets: 214 10*3/uL (ref 150.0–400.0)
RBC: 4.24 Mil/uL (ref 3.87–5.11)
RDW: 12.6 % (ref 11.5–15.5)
WBC: 5.8 10*3/uL (ref 4.0–10.5)

## 2022-05-21 LAB — BASIC METABOLIC PANEL
BUN: 8 mg/dL (ref 6–23)
CO2: 32 mEq/L (ref 19–32)
Calcium: 9.2 mg/dL (ref 8.4–10.5)
Chloride: 104 mEq/L (ref 96–112)
Creatinine, Ser: 0.66 mg/dL (ref 0.40–1.20)
GFR: 90.22 mL/min (ref >60.00)
Glucose, Bld: 95 mg/dL (ref 70–99)
Potassium: 3.3 mEq/L — ABNORMAL LOW (ref 3.5–5.1)
Sodium: 142 mEq/L (ref 135–145)

## 2022-05-21 LAB — HEMOGLOBIN A1C: Hgb A1c MFr Bld: 6.1 % (ref 4.6–6.5)

## 2022-05-21 MED ORDER — POTASSIUM CHLORIDE CRYS ER 20 MEQ PO TBCR: 20.0000 meq | EXTENDED_RELEASE_TABLET | Freq: Two times a day (BID) | ORAL | 0 refills | Status: DC

## 2022-05-21 MED ORDER — SHINGRIX 50 MCG/0.5ML IM SUSR: 0.5000 mL | Freq: Once | INTRAMUSCULAR | 1 refills | Status: AC

## 2022-05-21 MED ORDER — ONETOUCH DELICA PLUS LANCET33G MISC: 1.0000 | Freq: Four times a day (QID) | 5 refills | Status: AC | PRN

## 2022-05-21 NOTE — Progress Notes (Signed)
Subjective:  Patient ID: Julie Rivas, female    DOB: 09-23-1955  Age: 66 y.o. MRN: [id number]  CC: Hypertension, Hyperlipidemia, and Diabetes   HPI IMOGEAN CIAMPA presents for f/up -  She is active and denies chest pain, shortness of breath, diaphoresis, or edema.  Outpatient Medications Prior to Visit  Medication Sig Dispense Refill   albuterol (PROVENTIL) (2.5 MG/3ML) 0.083% nebulizer solution Take 3 mLs (2.5 mg total) by nebulization every 6 (six) hours as needed for wheezing or shortness of breath. 150 mL 2   blood glucose meter kit and supplies Dispense based on patient and insurance preference. Use up to four times daily as directed. (FOR ICD-10 E10.9, E11.9). 1 each 0   budesonide-formoterol (SYMBICORT) 80-4.5 MCG/ACT inhaler Inhale 2 puffs into the lungs 2 (two) times daily. 30.6 g 1   carvedilol (COREG) 3.125 MG tablet Take 1 tablet (3.125 mg total) by mouth 2 (two) times daily with a meal. 180 tablet 0   clonazePAM (KLONOPIN) 0.5 MG tablet TAKE 1 TABLET BY MOUTH THREE TIMES DAILY AS NEEDED FOR ANXIETY 90 tablet 0   cyclobenzaprine (FLEXERIL) 10 MG tablet TAKE 1 TABLET BY MOUTH UP TO THREE TIMES DAILY 90 tablet 0   gabapentin (NEURONTIN) 300 MG capsule Take 1 capsule by mouth at bedtime 90 capsule 0   montelukast (SINGULAIR) 10 MG tablet Take 1 tablet (10 mg total) by mouth at bedtime. 90 tablet 1   ONETOUCH ULTRA test strip USE AS DIRECTED UP TO  4 TIMES DAILY TO CHECK BLOOD SUGARS 100 each 5   rosuvastatin (CRESTOR) 20 MG tablet Take 1 tablet (20 mg total) by mouth daily. 90 tablet 1   spironolactone (ALDACTONE) 50 MG tablet Take 1 tablet by mouth once daily 90 tablet 0   Lancets (ONETOUCH DELICA PLUS INOMVE72C) MISC USE AS DIRECTED UP TO 4 TIMES DAILY 100 each 5   metFORMIN (GLUCOPHAGE XR) 750 MG 24 hr tablet Take 1 tablet (750 mg total) by mouth daily with breakfast. 90 tablet 1   potassium chloride SA (KLOR-CON M) 20 MEQ tablet Take 1 tablet by mouth twice daily 180  tablet 0   No facility-administered medications prior to visit.    ROS Review of Systems  Constitutional: Negative.  Negative for appetite change, chills, diaphoresis and fatigue.  HENT: Negative.    Eyes: Negative.   Respiratory: Negative.  Negative for cough, chest tightness, shortness of breath and wheezing.   Cardiovascular:  Negative for chest pain, palpitations and leg swelling.  Gastrointestinal:  Negative for abdominal pain, constipation, diarrhea and vomiting.  Endocrine: Negative.   Genitourinary: Negative.  Negative for difficulty urinating and dysuria.  Musculoskeletal: Negative.  Negative for arthralgias, joint swelling and myalgias.  Skin: Negative.   Neurological:  Negative for dizziness, weakness and headaches.  Hematological:  Negative for adenopathy. Does not bruise/bleed easily.  Psychiatric/Behavioral: Negative.      Objective:  BP 130/80 (BP Location: Right Arm, Patient Position: Sitting, Cuff Size: Large)   Pulse 76   Temp 98.1 F (36.7 C) (Oral)   Ht '5\' 2"'  (1.575 m)   Wt 143 lb (64.9 kg)   SpO2 93%   BMI 26.16 kg/m   BP Readings from Last 3 Encounters:  05/21/22 130/80  02/04/22 122/76  12/24/21 (!) 144/98    Wt Readings from Last 3 Encounters:  05/21/22 143 lb (64.9 kg)  02/04/22 145 lb (65.8 kg)  12/24/21 142 lb (64.4 kg)    Physical Exam Vitals reviewed.  HENT:     Nose: Nose normal.     Mouth/Throat:     Mouth: Mucous membranes are moist.  Eyes:     General: No scleral icterus.    Conjunctiva/sclera: Conjunctivae normal.  Cardiovascular:     Rate and Rhythm: Normal rate and regular rhythm.     Heart sounds: No murmur heard. Pulmonary:     Effort: Pulmonary effort is normal.     Breath sounds: No stridor. No wheezing, rhonchi or rales.  Abdominal:     General: Abdomen is flat.     Palpations: There is no mass.     Tenderness: There is no abdominal tenderness. There is no guarding.     Hernia: No hernia is present.   Musculoskeletal:        General: Normal range of motion.     Cervical back: Neck supple.     Right lower leg: No edema.     Left lower leg: No edema.  Skin:    General: Skin is warm and dry.  Neurological:     General: No focal deficit present.     Mental Status: She is alert.     Lab Results  Component Value Date   WBC 5.8 05/21/2022   HGB 13.6 05/21/2022   HCT 40.1 05/21/2022   PLT 214.0 05/21/2022   GLUCOSE 95 05/21/2022   CHOL 161 05/21/2022   TRIG 94.0 05/21/2022   HDL 41.90 05/21/2022   LDLCALC 100 (H) 05/21/2022   ALT 14 05/21/2022   AST 19 05/21/2022   NA 142 05/21/2022   K 3.3 (L) 05/21/2022   CL 104 05/21/2022   CREATININE 0.66 05/21/2022   BUN 8 05/21/2022   CO2 32 05/21/2022   TSH 1.18 07/10/2020   PSA WNL 10/30/2015   INR 1.1 (H) 12/31/2015   HGBA1C 6.1 05/21/2022   MICROALBUR 2.0 (H) 12/24/2021    VAS [place] ABI WITH/WO TBI  Result Date: 12/25/2021  LOWER EXTREMITY DOPPLER STUDY Patient Name:  SHAMICA MOREE  Date of Exam:   12/25/2021 Medical Rec #: [id number]         Accession #:    [id number] Date of Birth: [birth year]          Patient Gender: F Patient Age:   42 years Exam Location:  [facility] Procedure:      VAS [place] ABI WITH/WO TBI Referring Phys: Scarlette Calico --------------------------------------------------------------------------------  Indications: Decreased pulses. High Risk Factors: Hypertension, hyperlipidemia, Diabetes, no history of                    smoking.  Performing Technologist: Alvia Grove RVT  Examination Guidelines: A complete evaluation includes at minimum, Doppler waveform signals and systolic blood pressure reading at the level of bilateral brachial, anterior tibial, and posterior tibial arteries, when vessel segments are accessible. Bilateral testing is considered an integral part of a complete examination. Photoelectric Plethysmograph (PPG) waveforms and toe systolic pressure readings are included as required and  additional duplex testing as needed. Limited examinations for reoccurring indications may be performed as noted.  ABI Findings: +---------+------------------+-----+--------+--------+ Right    Rt Pressure (mmHg)IndexWaveformComment  +---------+------------------+-----+--------+--------+ Brachial 129                                     +---------+------------------+-----+--------+--------+ PTA      150  1.16 biphasic         +---------+------------------+-----+--------+--------+ DP       143               1.11 biphasic         +---------+------------------+-----+--------+--------+ Great Toe90                0.70 Normal           +---------+------------------+-----+--------+--------+ +---------+------------------+-----+--------+-------+ Left     Lt Pressure (mmHg)IndexWaveformComment +---------+------------------+-----+--------+-------+ Brachial 128                                    +---------+------------------+-----+--------+-------+ PTA      138               1.07 biphasic        +---------+------------------+-----+--------+-------+ DP       126               0.98 biphasic        +---------+------------------+-----+--------+-------+ Adair Patter               1.03 Normal          +---------+------------------+-----+--------+-------+ +-------+-----------+-----------+------------+------------+ ABI/TBIToday's ABIToday's TBIPrevious ABIPrevious TBI +-------+-----------+-----------+------------+------------+ Right  1.16       0.70                                +-------+-----------+-----------+------------+------------+ Left   1.07       1.03                                +-------+-----------+-----------+------------+------------+   Summary: Right: Resting right ankle-brachial index is within normal range. No evidence of significant right lower extremity arterial disease. Left: Resting left ankle-brachial index is within normal  range. No evidence of significant left lower extremity arterial disease. *See table(s) above for measurements and observations.  Electronically signed by Deitra Mayo MD on 12/25/2021 at 1:47:50 PM.    Final     Assessment & Plan:   Ian was seen today for hypertension, hyperlipidemia and diabetes.  Diagnoses and all orders for this visit:  Influenza vaccine needed -     Flu Vaccine QUAD High Dose(Fluad)  Essential hypertension- Her blood pressure is adequately well controlled. -     Basic metabolic panel; Future -     Hepatic function panel; Future -     Hepatic function panel -     Basic metabolic panel -     potassium chloride SA (KLOR-CON M) 20 MEQ tablet; Take 1 tablet (20 mEq total) by mouth 2 (two) times daily.  Type II diabetes mellitus with manifestations ([place])- Her blood sugar is well controlled. -     Basic metabolic panel; Future -     Hemoglobin A1c; Future -     Lancets (ONETOUCH DELICA PLUS [id number]) MISC; Place 1 Act onto the skin 4 (four) times daily as needed. -     Hemoglobin A1c -     Basic metabolic panel  Vitamin Q46 deficiency neuropathy (HCC)- Her H/H are normal. -     CBC with Differential/Platelet; Future -     CBC with Differential/Platelet  Hyperlipidemia with target LDL less than 130- LDL goal achieved. Doing well on the statin  -     Lipid panel; Future -  Hepatic function panel; Future -     Hepatic function panel -     Lipid panel  Abnormal electrocardiogram (ECG) (EKG) -     CT CARDIAC SCORING (SELF PAY ONLY); Future  Abnormal electrocardiogram -     CT CARDIAC SCORING (SELF PAY ONLY); Future  Screening for cervical cancer -     Ambulatory referral to Gynecology  Diuretic-induced hypokalemia -     potassium chloride SA (KLOR-CON M) 20 MEQ tablet; Take 1 tablet (20 mEq total) by mouth 2 (two) times daily.  Other orders -     Zoster Vaccine Adjuvanted [facility]) injection; Inject 0.5 mLs into the muscle once for 1  dose.   I have discontinued Mikesha G. Mui's metFORMIN. I have also changed her OneTouch Delica Plus [id number] and potassium chloride SA. Additionally, I am having her start on Shingrix. Lastly, I am having her maintain her gabapentin, albuterol, budesonide-formoterol, carvedilol, rosuvastatin, clonazePAM, montelukast, cyclobenzaprine, blood glucose meter kit and supplies, spironolactone, and OneTouch Ultra.  Meds ordered this encounter  Medications   Lancets (ONETOUCH DELICA PLUS [id number]) MISC    Sig: Place 1 Act onto the skin 4 (four) times daily as needed.    Dispense:  100 each    Refill:  5   Zoster Vaccine Adjuvanted [facility]) injection    Sig: Inject 0.5 mLs into the muscle once for 1 dose.    Dispense:  0.5 mL    Refill:  1   potassium chloride SA (KLOR-CON M) 20 MEQ tablet    Sig: Take 1 tablet (20 mEq total) by mouth 2 (two) times daily.    Dispense:  180 tablet    Refill:  0     Follow-up: Return in about 3 months (around 08/20/2022).  Scarlette Calico, MD

## 2022-05-21 NOTE — Patient Instructions (Signed)
                                                                    [email or web address]                       

## 2022-05-25 ENCOUNTER — Encounter: Payer: Self-pay | Admitting: Internal Medicine

## 2022-06-01 ENCOUNTER — Encounter: Payer: Self-pay | Admitting: Internal Medicine

## 2022-06-09 NOTE — Patient Instructions (Signed)

## 2022-06-09 NOTE — Progress Notes (Unsigned)
Subjective:   Julie Rivas is a 66 y.o. female who presents for an Initial Medicare Annual Wellness Visit. I connected with  Julie Rivas on 06/10/22 by a audio enabled telemedicine application and verified that I am speaking with the correct person using two identifiers.  Patient Location: Home  Provider Location: Home Office  I discussed the limitations of evaluation and management by telemedicine. The patient expressed understanding and agreed to proceed.  Review of Systems    Deferred to PCP Cardiac Risk Factors include: advanced age (>54mn, >>7women);diabetes mellitus;dyslipidemia;hypertension     Objective:    Today's Vitals   06/10/22 1051  PainSc: 3    There is no height or weight on file to calculate BMI.     06/10/2022   11:02 AM  Advanced Directives  Does Patient Have a Medical Advance Directive? No  Would patient like information on creating a medical advance directive? No - Patient declined    Current Medications (verified) Outpatient Encounter Medications as of 06/10/2022  Medication Sig   albuterol (PROVENTIL) (2.5 MG/3ML) 0.083% nebulizer solution Take 3 mLs (2.5 mg total) by nebulization every 6 (six) hours as needed for wheezing or shortness of breath.   blood glucose meter kit and supplies Dispense based on patient and insurance preference. Use up to four times daily as directed. (FOR ICD-10 E10.9, E11.9).   budesonide-formoterol (SYMBICORT) 80-4.5 MCG/ACT inhaler Inhale 2 puffs into the lungs 2 (two) times daily.   carvedilol (COREG) 3.125 MG tablet Take 1 tablet (3.125 mg total) by mouth 2 (two) times daily with a meal.   clonazePAM (KLONOPIN) 0.5 MG tablet TAKE 1 TABLET BY MOUTH THREE TIMES DAILY AS NEEDED FOR ANXIETY   cyclobenzaprine (FLEXERIL) 10 MG tablet TAKE 1 TABLET BY MOUTH UP TO THREE TIMES DAILY   gabapentin (NEURONTIN) 300 MG capsule Take 1 capsule by mouth at bedtime   Lancets (ONETOUCH DELICA PLUS LFOYDXA12I MISC Place 1 Act onto  the skin 4 (four) times daily as needed.   montelukast (SINGULAIR) 10 MG tablet Take 1 tablet (10 mg total) by mouth at bedtime.   ONETOUCH ULTRA test strip USE AS DIRECTED UP TO  4 TIMES DAILY TO CHECK BLOOD SUGARS   potassium chloride SA (KLOR-CON M) 20 MEQ tablet Take 1 tablet (20 mEq total) by mouth 2 (two) times daily.   rosuvastatin (CRESTOR) 20 MG tablet Take 1 tablet (20 mg total) by mouth daily.   spironolactone (ALDACTONE) 50 MG tablet Take 1 tablet by mouth once daily   No facility-administered encounter medications on file as of 06/10/2022.    Allergies (verified) Codeine and Metformin and related   History: Past Medical History:  Diagnosis Date   Allergy    Anxiety    Arthritis    Asthma    Cataract    REMOVED BOTH EYES    Hyperlipidemia    Hypertension    Past Surgical History:  Procedure Laterality Date   COLONOSCOPY     ECTOPIC PREGNANCY SURGERY     has partial right fallopian tube   POLYPECTOMY     Family History  Problem Relation Age of Onset   Heart failure Mother    Arthritis Mother    Cancer Father    Hypertension Father    Diabetes Sister    Colon cancer Neg Hx    Esophageal cancer Neg Hx    Stomach cancer Neg Hx    Rectal cancer Neg Hx    Hyperlipidemia Neg Hx  Heart disease Neg Hx    Early death Neg Hx    Depression Neg Hx    Colon polyps Neg Hx    Social History   Socioeconomic History   Marital status: Married    Spouse name: Not on file   Number of children: Not on file   Years of education: Not on file   Highest education level: Not on file  Occupational History   Not on file  Tobacco Use   Smoking status: Never   Smokeless tobacco: Never  Vaping Use   Vaping Use: Never used  Substance and Sexual Activity   Alcohol use: No   Drug use: No   Sexual activity: Yes    Birth control/protection: Surgical  Other Topics Concern   Not on file  Social History Narrative   Not on file   Social Determinants of Health    Financial Resource Strain: Low Risk  (06/10/2022)   Overall Financial Resource Strain (CARDIA)    Difficulty of Paying Living Expenses: Not hard at all  Food Insecurity: No Food Insecurity (06/10/2022)   Hunger Vital Sign    Worried About Running Out of Food in the Last Year: Never true    [place] in the Last Year: Never true  Transportation Needs: No Transportation Needs (06/10/2022)   PRAPARE - Hydrologist (Medical): No    Lack of Transportation (Non-Medical): No  Physical Activity: Sufficiently Active (06/10/2022)   Exercise Vital Sign    Days of Exercise per Week: 7 days    Minutes of Exercise per Session: 30 min  Stress: No Stress Concern Present (06/10/2022)   [place]    Feeling of Stress : Only a little  Social Connections: Socially Integrated (06/10/2022)   Social Connection and Isolation Panel [NHANES]    Frequency of Communication with Friends and Family: More than three times a week    Frequency of Social Gatherings with Friends and Family: More than three times a week    Attends Religious Services: More than 4 times per year    Active Member of [employer] or Organizations: Yes    Attends Music therapist: More than 4 times per year    Marital Status: Married    Tobacco Counseling Counseling given: Not Answered   Clinical Intake:  Pre-visit preparation completed: Yes  Pain : 0-10 Pain Score: 3  Pain Type: Chronic pain Pain Location: Back Pain Orientation: Lower Pain Descriptors / Indicators: Aching, Discomfort     Nutritional Status: BMI > 30  Obese Nutritional Risks: None Diabetes: Yes CBG done?: No (phone visit) Did pt. bring in CBG monitor from home?: No  How often do you need to have someone help you when you read instructions, pamphlets, or other written materials from your doctor or pharmacy?: 1 - Never What is the last grade level you  completed in school?: 12  Diabetic?Yes Nutrition Risk Assessment:  Has the patient had any N/V/D within the last 2 months?  No  Does the patient have any non-healing wounds?  No  Has the patient had any unintentional weight loss or weight gain?  No   Diabetes:  Is the patient diabetic?  Yes  If diabetic, was a CBG obtained today?  No  Did the patient bring in their glucometer from home?  No  How often do you monitor your CBG's? 2-3 times per times.   Financial Strains and Diabetes  Management:  Are you having any financial strains with the device, your supplies or your medication? No .  Does the patient want to be seen by Chronic Care Management for management of their diabetes?  No  Would the patient like to be referred to a Nutritionist or for Diabetic Management?  No   Diabetic Exams:  Diabetic Eye Exam: Completed 02/11/22 Diabetic Foot Exam: Completed 12/24/21   Interpreter Needed?: No  Information entered by :: Emelia Loron RN   Activities of Daily Living    06/10/2022   11:01 AM 07/16/2021    3:48 PM  In your present state of health, do you have any difficulty performing the following activities:  Hearing? 0 0  Vision? 0 0  Difficulty concentrating or making decisions? 0 0  Walking or climbing stairs? 0 0  Dressing or bathing? 0 0  Doing errands, shopping? 0 0  Preparing Food and eating ? N   Using the Toilet? N   In the past six months, have you accidently leaked urine? N   Do you have problems with loss of bowel control? N   Managing your Medications? N   Managing your Finances? N   Housekeeping or managing your Housekeeping? N     Patient Care Team: Janith Lima, MD as PCP - General (Internal Medicine)  Indicate any recent Medical Services you may have received from other than [facility] providers in the past year (date may be approximate).     Assessment:   This is a routine wellness examination for Julie Rivas.  Hearing/Vision screen No results  found.  Dietary issues and exercise activities discussed: Current Exercise Habits: Home exercise routine, Type of exercise: walking, Time (Minutes): 30, Frequency (Times/Week): 7, Weekly Exercise (Minutes/Week): 210, Intensity: Mild, Exercise limited by: orthopedic condition(s)   Goals Addressed             This Visit's Progress    Patient Stated       Lose Weight; I want to lose about 10 pounds.      Depression Screen    06/10/2022   10:56 AM 12/25/2019    1:16 PM 09/21/2018   11:43 AM  PHQ 2/9 Scores  PHQ - 2 Score 0 0 0    Fall Risk    05/21/2022    9:36 AM 04/21/2021   10:29 AM 01/08/2020   10:42 AM 01/08/2020   10:41 AM 12/25/2019    1:16 PM  Fall Risk   Falls in the past year? 0 0  0   Number falls in past yr: 0   0 0  Injury with Fall? 0   0 0  Risk for fall due to : No Fall Risks  No Fall Risks No Fall Risks No Fall Risks  Follow up Falls evaluation completed  Falls evaluation completed  Falls evaluation completed    FALL RISK PREVENTION PERTAINING TO THE HOME:  Any stairs in or around the home? Yes  If so, are there any without handrails? Yes  Home free of loose throw rugs in walkways, pet beds, electrical cords, etc? Yes  Adequate lighting in your home to reduce risk of falls? Yes   ASSISTIVE DEVICES UTILIZED TO PREVENT FALLS:  Life alert? No  Use of a cane, walker or w/c? Yes  Grab bars in the bathroom? No  Shower chair or bench in shower? No  Elevated toilet seat or a handicapped toilet? No   Cognitive Function:        06/10/2022  11:02 AM  6CIT Screen  What Year? 0 points  What month? 0 points  What time? 0 points  Count back from 20 0 points  Months in reverse 0 points  Repeat phrase 0 points  Total Score 0 points    Immunizations Immunization History  Administered Date(s) Administered   Fluad Quad(high Dose 65+) 07/10/2020, 07/16/2021, 05/21/2022   Influenza,inj,Quad PF,6+ Mos 07/01/2016, 07/04/2018   PFIZER(Purple Top)SARS-COV-2  Vaccination 10/29/2019, 11/22/2019   Pneumococcal Conjugate-13 02/06/2020   Pneumococcal Polysaccharide-23 10/30/2015, 12/24/2021   Tdap 02/06/2020   Zoster, Live 08/07/2014    TDAP status: Up to date  Flu Vaccine status: Up to date  Pneumococcal vaccine status: Up to date  Covid-19 vaccine status: Information provided on how to obtain vaccines.   Qualifies for Shingles Vaccine? Yes   Zostavax completed No   Shingrix Completed?: No.    Education has been provided regarding the importance of this vaccine. Patient has been advised to call insurance company to determine out of pocket expense if they have not yet received this vaccine. Advised may also receive vaccine at local pharmacy or Health Dept. Verbalized acceptance and understanding.  Screening Tests Health Maintenance  Topic Date Due   Zoster Vaccines- Shingrix (1 of 2) Never done   DEXA SCAN  Never done   HEMOGLOBIN A1C  11/19/2022   Diabetic kidney evaluation - Urine ACR  12/25/2022   FOOT EXAM  12/25/2022   OPHTHALMOLOGY EXAM  02/12/2023   Diabetic kidney evaluation - GFR measurement  05/22/2023   MAMMOGRAM  08/29/2023   COLONOSCOPY (Pts 45-68yr Insurance coverage will need to be confirmed)  01/18/2028   TETANUS/TDAP  02/05/2030   Pneumonia Vaccine 68 Years old  Completed   INFLUENZA VACCINE  Completed   Hepatitis C Screening  Completed   HPV VACCINES  Aged Out   COVID-19 Vaccine  Discontinued    Health Maintenance  Health Maintenance Due  Topic Date Due   Zoster Vaccines- Shingrix (1 of 2) Never done   DEXA SCAN  Never done    Colorectal cancer screening: Type of screening: Colonoscopy. Completed 01/17/21. Repeat every 7 years  Mammogram status: Completed 08/28/21. Repeat every year  Bone Density status: [facility]. Pt provided with contact info and advised to call to schedule appt.  Lung Cancer Screening: (Low Dose CT Chest recommended if Age 68-80years, 30 pack-year currently smoking OR  have quit w/in 15years.) does not qualify.   Additional Screening:  Hepatitis C Screening: does qualify; Completed 10/30/15  Vision Screening: Recommended annual ophthalmology exams for early detection of glaucoma and other disorders of the eye. Is the patient up to date with their annual eye exam?  Yes  Who is the provider or what is the name of the office in which the patient attends annual eye exams? [facility] If pt is not established with a provider, would they like to be referred to a provider to establish care?  N/A .   Dental Screening: Recommended annual dental exams for proper oral hygiene  Community Resource Referral / Chronic Care Management: CRR required this visit?  No   CCM required this visit?  No      Plan:     I have personally reviewed and noted the following in the patient's chart:   Medical and social history Use of alcohol, tobacco or illicit drugs  Current medications and supplements including opioid prescriptions. Patient is not currently taking opioid prescriptions. Functional ability and status Nutritional status  Physical activity Advanced directives List of other physicians Hospitalizations, surgeries, and ER visits in previous 12 months Vitals Screenings to include cognitive, depression, and falls Referrals and appointments  In addition, I have reviewed and discussed with patient certain preventive protocols, quality metrics, and best practice recommendations. A written personalized care plan for preventive services as well as general preventive health recommendations were provided to patient.     Michiel Cowboy, RN   06/10/2022   Nurse Notes:  Julie Rivas , Thank you for taking time to come for your Medicare Wellness Visit. I appreciate your ongoing commitment to your health goals. Please review the following plan we discussed and let me know if I can assist you in the future.   These are the goals we discussed:  Goals       Patient Stated     Lose Weight; I want to lose about 10 pounds.        This is a list of the screening recommended for you and due dates:  Health Maintenance  Topic Date Due   Zoster (Shingles) Vaccine (1 of 2) Never done   DEXA scan (bone density measurement)  Never done   Hemoglobin A1C  11/19/2022   Yearly kidney health urinalysis for diabetes  12/25/2022   Complete foot exam   12/25/2022   Eye exam for diabetics  02/12/2023   Yearly kidney function blood test for diabetes  05/22/2023   Mammogram  08/29/2023   Colon Cancer Screening  01/18/2028   Tetanus Vaccine  02/05/2030   Pneumonia Vaccine  Completed   Flu Shot  Completed   Hepatitis C Screening: USPSTF Recommendation to screen - Ages 18-79 yo.  Completed   HPV Vaccine  Aged Out   COVID-19 Vaccine  Discontinued

## 2022-06-10 ENCOUNTER — Ambulatory Visit (INDEPENDENT_AMBULATORY_CARE_PROVIDER_SITE_OTHER): Payer: HMO | Admitting: *Deleted

## 2022-06-10 DIAGNOSIS — Z0000 Encounter for general adult medical examination without abnormal findings: Secondary | ICD-10-CM

## 2022-06-10 DIAGNOSIS — E2839 Other primary ovarian failure: Secondary | ICD-10-CM | POA: Diagnosis not present

## 2022-07-01 ENCOUNTER — Other Ambulatory Visit: Payer: Self-pay | Admitting: Internal Medicine

## 2022-07-01 DIAGNOSIS — Z1231 Encounter for screening mammogram for malignant neoplasm of breast: Secondary | ICD-10-CM

## 2022-07-13 ENCOUNTER — Ambulatory Visit (INDEPENDENT_AMBULATORY_CARE_PROVIDER_SITE_OTHER): Payer: HMO

## 2022-07-13 ENCOUNTER — Encounter: Payer: Self-pay | Admitting: Internal Medicine

## 2022-07-13 ENCOUNTER — Ambulatory Visit (INDEPENDENT_AMBULATORY_CARE_PROVIDER_SITE_OTHER): Payer: HMO | Admitting: Internal Medicine

## 2022-07-13 VITALS — BP 124/78 | HR 81 | Temp 98.3°F | Resp 16 | Ht 62.0 in | Wt 141.0 lb

## 2022-07-13 DIAGNOSIS — R059 Cough, unspecified: Secondary | ICD-10-CM | POA: Diagnosis not present

## 2022-07-13 DIAGNOSIS — R509 Fever, unspecified: Secondary | ICD-10-CM | POA: Diagnosis not present

## 2022-07-13 DIAGNOSIS — R051 Acute cough: Secondary | ICD-10-CM | POA: Insufficient documentation

## 2022-07-13 DIAGNOSIS — J069 Acute upper respiratory infection, unspecified: Secondary | ICD-10-CM

## 2022-07-13 LAB — POC INFLUENZA A&B (BINAX/QUICKVUE)
Influenza A, POC: NEGATIVE
Influenza B, POC: NEGATIVE

## 2022-07-13 LAB — POC COVID19 BINAXNOW: SARS Coronavirus 2 Ag: NEGATIVE

## 2022-07-13 MED ORDER — HYDROCODONE BIT-HOMATROP MBR 5-1.5 MG/5ML PO SOLN: 5.0000 mL | Freq: Three times a day (TID) | ORAL | 0 refills | Status: AC | PRN

## 2022-07-13 NOTE — Patient Instructions (Signed)
Viral Respiratory Infection A respiratory infection is an illness that affects part of the respiratory system, such as the lungs, nose, or throat. A respiratory infection that is caused by a virus is called a viral respiratory infection. Common types of viral respiratory infections include: A cold. The flu (influenza). A respiratory syncytial virus (RSV) infection. What are the causes? This condition is caused by a virus. The virus may spread through contact with droplets or direct contact with infected people or their mucus or secretions. The virus may spread from person to person (is contagious). What are the signs or symptoms? Symptoms of this condition include: A stuffy or runny nose. A sore throat or cough. Shortness of breath or difficulty breathing. Yellow or green mucus (sputum). Other symptoms may include: A fever. Sweating or chills. Fatigue. Achy muscles. A headache. How is this diagnosed? This condition may be diagnosed based on: Your symptoms. A physical exam. Testing of secretions from the nose or throat. Chest X-ray. How is this treated? This condition may be treated with medicines, such as: Antiviral medicine. This may shorten the length of time a person has symptoms. Expectorants. These make it easier to cough up mucus. Decongestant nasal sprays. Acetaminophen or NSAIDs, such as ibuprofen, to relieve fever and pain. Antibiotic medicines are not prescribed for viral infections.This is because antibiotics are designed to kill bacteria. They do not kill viruses. Follow these instructions at home: Managing pain and congestion Take over-the-counter and prescription medicines only as told by your health care provider. If you have a sore throat, gargle with a mixture of salt and water 3-4 times a day or as needed. To make salt water, completely dissolve -1 tsp (3-6 g) of salt in 1 cup (237 mL) of warm water. Use nose drops made from salt water to ease congestion and  soften raw skin around your nose. Take 2 tsp (10 mL) of honey at bedtime to lessen coughing at night. Do not give honey to children who are younger than 1 year. Drink enough fluid to keep your urine pale yellow. This helps prevent dehydration and helps loosen up mucus. General instructions  Rest as much as possible. Do not drink alcohol. Do not use any products that contain nicotine or tobacco. These products include cigarettes, chewing tobacco, and vaping devices, such as e-cigarettes. If you need help quitting, ask your health care provider. Keep all follow-up visits. This is important. How is this prevented?     Get an annual flu shot. You may get the flu shot in late summer, fall, or winter. Ask your health care provider when you should get your flu shot. Avoid spreading your infection to other people. If you are sick: Wash your hands with soap and water often, especially after you cough or sneeze. Wash for at least 20 seconds. If soap and water are not available, use alcohol-based hand sanitizer. Cover your mouth when you cough. Cover your nose and mouth when you sneeze. Do not share cups or eating utensils. Clean commonly used objects often. Clean commonly touched surfaces. Stay home from work or school as told by your health care provider. Avoid contact with people who are sick during cold and flu season. This is generally fall and winter. Contact a health care provider if: Your symptoms last for 10 days or longer. Your symptoms get worse over time. You have severe sinus pain in your face or forehead. The glands in your jaw or neck become very swollen. You have shortness of breath. Get   help right away if you: Feel pain or pressure in your chest. Have trouble breathing. Faint or feel like you will faint. Have severe and persistent vomiting. Feel confused or disoriented. These symptoms may represent a serious problem that is an emergency. Do not wait to see if the symptoms will  go away. Get medical help right away. Call your local emergency services (911 in the [place] Do not drive yourself to the hospital. Summary A respiratory infection is an illness that affects part of the respiratory system, such as the lungs, nose, or throat. A respiratory infection that is caused by a virus is called a viral respiratory infection. Common types of viral respiratory infections include a cold, influenza, and respiratory syncytial virus (RSV) infection. Symptoms of this condition include a stuffy or runny nose, cough, fatigue, achy muscles, sore throat, and fevers or chills. Antibiotic medicines are not prescribed for viral infections. This is because antibiotics are designed to kill bacteria. They are not effective against viruses. This information is not intended to replace advice given to you by your health care provider. Make sure you discuss any questions you have with your health care provider. Document Revised: 11/28/2020 Document Reviewed: 11/28/2020 Elsevier Patient Education  2023 [employer]  

## 2022-07-13 NOTE — Progress Notes (Unsigned)
Subjective:  Patient ID: Julie Rivas, female    DOB: 09-23-1955  Age: 66 y.o. MRN: [id number]  CC: URI   HPI Julie Rivas presents for f/up -  She complains of 3-day history of sore throat, nonproductive cough, and low-grade fever.  Outpatient Medications Prior to Visit  Medication Sig Dispense Refill   albuterol (PROVENTIL) (2.5 MG/3ML) 0.083% nebulizer solution Take 3 mLs (2.5 mg total) by nebulization every 6 (six) hours as needed for wheezing or shortness of breath. 150 mL 2   blood glucose meter kit and supplies Dispense based on patient and insurance preference. Use up to four times daily as directed. (FOR ICD-10 E10.9, E11.9). 1 each 0   budesonide-formoterol (SYMBICORT) 80-4.5 MCG/ACT inhaler Inhale 2 puffs into the lungs 2 (two) times daily. 30.6 g 1   carvedilol (COREG) 3.125 MG tablet Take 1 tablet (3.125 mg total) by mouth 2 (two) times daily with a meal. 180 tablet 0   clonazePAM (KLONOPIN) 0.5 MG tablet TAKE 1 TABLET BY MOUTH THREE TIMES DAILY AS NEEDED FOR ANXIETY 90 tablet 0   cyclobenzaprine (FLEXERIL) 10 MG tablet TAKE 1 TABLET BY MOUTH UP TO THREE TIMES DAILY 90 tablet 0   gabapentin (NEURONTIN) 300 MG capsule Take 1 capsule by mouth at bedtime 90 capsule 0   Lancets (ONETOUCH DELICA PLUS [id number]) MISC Place 1 Act onto the skin 4 (four) times daily as needed. 100 each 5   montelukast (SINGULAIR) 10 MG tablet Take 1 tablet (10 mg total) by mouth at bedtime. 90 tablet 1   ONETOUCH ULTRA test strip USE AS DIRECTED UP TO  4 TIMES DAILY TO CHECK BLOOD SUGARS 100 each 5   potassium chloride SA (KLOR-CON M) 20 MEQ tablet Take 1 tablet (20 mEq total) by mouth 2 (two) times daily. 180 tablet 0   rosuvastatin (CRESTOR) 20 MG tablet Take 1 tablet (20 mg total) by mouth daily. 90 tablet 1   spironolactone (ALDACTONE) 50 MG tablet Take 1 tablet by mouth once daily 90 tablet 0   No facility-administered medications prior to visit.    ROS Review of Systems   Constitutional:  Positive for fever. Negative for appetite change, chills, diaphoresis and fatigue.  HENT: Negative.  Negative for sore throat.   Eyes: Negative.   Respiratory:  Positive for cough. Negative for chest tightness, shortness of breath and wheezing.   Cardiovascular:  Negative for chest pain, palpitations and leg swelling.  Gastrointestinal:  Negative for abdominal pain, diarrhea, nausea and vomiting.  Endocrine: Negative.   Genitourinary: Negative.  Negative for difficulty urinating.  Musculoskeletal: Negative.   Skin: Negative.  Negative for rash.  Neurological: Negative.  Negative for dizziness and weakness.  Hematological:  Negative for adenopathy. Does not bruise/bleed easily.  Psychiatric/Behavioral: Negative.      Objective:  BP 124/78 (BP Location: Right Arm, Patient Position: Sitting, Cuff Size: Large)   Pulse 81   Temp 98.3 F (36.8 C) (Oral)   Resp 16   Ht _0  (1.575 m)   Wt 141 lb (64 kg)   SpO2 97%   BMI 25.79 kg/m   BP Readings from Last 3 Encounters:  07/13/22 124/78  05/21/22 130/80  02/04/22 122/76    Wt Readings from Last 3 Encounters:  07/13/22 141 lb (64 kg)  05/21/22 143 lb (64.9 kg)  02/04/22 145 lb (65.8 kg)    Physical Exam Vitals reviewed.  Constitutional:      General: She is not in acute distress.  Appearance: She is not toxic-appearing or diaphoretic.  HENT:     Nose: Nose normal.     Mouth/Throat:     Mouth: Mucous membranes are moist.     Pharynx: No pharyngeal swelling, oropharyngeal exudate, posterior oropharyngeal erythema or uvula swelling.     Tonsils: No tonsillar exudate or tonsillar abscesses.  Eyes:     General: No scleral icterus.    Conjunctiva/sclera: Conjunctivae normal.  Cardiovascular:     Rate and Rhythm: Normal rate and regular rhythm.     Heart sounds: No murmur heard. Pulmonary:     Effort: Pulmonary effort is normal. No tachypnea or accessory muscle usage.     Breath sounds: No stridor. No  decreased breath sounds, wheezing, rhonchi or rales.  Abdominal:     General: Abdomen is flat.     Palpations: There is no mass.     Tenderness: There is no abdominal tenderness. There is no guarding.     Hernia: No hernia is present.  Musculoskeletal:        General: Normal range of motion.     Cervical back: Neck supple.     Right lower leg: No edema.     Left lower leg: No edema.  Lymphadenopathy:     Cervical: No cervical adenopathy.  Skin:    General: Skin is warm and dry.  Neurological:     General: No focal deficit present.     Mental Status: She is alert.  Psychiatric:        Mood and Affect: Mood normal.        Behavior: Behavior normal.     Lab Results  Component Value Date   WBC 5.8 05/21/2022   HGB 13.6 05/21/2022   HCT 40.1 05/21/2022   PLT 214.0 05/21/2022   GLUCOSE 95 05/21/2022   CHOL 161 05/21/2022   TRIG 94.0 05/21/2022   HDL 41.90 05/21/2022   LDLCALC 100 (H) 05/21/2022   ALT 14 05/21/2022   AST 19 05/21/2022   NA 142 05/21/2022   K 3.3 (L) 05/21/2022   CL 104 05/21/2022   CREATININE 0.66 05/21/2022   BUN 8 05/21/2022   CO2 32 05/21/2022   TSH 1.18 07/10/2020   PSA WNL 10/30/2015   INR 1.1 (H) 12/31/2015   HGBA1C 6.1 05/21/2022   MICROALBUR 2.0 (H) 12/24/2021    VAS [place] ABI WITH/WO TBI  Result Date: 12/25/2021  LOWER EXTREMITY DOPPLER STUDY Patient Name:  Julie Rivas  Date of Exam:   12/25/2021 Medical Rec #: [id number]         Accession #:    [id number] Date of Birth: October 12, 1955          Patient Gender: F Patient Age:   81 years Exam Location:  [facility] Procedure:      VAS [place] ABI WITH/WO TBI Referring Phys: Scarlette Calico --------------------------------------------------------------------------------  Indications: Decreased pulses. High Risk Factors: Hypertension, hyperlipidemia, Diabetes, no history of                    smoking.  Performing Technologist: Alvia Grove RVT  Examination Guidelines: A complete evaluation  includes at minimum, Doppler waveform signals and systolic blood pressure reading at the level of bilateral brachial, anterior tibial, and posterior tibial arteries, when vessel segments are accessible. Bilateral testing is considered an integral part of a complete examination. Photoelectric Plethysmograph (PPG) waveforms and toe systolic pressure readings are included as required and additional duplex testing as needed. Limited examinations for  reoccurring indications may be performed as noted.  ABI Findings: +---------+------------------+-----+--------+--------+ Right    Rt Pressure (mmHg)IndexWaveformComment  +---------+------------------+-----+--------+--------+ Brachial 129                                     +---------+------------------+-----+--------+--------+ PTA      150               1.16 biphasic         +---------+------------------+-----+--------+--------+ DP       143               1.11 biphasic         +---------+------------------+-----+--------+--------+ Great Toe90                0.70 Normal           +---------+------------------+-----+--------+--------+ +---------+------------------+-----+--------+-------+ Left     Lt Pressure (mmHg)IndexWaveformComment +---------+------------------+-----+--------+-------+ Brachial 128                                    +---------+------------------+-----+--------+-------+ PTA      138               1.07 biphasic        +---------+------------------+-----+--------+-------+ DP       126               0.98 biphasic        +---------+------------------+-----+--------+-------+ Adair Patter               1.03 Normal          +---------+------------------+-----+--------+-------+ +-------+-----------+-----------+------------+------------+ ABI/TBIToday's ABIToday's TBIPrevious ABIPrevious TBI +-------+-----------+-----------+------------+------------+ Right  1.16       0.70                                 +-------+-----------+-----------+------------+------------+ Left   1.07       1.03                                +-------+-----------+-----------+------------+------------+   Summary: Right: Resting right ankle-brachial index is within normal range. No evidence of significant right lower extremity arterial disease. Left: Resting left ankle-brachial index is within normal range. No evidence of significant left lower extremity arterial disease. *See table(s) above for measurements and observations.  Electronically signed by Deitra Mayo MD on 12/25/2021 at 1:47:50 PM.    Final    DG Chest 2 View  Result Date: 07/13/2022 CLINICAL DATA:  Cough and fever. EXAM: CHEST - 2 VIEW COMPARISON:  07/16/2021 FINDINGS: The cardiomediastinal contours are normal. The lungs are clear. Pulmonary vasculature is normal. No consolidation, pleural effusion, or pneumothorax. Minor midthoracic degenerative change. No acute osseous abnormalities are seen. IMPRESSION: No acute chest findings. Electronically Signed   By: Keith Rake M.D.   On: 07/13/2022 16:51     Assessment & Plan:   Aliz was seen today for uri.  Diagnoses and all orders for this visit:  Acute cough- Chest x-ray is negative for infiltrate.  Influenza and COVID are negative.  Will treat for viral URI -     DG Chest 2 View; Future -     POC Influenza A&B(BINAX/QUICKVUE) -     POC COVID-19  Fever, unspecified fever cause -  DG Chest 2 View; Future -     POC Influenza A&B(BINAX/QUICKVUE) -     POC COVID-19  Viral URI with cough -     HYDROcodone bit-homatropine (HYCODAN) 5-1.5 MG/5ML syrup; Take 5 mLs by mouth every 8 (eight) hours as needed for up to 8 days for cough.   I am having Julie Rivas start on HYDROcodone bit-homatropine. I am also having her maintain her gabapentin, albuterol, budesonide-formoterol, carvedilol, rosuvastatin, clonazePAM, montelukast, cyclobenzaprine, blood glucose meter kit and supplies,  spironolactone, OneTouch Ultra, OneTouch Delica Plus [id number], and potassium chloride SA.  Meds ordered this encounter  Medications   HYDROcodone bit-homatropine (HYCODAN) 5-1.5 MG/5ML syrup    Sig: Take 5 mLs by mouth every 8 (eight) hours as needed for up to 8 days for cough.    Dispense:  120 mL    Refill:  0     Follow-up: Return if symptoms worsen or fail to improve.  Scarlette Calico, MD

## 2022-07-20 ENCOUNTER — Encounter: Payer: Self-pay | Admitting: Internal Medicine

## 2022-07-22 ENCOUNTER — Other Ambulatory Visit: Payer: Self-pay | Admitting: Internal Medicine

## 2022-07-22 DIAGNOSIS — J069 Acute upper respiratory infection, unspecified: Secondary | ICD-10-CM

## 2022-07-22 MED ORDER — BENZONATATE 200 MG PO CAPS: 200.0000 mg | ORAL_CAPSULE | Freq: Two times a day (BID) | ORAL | 2 refills | Status: DC | PRN

## 2022-07-27 ENCOUNTER — Encounter: Payer: Self-pay | Admitting: Internal Medicine

## 2022-08-04 ENCOUNTER — Telehealth: Payer: Self-pay | Admitting: Internal Medicine

## 2022-08-04 NOTE — Telephone Encounter (Signed)
Pt called to say her cough is no better and her face hurts. Pt reports 3 weeks symptoms. Pt saw [facility] on 07/13/22. Please advise or work in appointment with PCP. Call pt on home or on cell [phone]

## 2022-08-05 ENCOUNTER — Ambulatory Visit (INDEPENDENT_AMBULATORY_CARE_PROVIDER_SITE_OTHER): Payer: PPO | Admitting: Internal Medicine

## 2022-08-05 ENCOUNTER — Ambulatory Visit (INDEPENDENT_AMBULATORY_CARE_PROVIDER_SITE_OTHER): Payer: PPO

## 2022-08-05 ENCOUNTER — Encounter: Payer: Self-pay | Admitting: Internal Medicine

## 2022-08-05 VITALS — BP 120/84 | HR 106 | Temp 98.2°F | Ht 62.0 in | Wt 144.0 lb

## 2022-08-05 DIAGNOSIS — J370 Chronic laryngitis: Secondary | ICD-10-CM

## 2022-08-05 DIAGNOSIS — I10 Essential (primary) hypertension: Secondary | ICD-10-CM | POA: Diagnosis not present

## 2022-08-05 DIAGNOSIS — R058 Other specified cough: Secondary | ICD-10-CM

## 2022-08-05 DIAGNOSIS — J22 Unspecified acute lower respiratory infection: Secondary | ICD-10-CM

## 2022-08-05 DIAGNOSIS — R059 Cough, unspecified: Secondary | ICD-10-CM | POA: Diagnosis not present

## 2022-08-05 DIAGNOSIS — E876 Hypokalemia: Secondary | ICD-10-CM | POA: Diagnosis not present

## 2022-08-05 LAB — BASIC METABOLIC PANEL
BUN: 10 mg/dL (ref 6–23)
CO2: 31 mEq/L (ref 19–32)
Calcium: 9.3 mg/dL (ref 8.4–10.5)
Chloride: 104 mEq/L (ref 96–112)
Creatinine, Ser: 0.66 mg/dL (ref 0.40–1.20)
GFR: 90.09 mL/min (ref >60.00)
Glucose, Bld: 83 mg/dL (ref 70–99)
Potassium: 3.3 mEq/L — ABNORMAL LOW (ref 3.5–5.1)
Sodium: 142 mEq/L (ref 135–145)

## 2022-08-05 LAB — MAGNESIUM: Magnesium: 2 mg/dL (ref 1.5–2.5)

## 2022-08-05 MED ORDER — AMOXICILLIN-POT CLAVULANATE 875-125 MG PO TABS: 1.0000 | ORAL_TABLET | Freq: Two times a day (BID) | ORAL | 0 refills | Status: AC

## 2022-08-05 MED ORDER — PROMETHAZINE-DM 6.25-15 MG/5ML PO SYRP: 5.0000 mL | ORAL_SOLUTION | Freq: Four times a day (QID) | ORAL | 0 refills | Status: AC | PRN

## 2022-08-05 NOTE — Progress Notes (Signed)
Subjective:  Patient ID: Julie Rivas, female    DOB: 23-Jul-1956  Age: 66 y.o. MRN: [id number]  CC: Cough   HPI Julie Rivas presents for f/up -   She complains of a 3-week history of cough that is productive of yellow phlegm with shortness of breath and facial pain.  She has chronic laryngitis.  She is not taking the potassium supplement.  Outpatient Medications Prior to Visit  Medication Sig Dispense Refill   albuterol (PROVENTIL) (2.5 MG/3ML) 0.083% nebulizer solution Take 3 mLs (2.5 mg total) by nebulization every 6 (six) hours as needed for wheezing or shortness of breath. 150 mL 2   benzonatate (TESSALON) 200 MG capsule Take 1 capsule (200 mg total) by mouth 2 (two) times daily as needed for cough. 20 capsule 2   blood glucose meter kit and supplies Dispense based on patient and insurance preference. Use up to four times daily as directed. (FOR ICD-10 E10.9, E11.9). 1 each 0   budesonide-formoterol (SYMBICORT) 80-4.5 MCG/ACT inhaler Inhale 2 puffs into the lungs 2 (two) times daily. 30.6 g 1   carvedilol (COREG) 3.125 MG tablet Take 1 tablet (3.125 mg total) by mouth 2 (two) times daily with a meal. 180 tablet 0   clonazePAM (KLONOPIN) 0.5 MG tablet TAKE 1 TABLET BY MOUTH THREE TIMES DAILY AS NEEDED FOR ANXIETY 90 tablet 0   cyclobenzaprine (FLEXERIL) 10 MG tablet TAKE 1 TABLET BY MOUTH UP TO THREE TIMES DAILY 90 tablet 0   gabapentin (NEURONTIN) 300 MG capsule Take 1 capsule by mouth at bedtime 90 capsule 0   Lancets (ONETOUCH DELICA PLUS [id number]) MISC Place 1 Act onto the skin 4 (four) times daily as needed. 100 each 5   montelukast (SINGULAIR) 10 MG tablet Take 1 tablet (10 mg total) by mouth at bedtime. 90 tablet 1   ONETOUCH ULTRA test strip USE AS DIRECTED UP TO  4 TIMES DAILY TO CHECK BLOOD SUGARS 100 each 5   potassium chloride SA (KLOR-CON M) 20 MEQ tablet Take 1 tablet (20 mEq total) by mouth 2 (two) times daily. 180 tablet 0   rosuvastatin (CRESTOR) 20 MG tablet  Take 1 tablet (20 mg total) by mouth daily. 90 tablet 1   spironolactone (ALDACTONE) 50 MG tablet Take 1 tablet by mouth once daily 90 tablet 0   No facility-administered medications prior to visit.    ROS Review of Systems  Constitutional:  Negative for chills, diaphoresis, fatigue and fever.  HENT:  Positive for voice change. Negative for sore throat and trouble swallowing.   Eyes: Negative.   Respiratory:  Positive for cough and shortness of breath. Negative for choking, chest tightness, wheezing and stridor.   Cardiovascular:  Negative for chest pain, palpitations and leg swelling.  Gastrointestinal:  Negative for abdominal pain, diarrhea, nausea and vomiting.  Endocrine: Negative.   Genitourinary: Negative.  Negative for difficulty urinating.  Musculoskeletal: Negative.  Negative for arthralgias and myalgias.  Skin: Negative.  Negative for rash.  Neurological: Negative.  Negative for dizziness, weakness and headaches.  Hematological:  Negative for adenopathy. Does not bruise/bleed easily.  Psychiatric/Behavioral: Negative.      Objective:  BP 120/84 (BP Location: Left Arm, Patient Position: Sitting, Cuff Size: Large)   Pulse (!) 106   Temp 98.2 F (36.8 C) (Oral)   Ht _0  (1.575 m)   Wt 144 lb (65.3 kg)   SpO2 98%   BMI 26.34 kg/m   BP Readings from Last 3 Encounters:  08/05/22  120/84  07/13/22 124/78  05/21/22 130/80    Wt Readings from Last 3 Encounters:  08/05/22 144 lb (65.3 kg)  07/13/22 141 lb (64 kg)  05/21/22 143 lb (64.9 kg)    Physical Exam Vitals reviewed.  HENT:     Mouth/Throat:     Mouth: Mucous membranes are moist.  Eyes:     General: No scleral icterus.    Conjunctiva/sclera: Conjunctivae normal.  Cardiovascular:     Rate and Rhythm: Normal rate and regular rhythm.     Heart sounds: No murmur heard. Pulmonary:     Effort: Pulmonary effort is normal.     Breath sounds: No stridor. No wheezing, rhonchi or rales.  Abdominal:      General: Abdomen is flat.     Palpations: There is no mass.     Tenderness: There is no abdominal tenderness. There is no guarding.     Hernia: No hernia is present.  Musculoskeletal:        General: Normal range of motion.     Cervical back: Neck supple.     Right lower leg: No edema.     Left lower leg: No edema.  Lymphadenopathy:     Cervical: No cervical adenopathy.  Skin:    General: Skin is dry.  Neurological:     General: No focal deficit present.     Mental Status: She is alert.  Psychiatric:        Mood and Affect: Mood normal.        Behavior: Behavior normal.     Lab Results  Component Value Date   WBC 5.8 05/21/2022   HGB 13.6 05/21/2022   HCT 40.1 05/21/2022   PLT 214.0 05/21/2022   GLUCOSE 83 08/05/2022   CHOL 161 05/21/2022   TRIG 94.0 05/21/2022   HDL 41.90 05/21/2022   LDLCALC 100 (H) 05/21/2022   ALT 14 05/21/2022   AST 19 05/21/2022   NA 142 08/05/2022   K 3.3 (L) 08/05/2022   CL 104 08/05/2022   CREATININE 0.66 08/05/2022   BUN 10 08/05/2022   CO2 31 08/05/2022   TSH 1.18 07/10/2020   PSA WNL 10/30/2015   INR 1.1 (H) 12/31/2015   HGBA1C 6.1 05/21/2022   MICROALBUR 2.0 (H) 12/24/2021    VAS [place] ABI WITH/WO TBI  Result Date: 12/25/2021  LOWER EXTREMITY DOPPLER STUDY Patient Name:  Julie Rivas  Date of Exam:   12/25/2021 Medical Rec #: [id number]         Accession #:    [id number] Date of Birth: [birth year]          Patient Gender: F Patient Age:   53 years Exam Location:  [facility] Procedure:      VAS [place] ABI WITH/WO TBI Referring Phys: Scarlette Calico --------------------------------------------------------------------------------  Indications: Decreased pulses. High Risk Factors: Hypertension, hyperlipidemia, Diabetes, no history of                    smoking.  Performing Technologist: Alvia Grove RVT  Examination Guidelines: A complete evaluation includes at minimum, Doppler waveform signals and systolic blood pressure reading at  the level of bilateral brachial, anterior tibial, and posterior tibial arteries, when vessel segments are accessible. Bilateral testing is considered an integral part of a complete examination. Photoelectric Plethysmograph (PPG) waveforms and toe systolic pressure readings are included as required and additional duplex testing as needed. Limited examinations for reoccurring indications may be performed as noted.  ABI Findings: +---------+------------------+-----+--------+--------+  Right    Rt Pressure (mmHg)IndexWaveformComment  +---------+------------------+-----+--------+--------+ Brachial 129                                     +---------+------------------+-----+--------+--------+ PTA      150               1.16 biphasic         +---------+------------------+-----+--------+--------+ DP       143               1.11 biphasic         +---------+------------------+-----+--------+--------+ Great Toe90                0.70 Normal           +---------+------------------+-----+--------+--------+ +---------+------------------+-----+--------+-------+ Left     Lt Pressure (mmHg)IndexWaveformComment +---------+------------------+-----+--------+-------+ Brachial 128                                    +---------+------------------+-----+--------+-------+ PTA      138               1.07 biphasic        +---------+------------------+-----+--------+-------+ DP       126               0.98 biphasic        +---------+------------------+-----+--------+-------+ Julie Rivas               1.03 Normal          +---------+------------------+-----+--------+-------+ +-------+-----------+-----------+------------+------------+ ABI/TBIToday's ABIToday's TBIPrevious ABIPrevious TBI +-------+-----------+-----------+------------+------------+ Right  1.16       0.70                                +-------+-----------+-----------+------------+------------+ Left   1.07        1.03                                +-------+-----------+-----------+------------+------------+   Summary: Right: Resting right ankle-brachial index is within normal range. No evidence of significant right lower extremity arterial disease. Left: Resting left ankle-brachial index is within normal range. No evidence of significant left lower extremity arterial disease. *See table(s) above for measurements and observations.  Electronically signed by Deitra Mayo MD on 12/25/2021 at 1:47:50 PM.    Final    DG MOBILE BONE DENSITY  Result Date: 08/07/2022 EXAM: DUAL X-RAY ABSORPTIOMETRY (DXA) FOR BONE MINERAL DENSITY 08/06/2022 1:28 pm CLINICAL DATA:  66 year old Female Postmenopausal. estrogen deficiency TECHNIQUE: An axial (e.g., hips, spine) and/or appendicular (e.g., radius) exam was performed, as appropriate, using Clinical research associate at [employer]. Images are obtained for bone mineral density measurement and are not obtained for diagnostic purposes. [id number] Exclusions: None. COMPARISON:  None. FINDINGS: Scan quality: Good. LUMBAR SPINE (L1-L4): BMD (in g/cm2): 1.363 T-score: 1.9 Z-score: 4.1 LEFT FEMORAL NECK: BMD (in g/cm2): 0.799 T-score: -1.1 Z-score: 0.3 LEFT TOTAL HIP: BMD (in g/cm2): 1.059 T-score: 0.2 Z-score: 1.3 LEFT FOREARM (RADIUS 33%): BMD (in g/cm2): 0.684 T-score: -0.2 Z-score: 1.8 FRAX 10-YEAR PROBABILITY OF FRACTURE: 10-year fracture risk is performed using the [facility] FRAX calculator based on patient-reported risk factors. Major osteoporotic fracture: 3.4 % Hip fracture: 0.2 % IMPRESSION: Osteopenia based on BMD. Fracture  risk is increased. Increased risk is based on low BMD. RECOMMENDATIONS: 1. All patients should optimize calcium and vitamin D intake. 2. Consider FDA-approved medical therapies in postmenopausal women and men aged 68 years and older, based on the following: - A hip or vertebral (clinical or morphometric) fracture - T-score less  than or equal to -2.5 and secondary causes have been excluded. - Low bone mass (T-score between -1.0 and -2.5) and a 10-year probability of a hip fracture greater than or equal to 3% or a 10-year probability of a major osteoporosis-related fracture greater than or equal to 20% based on the [place]-adapted WHO algorithm. - Clinician judgment and/or patient preferences may indicate treatment for people with 10-year fracture probabilities above or below these levels 3. Patients with diagnosis of osteoporosis or at high risk for fracture should have regular bone mineral density tests. For patients eligible for Medicare, routine testing is allowed once every 2 years. The testing frequency can be increased to one year for patients who have rapidly progressing disease, those who are receiving or discontinuing medical therapy to restore bone mass, or have additional risk factors. Electronically Signed   By: Zerita Boers M.D.   On: 08/07/2022 09:05     DG MOBILE BONE DENSITY  Result Date: 08/07/2022 EXAM: DUAL X-RAY ABSORPTIOMETRY (DXA) FOR BONE MINERAL DENSITY 08/06/2022 1:28 pm CLINICAL DATA:  66 year old Female Postmenopausal. estrogen deficiency TECHNIQUE: An axial (e.g., hips, spine) and/or appendicular (e.g., radius) exam was performed, as appropriate, using Clinical research associate at [employer]. Images are obtained for bone mineral density measurement and are not obtained for diagnostic purposes. [id number] Exclusions: None. COMPARISON:  None. FINDINGS: Scan quality: Good. LUMBAR SPINE (L1-L4): BMD (in g/cm2): 1.363 T-score: 1.9 Z-score: 4.1 LEFT FEMORAL NECK: BMD (in g/cm2): 0.799 T-score: -1.1 Z-score: 0.3 LEFT TOTAL HIP: BMD (in g/cm2): 1.059 T-score: 0.2 Z-score: 1.3 LEFT FOREARM (RADIUS 33%): BMD (in g/cm2): 0.684 T-score: -0.2 Z-score: 1.8 FRAX 10-YEAR PROBABILITY OF FRACTURE: 10-year fracture risk is performed using the [facility] FRAX calculator based on patient-reported risk  factors. Major osteoporotic fracture: 3.4 % Hip fracture: 0.2 % IMPRESSION: Osteopenia based on BMD. Fracture risk is increased. Increased risk is based on low BMD. RECOMMENDATIONS: 1. All patients should optimize calcium and vitamin D intake. 2. Consider FDA-approved medical therapies in postmenopausal women and men aged 71 years and older, based on the following: - A hip or vertebral (clinical or morphometric) fracture - T-score less than or equal to -2.5 and secondary causes have been excluded. - Low bone mass (T-score between -1.0 and -2.5) and a 10-year probability of a hip fracture greater than or equal to 3% or a 10-year probability of a major osteoporosis-related fracture greater than or equal to 20% based on the [place]-adapted WHO algorithm. - Clinician judgment and/or patient preferences may indicate treatment for people with 10-year fracture probabilities above or below these levels 3. Patients with diagnosis of osteoporosis or at high risk for fracture should have regular bone mineral density tests. For patients eligible for Medicare, routine testing is allowed once every 2 years. The testing frequency can be increased to one year for patients who have rapidly progressing disease, those who are receiving or discontinuing medical therapy to restore bone mass, or have additional risk factors. Electronically Signed   By: Zerita Boers M.D.   On: 08/07/2022 09:05   DG Chest 2 View  Result Date: 08/05/2022 CLINICAL DATA:  Cough for 3 weeks. EXAM: CHEST - 2 VIEW  COMPARISON:  Two-view chest x-ray 07/13/2022 FINDINGS: Heart size is normal. Lung volumes are low. No edema or effusion is present. No focal airspace disease is present. Visualized soft tissues and bony thorax are unremarkable. IMPRESSION: 1. Low lung volumes. 2. No acute cardiopulmonary disease. Electronically Signed   By: San Morelle M.D.   On: 08/05/2022 12:27     Assessment & Plan:   Julie Rivas was seen today for cough.  Diagnoses and  all orders for this visit:  Cough productive of purulent sputum- Chest x-ray is negative for mass or infiltrate.  Will treat for lower respiratory tract infection. -     DG Chest 2 View; Future -     promethazine-dextromethorphan (PROMETHAZINE-DM) 6.25-15 MG/5ML syrup; Take 5 mLs by mouth 4 (four) times daily as needed for up to 8 days for cough.  Laryngitis, chronic -     Ambulatory referral to ENT  Essential hypertension- Her blood pressure is adequately well-controlled. -     Basic metabolic panel; Future -     Magnesium; Future -     Magnesium -     Basic metabolic panel  Chronic hypokalemia- I have asked her to be more compliant with the potassium supplement. -     Basic metabolic panel; Future -     Magnesium; Future -     Magnesium -     Basic metabolic panel  LRTI (lower respiratory tract infection) -     amoxicillin-clavulanate (AUGMENTIN) 875-125 MG tablet; Take 1 tablet by mouth 2 (two) times daily for 10 days. -     promethazine-dextromethorphan (PROMETHAZINE-DM) 6.25-15 MG/5ML syrup; Take 5 mLs by mouth 4 (four) times daily as needed for up to 8 days for cough.   I am having Julie Rivas start on amoxicillin-clavulanate and promethazine-dextromethorphan. I am also having her maintain her gabapentin, albuterol, budesonide-formoterol, carvedilol, rosuvastatin, clonazePAM, montelukast, cyclobenzaprine, blood glucose meter kit and supplies, spironolactone, OneTouch Ultra, OneTouch Delica Plus [id number], potassium chloride SA, and benzonatate.  Meds ordered this encounter  Medications   amoxicillin-clavulanate (AUGMENTIN) 875-125 MG tablet    Sig: Take 1 tablet by mouth 2 (two) times daily for 10 days.    Dispense:  20 tablet    Refill:  0   promethazine-dextromethorphan (PROMETHAZINE-DM) 6.25-15 MG/5ML syrup    Sig: Take 5 mLs by mouth 4 (four) times daily as needed for up to 8 days for cough.    Dispense:  118 mL    Refill:  0     Follow-up: Return in about 3  months (around 11/05/2022).  Scarlette Calico, MD

## 2022-08-05 NOTE — Telephone Encounter (Signed)
Pt has been scheduled for today 11/29 @ 11.40am.

## 2022-08-05 NOTE — Patient Instructions (Signed)

## 2022-08-06 ENCOUNTER — Ambulatory Visit
Admission: RE | Admit: 2022-08-06 | Discharge: 2022-08-06 | Disposition: A | Discharge status: Home / Self Care | Payer: PPO | Source: Ambulatory Visit | Attending: Internal Medicine | Admitting: Internal Medicine

## 2022-08-06 DIAGNOSIS — Z780 Asymptomatic menopausal state: Secondary | ICD-10-CM | POA: Diagnosis not present

## 2022-08-06 DIAGNOSIS — M85852 Other specified disorders of bone density and structure, left thigh: Secondary | ICD-10-CM | POA: Diagnosis not present

## 2022-08-06 DIAGNOSIS — E2839 Other primary ovarian failure: Secondary | ICD-10-CM

## 2022-08-14 ENCOUNTER — Other Ambulatory Visit: Payer: Self-pay | Admitting: Internal Medicine

## 2022-08-14 DIAGNOSIS — I10 Essential (primary) hypertension: Secondary | ICD-10-CM

## 2022-08-14 DIAGNOSIS — E876 Hypokalemia: Secondary | ICD-10-CM

## 2022-08-20 ENCOUNTER — Ambulatory Visit: Payer: HMO | Admitting: Internal Medicine

## 2022-09-03 ENCOUNTER — Ambulatory Visit
Admission: RE | Admit: 2022-09-03 | Discharge: 2022-09-03 | Disposition: A | Discharge status: Home / Self Care | Payer: PPO | Source: Ambulatory Visit | Attending: Internal Medicine | Admitting: Internal Medicine

## 2022-09-03 DIAGNOSIS — Z1231 Encounter for screening mammogram for malignant neoplasm of breast: Secondary | ICD-10-CM | POA: Diagnosis not present

## 2022-09-21 ENCOUNTER — Encounter: Payer: Self-pay | Admitting: Internal Medicine

## 2022-09-21 ENCOUNTER — Ambulatory Visit (INDEPENDENT_AMBULATORY_CARE_PROVIDER_SITE_OTHER): Payer: PPO | Admitting: Internal Medicine

## 2022-09-21 VITALS — BP 126/84 | HR 95 | Temp 98.2°F | Ht 62.0 in | Wt 144.0 lb

## 2022-09-21 DIAGNOSIS — M5431 Sciatica, right side: Secondary | ICD-10-CM

## 2022-09-21 DIAGNOSIS — J4541 Moderate persistent asthma with (acute) exacerbation: Secondary | ICD-10-CM

## 2022-09-21 DIAGNOSIS — I10 Essential (primary) hypertension: Secondary | ICD-10-CM | POA: Diagnosis not present

## 2022-09-21 DIAGNOSIS — N1832 Chronic kidney disease, stage 3b: Secondary | ICD-10-CM

## 2022-09-21 DIAGNOSIS — E876 Hypokalemia: Secondary | ICD-10-CM

## 2022-09-21 DIAGNOSIS — F411 Generalized anxiety disorder: Secondary | ICD-10-CM | POA: Diagnosis not present

## 2022-09-21 DIAGNOSIS — J4540 Moderate persistent asthma, uncomplicated: Secondary | ICD-10-CM

## 2022-09-21 LAB — BASIC METABOLIC PANEL
BUN: 15 mg/dL (ref 6–23)
CO2: 30 mEq/L (ref 19–32)
Calcium: 9.3 mg/dL (ref 8.4–10.5)
Chloride: 102 mEq/L (ref 96–112)
Creatinine, Ser: 0.69 mg/dL (ref 0.40–1.20)
GFR: 89.05 mL/min (ref >60.00)
Glucose, Bld: 116 mg/dL — ABNORMAL HIGH (ref 70–99)
Potassium: 3.4 mEq/L — ABNORMAL LOW (ref 3.5–5.1)
Sodium: 141 mEq/L (ref 135–145)

## 2022-09-21 LAB — MAGNESIUM: Magnesium: 2 mg/dL (ref 1.5–2.5)

## 2022-09-21 MED ORDER — MONTELUKAST SODIUM 10 MG PO TABS: 10.0000 mg | ORAL_TABLET | Freq: Every day | ORAL | 1 refills | Status: DC

## 2022-09-21 MED ORDER — CLONAZEPAM 0.5 MG PO TABS: 0.5000 mg | ORAL_TABLET | Freq: Three times a day (TID) | ORAL | 2 refills | Status: DC | PRN

## 2022-09-21 MED ORDER — CYCLOBENZAPRINE HCL 5 MG PO TABS: 5.0000 mg | ORAL_TABLET | Freq: Three times a day (TID) | ORAL | 2 refills | Status: DC | PRN

## 2022-09-21 NOTE — Patient Instructions (Signed)
Hypokalemia Hypokalemia means that the amount of potassium in the blood is lower than normal. Potassium is a mineral (electrolyte) that helps regulate the amount of fluid in the body. It also stimulates muscle tightening (contraction) and helps nerves work properly. Normally, most of the body's potassium is inside cells, and only a very small amount is in the blood. Because the amount in the blood is so small, minor changes to potassium levels in the blood can be life-threatening. What are the causes? This condition may be caused by: Antibiotic medicine. Diarrhea or vomiting. Taking too much of a medicine that helps you have a bowel movement (laxative) can cause diarrhea and lead to hypokalemia. Chronic kidney disease (CKD). Medicines that help the body get rid of excess fluid (diuretics). Eating disorders, such as anorexia or bulimia. Low magnesium levels in the body. Sweating a lot. What are the signs or symptoms? Symptoms of this condition include: Weakness. Constipation. Fatigue. Muscle cramps. Mental confusion. Skipped heartbeats or irregular heartbeat (palpitations). Tingling or numbness. How is this diagnosed? This condition is diagnosed with a blood test. How is this treated? This condition may be treated by: Taking potassium supplements. Adjusting the medicines that you take. Eating more foods that contain a lot of potassium. If your potassium level is very low, you may need to get potassium through an IV and be monitored in the hospital. Follow these instructions at home: Eating and drinking  Eat a healthy diet. A healthy diet includes fresh fruits and vegetables, whole grains, healthy fats, and lean proteins. If told, eat more foods that contain a lot of potassium. These include: Nuts, such as peanuts and pistachios. Seeds, such as sunflower seeds and pumpkin seeds. Peas, lentils, and lima beans. Whole grain and bran cereals and breads. Fresh fruits and vegetables,  such as apricots, avocado, bananas, cantaloupe, kiwi, oranges, tomatoes, asparagus, and potatoes. Juices, such as orange, tomato, and prune. Lean meats, including fish. Milk and milk products, such as yogurt. General instructions Take over-the-counter and prescription medicines only as told by your health care provider. This includes vitamins, natural food products, and supplements. Keep all follow-up visits. This is important. Contact a health care provider if: You have weakness that gets worse. You feel your heart pounding or racing. You vomit. You have diarrhea. You have diabetes and you have trouble keeping your blood sugar in your target range. Get help right away if: You have chest pain. You have shortness of breath. You have vomiting or diarrhea that lasts for more than 2 days. You faint. These symptoms may be an emergency. Get help right away. Call 911. Do not wait to see if the symptoms will go away. Do not drive yourself to the hospital. Summary Hypokalemia means that the amount of potassium in the blood is lower than normal. This condition is diagnosed with a blood test. Hypokalemia may be treated by taking potassium supplements, adjusting the medicines that you take, or eating more foods that are high in potassium. If your potassium level is very low, you may need to get potassium through an IV and be monitored in the hospital. This information is not intended to replace advice given to you by your health care provider. Make sure you discuss any questions you have with your health care provider. Document Revised: 05/08/2021 Document Reviewed: 05/08/2021 Elsevier Patient Education  [place].

## 2022-09-21 NOTE — Progress Notes (Signed)
Subjective:  Patient ID: Julie Rivas, female    DOB: 1955/10/13  Age: 67 y.o. MRN: [id number]  CC: Hypertension   HPI Julie Rivas presents for f/up -  She is not taking the 20 milliequivalent tablet of KCl because it is too large for her to swallow.  She has had rare wheezing but denies cough, chest pain, shortness of breath, or diaphoresis.  Outpatient Medications Prior to Visit  Medication Sig Dispense Refill   albuterol (PROVENTIL) (2.5 MG/3ML) 0.083% nebulizer solution Take 3 mLs (2.5 mg total) by nebulization every 6 (six) hours as needed for wheezing or shortness of breath. 150 mL 2   benzonatate (TESSALON) 200 MG capsule Take 1 capsule (200 mg total) by mouth 2 (two) times daily as needed for cough. 20 capsule 2   blood glucose meter kit and supplies Dispense based on patient and insurance preference. Use up to four times daily as directed. (FOR ICD-10 E10.9, E11.9). 1 each 0   budesonide-formoterol (SYMBICORT) 80-4.5 MCG/ACT inhaler Inhale 2 puffs into the lungs 2 (two) times daily. 30.6 g 1   carvedilol (COREG) 3.125 MG tablet Take 1 tablet (3.125 mg total) by mouth 2 (two) times daily with a meal. 180 tablet 0   gabapentin (NEURONTIN) 300 MG capsule Take 1 capsule by mouth at bedtime 90 capsule 0   Lancets (ONETOUCH DELICA PLUS [id number]) MISC Place 1 Act onto the skin 4 (four) times daily as needed. 100 each 5   ONETOUCH ULTRA test strip USE AS DIRECTED UP TO  4 TIMES DAILY TO CHECK BLOOD SUGARS 100 each 5   rosuvastatin (CRESTOR) 20 MG tablet Take 1 tablet (20 mg total) by mouth daily. 90 tablet 1   spironolactone (ALDACTONE) 50 MG tablet Take 1 tablet by mouth once daily 90 tablet 0   clonazePAM (KLONOPIN) 0.5 MG tablet TAKE 1 TABLET BY MOUTH THREE TIMES DAILY AS NEEDED FOR ANXIETY 90 tablet 0   cyclobenzaprine (FLEXERIL) 10 MG tablet TAKE 1 TABLET BY MOUTH UP TO THREE TIMES DAILY 90 tablet 0   montelukast (SINGULAIR) 10 MG tablet Take 1 tablet (10 mg total) by mouth  at bedtime. 90 tablet 1   potassium chloride SA (KLOR-CON M) 20 MEQ tablet Take 1 tablet (20 mEq total) by mouth 2 (two) times daily. 180 tablet 0   No facility-administered medications prior to visit.    ROS Review of Systems  Constitutional: Negative.  Negative for diaphoresis, fatigue and unexpected weight change.  HENT: Negative.    Eyes: Negative.   Respiratory:  Positive for wheezing. Negative for cough, chest tightness and shortness of breath.   Cardiovascular:  Negative for chest pain, palpitations and leg swelling.  Gastrointestinal:  Negative for abdominal pain, constipation, diarrhea, nausea and vomiting.  Endocrine: Negative.   Genitourinary: Negative.  Negative for difficulty urinating.  Musculoskeletal:  Positive for back pain. Negative for arthralgias and myalgias.  Skin: Negative.   Neurological:  Negative for dizziness, weakness and headaches.  Hematological:  Negative for adenopathy. Does not bruise/bleed easily.  Psychiatric/Behavioral:  Negative for decreased concentration, dysphoric mood, sleep disturbance and suicidal ideas. The patient is nervous/anxious.     Objective:  BP 126/84 (BP Location: Left Arm, Patient Position: Sitting, Cuff Size: Large)   Pulse 95   Temp 98.2 F (36.8 C) (Oral)   Ht '5\' 2"'$  (1.575 m)   Wt 144 lb (65.3 kg)   SpO2 97%   BMI 26.34 kg/m   BP Readings from Last 3 Encounters:  09/21/22 126/84  08/05/22 120/84  07/13/22 124/78    Wt Readings from Last 3 Encounters:  09/21/22 144 lb (65.3 kg)  08/05/22 144 lb (65.3 kg)  07/13/22 141 lb (64 kg)    Physical Exam Vitals reviewed.  HENT:     Nose: Nose normal.     Mouth/Throat:     Mouth: Mucous membranes are moist.  Eyes:     General: No scleral icterus.    Conjunctiva/sclera: Conjunctivae normal.  Cardiovascular:     Rate and Rhythm: Normal rate and regular rhythm.     Heart sounds: No murmur heard. Pulmonary:     Effort: Pulmonary effort is normal.     Breath sounds:  No stridor. No wheezing, rhonchi or rales.  Abdominal:     General: Abdomen is flat.     Palpations: There is no mass.     Tenderness: There is no abdominal tenderness. There is no guarding.     Hernia: No hernia is present.  Musculoskeletal:        General: Normal range of motion.     Cervical back: Neck supple.     Right lower leg: No edema.     Left lower leg: No edema.  Lymphadenopathy:     Cervical: No cervical adenopathy.  Skin:    General: Skin is warm and dry.  Neurological:     General: No focal deficit present.  Psychiatric:        Mood and Affect: Mood normal.        Behavior: Behavior normal.     Lab Results  Component Value Date   WBC 5.8 05/21/2022   HGB 13.6 05/21/2022   HCT 40.1 05/21/2022   PLT 214.0 05/21/2022   GLUCOSE 116 (H) 09/21/2022   CHOL 161 05/21/2022   TRIG 94.0 05/21/2022   HDL 41.90 05/21/2022   LDLCALC 100 (H) 05/21/2022   ALT 14 05/21/2022   AST 19 05/21/2022   NA 141 09/21/2022   K 3.4 (L) 09/21/2022   CL 102 09/21/2022   CREATININE 0.69 09/21/2022   BUN 15 09/21/2022   CO2 30 09/21/2022   TSH 1.18 07/10/2020   PSA WNL 10/30/2015   INR 1.1 (H) 12/31/2015   HGBA1C 6.1 05/21/2022   MICROALBUR 2.0 (H) 12/24/2021    MM 3D SCREEN BREAST BILATERAL  Result Date: 09/03/2022 CLINICAL DATA:  Screening. EXAM: DIGITAL SCREENING BILATERAL MAMMOGRAM WITH TOMOSYNTHESIS AND CAD TECHNIQUE: Bilateral screening digital craniocaudal and mediolateral oblique mammograms were obtained. Bilateral screening digital breast tomosynthesis was performed. The images were evaluated with computer-aided detection. COMPARISON:  Previous exam(s). ACR Breast Density Category b: There are scattered areas of fibroglandular density. FINDINGS: There are no findings suspicious for malignancy. IMPRESSION: No mammographic evidence of malignancy. A result letter of this screening mammogram will be mailed directly to the patient. RECOMMENDATION: Screening mammogram in one  year. (Code:SM-B-01Y) BI-RADS CATEGORY  1: Negative. Electronically Signed   By: Franki Cabot M.D.   On: 09/03/2022 14:59    Assessment & Plan:   Julie Rivas was seen today for hypertension.  Diagnoses and all orders for this visit:  Essential hypertension- Her BP is well controlled. -     Basic metabolic panel; Future -     Basic metabolic panel  GAD (generalized anxiety disorder) -     clonazePAM (KLONOPIN) 0.5 MG tablet; Take 1 tablet (0.5 mg total) by mouth 3 (three) times daily as needed. for anxiety  Stage 3b chronic kidney disease ([place]) -  Basic metabolic panel; Future -     Basic metabolic panel  Chronic hypokalemia -     Basic metabolic panel; Future -     Magnesium; Future -     Magnesium -     Basic metabolic panel -     potassium chloride (KLOR-CON 10) 10 MEQ tablet; Take 1 tablet (10 mEq total) by mouth 3 (three) times daily.  Moderate persistent asthma without complication -     montelukast (SINGULAIR) 10 MG tablet; Take 1 tablet (10 mg total) by mouth at bedtime.  Moderate persistent asthma with acute exacerbation  Sciatica of right side -     cyclobenzaprine (FLEXERIL) 5 MG tablet; Take 1 tablet (5 mg total) by mouth 3 (three) times daily as needed for muscle spasms.   I have discontinued Kyleeann G. Kroeze's cyclobenzaprine and potassium chloride SA. I have also changed her clonazePAM. Additionally, I am having her start on cyclobenzaprine and potassium chloride. Lastly, I am having her maintain her gabapentin, albuterol, budesonide-formoterol, carvedilol, rosuvastatin, blood glucose meter kit and supplies, OneTouch Ultra, OneTouch Delica Plus [id number], benzonatate, spironolactone, and montelukast.  Meds ordered this encounter  Medications   clonazePAM (KLONOPIN) 0.5 MG tablet    Sig: Take 1 tablet (0.5 mg total) by mouth 3 (three) times daily as needed. for anxiety    Dispense:  90 tablet    Refill:  2   montelukast (SINGULAIR) 10 MG tablet    Sig: Take  1 tablet (10 mg total) by mouth at bedtime.    Dispense:  90 tablet    Refill:  1   cyclobenzaprine (FLEXERIL) 5 MG tablet    Sig: Take 1 tablet (5 mg total) by mouth 3 (three) times daily as needed for muscle spasms.    Dispense:  90 tablet    Refill:  2   potassium chloride (KLOR-CON 10) 10 MEQ tablet    Sig: Take 1 tablet (10 mEq total) by mouth 3 (three) times daily.    Dispense:  270 tablet    Refill:  0     Follow-up: Return in about 4 months (around 01/20/2023).  Scarlette Calico, MD

## 2022-09-26 MED ORDER — POTASSIUM CHLORIDE ER 10 MEQ PO TBCR: 10.0000 meq | EXTENDED_RELEASE_TABLET | Freq: Three times a day (TID) | ORAL | 0 refills | Status: DC

## 2022-10-21 ENCOUNTER — Encounter: Payer: Self-pay | Admitting: Internal Medicine

## 2022-10-21 ENCOUNTER — Ambulatory Visit (INDEPENDENT_AMBULATORY_CARE_PROVIDER_SITE_OTHER): Payer: PPO | Admitting: Internal Medicine

## 2022-10-21 VITALS — BP 130/78 | HR 93 | Temp 98.6°F | Resp 16 | Ht 62.0 in | Wt 148.0 lb

## 2022-10-21 DIAGNOSIS — G63 Polyneuropathy in diseases classified elsewhere: Secondary | ICD-10-CM | POA: Diagnosis not present

## 2022-10-21 DIAGNOSIS — E538 Deficiency of other specified B group vitamins: Secondary | ICD-10-CM

## 2022-10-21 DIAGNOSIS — E1140 Type 2 diabetes mellitus with diabetic neuropathy, unspecified: Secondary | ICD-10-CM | POA: Diagnosis not present

## 2022-10-21 DIAGNOSIS — E118 Type 2 diabetes mellitus with unspecified complications: Secondary | ICD-10-CM | POA: Diagnosis not present

## 2022-10-21 DIAGNOSIS — I10 Essential (primary) hypertension: Secondary | ICD-10-CM

## 2022-10-21 LAB — CBC WITH DIFFERENTIAL/PLATELET
Basophils Absolute: 0 10*3/uL (ref 0.0–0.1)
Basophils Relative: 0.5 % (ref 0.0–3.0)
Eosinophils Absolute: 0.1 10*3/uL (ref 0.0–0.7)
Eosinophils Relative: 0.9 % (ref 0.0–5.0)
HCT: 42.3 % (ref 36.0–46.0)
Hemoglobin: 14.2 g/dL (ref 12.0–15.0)
Lymphocytes Relative: 45.2 % (ref 12.0–46.0)
Lymphs Abs: 2.8 10*3/uL (ref 0.7–4.0)
MCHC: 33.6 g/dL (ref 30.0–36.0)
MCV: 95.7 fl (ref 78.0–100.0)
Monocytes Absolute: 0.4 10*3/uL (ref 0.1–1.0)
Monocytes Relative: 7.2 % (ref 3.0–12.0)
Neutro Abs: 2.9 10*3/uL (ref 1.4–7.7)
Neutrophils Relative %: 46.2 % (ref 43.0–77.0)
Platelets: 224 10*3/uL (ref 150.0–400.0)
RBC: 4.42 Mil/uL (ref 3.87–5.11)
RDW: 12.8 % (ref 11.5–15.5)
WBC: 6.2 10*3/uL (ref 4.0–10.5)

## 2022-10-21 LAB — VITAMIN B12: Vitamin B-12: 232 pg/mL (ref 211–911)

## 2022-10-21 LAB — FOLATE: Folate: 12.7 ng/mL (ref >5.9)

## 2022-10-21 LAB — HEMOGLOBIN A1C: Hgb A1c MFr Bld: 6 % (ref 4.6–6.5)

## 2022-10-21 LAB — TSH: TSH: 1.23 u[IU]/mL (ref 0.35–5.50)

## 2022-10-21 MED ORDER — PREGABALIN 25 MG PO CAPS: 25.0000 mg | ORAL_CAPSULE | Freq: Three times a day (TID) | ORAL | 0 refills | Status: DC

## 2022-10-21 NOTE — Patient Instructions (Signed)
Vitamin B12 Deficiency Vitamin B12 deficiency occurs when the body does not have enough of this important vitamin. The body needs this vitamin: To make red blood cells. To make DNA. This is the genetic material inside cells. To help the nerves work properly so they can carry messages from the brain to the body. Vitamin B12 deficiency can cause health problems, such as not having enough red blood cells in the blood (anemia). This can lead to nerve damage if untreated. What are the causes? This condition may be caused by: Not eating enough foods that contain vitamin B12. Not having enough stomach acid and digestive fluids to properly absorb vitamin B12 from the food that you eat. Having certain diseases that make it hard to absorb vitamin B12. These diseases include Crohn's disease, chronic pancreatitis, and cystic fibrosis. An autoimmune disorder in which the body does not make enough of a protein (intrinsic factor) within the stomach, resulting in not enough absorption of vitamin B12. Having a surgery in which part of the stomach or small intestine is removed. Taking certain medicines that make it hard for the body to absorb vitamin B12. These include: Heartburn medicines, such as antacids and proton pump inhibitors. Some medicines that are used to treat diabetes. What increases the risk? The following factors may make you more likely to develop a vitamin B12 deficiency: Being an older adult. Eating a vegetarian or vegan diet that does not include any foods that come from animals. Eating a poor diet while you are pregnant. Taking certain medicines. Having alcoholism. What are the signs or symptoms? In some cases, there are no symptoms of this condition. If the condition leads to anemia or nerve damage, various symptoms may occur, such as: Weakness. Tiredness (fatigue). Loss of appetite. Numbness or tingling in your hands and feet. Redness and burning of the tongue. Depression,  confusion, or memory problems. Trouble walking. If anemia is severe, symptoms can include: Shortness of breath. Dizziness. Rapid heart rate. How is this diagnosed? This condition may be diagnosed with a blood test to measure the level of vitamin B12 in your blood. You may also have other tests, including: A group of tests that measure certain characteristics of blood cells (complete blood count, CBC). A blood test to measure intrinsic factor. A procedure where a thin tube with a camera on the end is used to look into your stomach or intestines (endoscopy). Other tests may be needed to discover the cause of the deficiency. How is this treated? Treatment for this condition depends on the cause. This condition may be treated by: Changing your eating and drinking habits, such as: Eating more foods that contain vitamin B12. Drinking less alcohol or no alcohol. Getting vitamin B12 injections. Taking vitamin B12 supplements by mouth (orally). Your health care provider will tell you which dose is best for you. Follow these instructions at home: Eating and drinking  Include foods in your diet that come from animals and contain a lot of vitamin B12. These include: Meats and poultry. This includes beef, pork, chicken, turkey, and organ meats, such as liver. Seafood. This includes clams, rainbow trout, salmon, tuna, and haddock. Eggs. Dairy foods such as milk, yogurt, and cheese. Eat foods that have vitamin B12 added to them (are fortified), such as ready-to-eat breakfast cereals. Check the label on the package to see if a food is fortified. The items listed above may not be a complete list of foods and beverages you can eat and drink. Contact a dietitian for   more information. Alcohol use Do not drink alcohol if: Your health care provider tells you not to drink. You are pregnant, may be pregnant, or are planning to become pregnant. If you drink alcohol: Limit how much you have to: 0-1 drink a  day for women. 0-2 drinks a day for men. Know how much alcohol is in your drink. In the [place] one drink equals one 12 oz bottle of beer (355 mL), one 5 oz glass of wine (148 mL), or one 1 oz glass of hard liquor (44 mL). General instructions Get vitamin B12 injections if told to by your health care provider. Take supplements only as told by your health care provider. Follow the directions carefully. Keep all follow-up visits. This is important. Contact a health care provider if: Your symptoms come back. Your symptoms get worse or do not improve with treatment. Get help right away: You develop shortness of breath. You have a rapid heart rate. You have chest pain. You become dizzy or you faint. These symptoms may be an emergency. Get help right away. Call 911. Do not wait to see if the symptoms will go away. Do not drive yourself to the hospital. Summary Vitamin B12 deficiency occurs when the body does not have enough of this important vitamin. Common causes include not eating enough foods that contain vitamin B12, not being able to absorb vitamin B12 from the food that you eat, having a surgery in which part of the stomach or small intestine is removed, or taking certain medicines. Eat foods that have vitamin B12 in them. Treatment may include making a change in the way you eat and drink, getting vitamin B12 injections, or taking vitamin B12 supplements. This information is not intended to replace advice given to you by your health care provider. Make sure you discuss any questions you have with your health care provider. Document Revised: 04/18/2021 Document Reviewed: 04/18/2021 Elsevier Patient Education  2023 [employer] Inc[employer]  

## 2022-10-21 NOTE — Progress Notes (Unsigned)
Subjective:  Patient ID: Julie Rivas, female    DOB: [birth year]  Age: 67 y.o. MRN: [id number]  CC: Hypertension and Diabetes   HPI [name] presents for f/up ----   She complains of a 3-week history of tingling and burning in her hands and feet.  The pain keeps her awake at night.  Gabapentin has been prescribed for low back pain but she is not taking it.  She wants to take something for symptom relief.  Outpatient Medications Prior to Visit  Medication Sig Dispense Refill   albuterol (PROVENTIL) (2.5 MG/3ML) 0.083% nebulizer solution Take 3 mLs (2.5 mg total) by nebulization every 6 (six) hours as needed for wheezing or shortness of breath. 150 mL 2   blood glucose meter kit and supplies Dispense based on patient and insurance preference. Use up to four times daily as directed. (FOR ICD-10 E10.9, E11.9). 1 each 0   budesonide-formoterol (SYMBICORT) 80-4.5 MCG/ACT inhaler Inhale 2 puffs into the lungs 2 (two) times daily. 30.6 g 1   carvedilol (COREG) 3.125 MG tablet Take 1 tablet (3.125 mg total) by mouth 2 (two) times daily with a meal. 180 tablet 0   clonazePAM (KLONOPIN) 0.5 MG tablet Take 1 tablet (0.5 mg total) by mouth 3 (three) times daily as needed. for anxiety 90 tablet 2   cyclobenzaprine (FLEXERIL) 5 MG tablet Take 1 tablet (5 mg total) by mouth 3 (three) times daily as needed for muscle spasms. 90 tablet 2   Lancets (ONETOUCH DELICA PLUS [id number]) MISC Place 1 Act onto the skin 4 (four) times daily as needed. 100 each 5   montelukast (SINGULAIR) 10 MG tablet Take 1 tablet (10 mg total) by mouth at bedtime. 90 tablet 1   ONETOUCH ULTRA test strip USE AS DIRECTED UP TO  4 TIMES DAILY TO CHECK BLOOD SUGARS 100 each 5   potassium chloride (KLOR-CON 10) 10 MEQ tablet Take 1 tablet (10 mEq total) by mouth 3 (three) times daily. 270 tablet 0   rosuvastatin (CRESTOR) 20 MG tablet Take 1 tablet (20 mg total) by mouth daily. 90 tablet 1   spironolactone (ALDACTONE) 50 MG  tablet Take 1 tablet by mouth once daily 90 tablet 0   gabapentin (NEURONTIN) 300 MG capsule Take 1 capsule by mouth at bedtime 90 capsule 0   benzonatate (TESSALON) 200 MG capsule Take 1 capsule (200 mg total) by mouth 2 (two) times daily as needed for cough. (Patient not taking: Reported on 10/21/2022) 20 capsule 2   No facility-administered medications prior to visit.    ROS Review of Systems  Constitutional: Negative.  Negative for chills, diaphoresis, fatigue and fever.  HENT: Negative.    Eyes: Negative.   Respiratory:  Negative for cough, chest tightness, shortness of breath and wheezing.   Cardiovascular:  Negative for chest pain, palpitations and leg swelling.  Gastrointestinal:  Negative for abdominal pain, diarrhea and nausea.  Endocrine: Negative.   Genitourinary: Negative.  Negative for difficulty urinating and dysuria.  Musculoskeletal: Negative.  Negative for arthralgias and myalgias.  Skin: Negative.   Neurological:  Positive for numbness. Negative for dizziness and weakness.  Hematological:  Negative for adenopathy. Does not bruise/bleed easily.  Psychiatric/Behavioral: Negative.      Objective:  BP 130/78 (BP Location: Left Arm, Patient Position: Sitting, Cuff Size: Normal)   Pulse 93   Temp 98.6 F (37 C) (Oral)   Resp 16   Ht 5' 2"$  (1.575 m)   Wt 148 lb (67.1 kg)  SpO2 96%   BMI 27.07 kg/m   BP Readings from Last 3 Encounters:  10/21/22 130/78  09/21/22 126/84  08/05/22 120/84    Wt Readings from Last 3 Encounters:  10/21/22 148 lb (67.1 kg)  09/21/22 144 lb (65.3 kg)  08/05/22 144 lb (65.3 kg)    Physical Exam Vitals reviewed.  Constitutional:      Appearance: She is obese.  HENT:     Mouth/Throat:     Mouth: Mucous membranes are moist.  Eyes:     General: No scleral icterus.    Conjunctiva/sclera: Conjunctivae normal.  Cardiovascular:     Rate and Rhythm: Normal rate and regular rhythm.     Heart sounds: No murmur heard. Pulmonary:      Effort: Pulmonary effort is normal.     Breath sounds: No stridor. No wheezing, rhonchi or rales.  Abdominal:     General: Abdomen is flat.     Palpations: There is no mass.     Tenderness: There is no abdominal tenderness. There is no guarding.     Hernia: No hernia is present.  Musculoskeletal:        General: Normal range of motion.     Cervical back: Neck supple.     Right lower leg: No edema.     Left lower leg: No edema.  Lymphadenopathy:     Cervical: No cervical adenopathy.  Skin:    General: Skin is warm and dry.     Findings: No rash.  Neurological:     General: No focal deficit present.     Mental Status: She is alert. Mental status is at baseline.     Cranial Nerves: Cranial nerves 2-12 are intact.     Sensory: Sensation is intact. No sensory deficit.     Motor: Motor function is intact.     Coordination: Coordination is intact. Romberg sign negative.     Deep Tendon Reflexes: Reflexes normal.     Reflex Scores:      Tricep reflexes are 0 on the right side and 0 on the left side.      Bicep reflexes are 1+ on the right side and 1+ on the left side.      Brachioradialis reflexes are 1+ on the right side and 1+ on the left side.      Patellar reflexes are 1+ on the right side and 1+ on the left side.      Achilles reflexes are 1+ on the right side and 1+ on the left side. Psychiatric:        Mood and Affect: Mood normal.        Behavior: Behavior normal.     Lab Results  Component Value Date   WBC 6.2 10/21/2022   HGB 14.2 10/21/2022   HCT 42.3 10/21/2022   PLT 224.0 10/21/2022   GLUCOSE 116 (H) 09/21/2022   CHOL 161 05/21/2022   TRIG 94.0 05/21/2022   HDL 41.90 05/21/2022   LDLCALC 100 (H) 05/21/2022   ALT 14 05/21/2022   AST 19 05/21/2022   NA 141 09/21/2022   K 3.4 (L) 09/21/2022   CL 102 09/21/2022   CREATININE 0.69 09/21/2022   BUN 15 09/21/2022   CO2 30 09/21/2022   TSH 1.23 10/21/2022   PSA WNL 10/30/2015   INR 1.1 (H) 12/31/2015   HGBA1C  6.0 10/21/2022   MICROALBUR 2.0 (H) 12/24/2021    MM 3D SCREEN BREAST BILATERAL  Result Date: 09/03/2022 CLINICAL DATA:  Screening.  EXAM: DIGITAL SCREENING BILATERAL MAMMOGRAM WITH TOMOSYNTHESIS AND CAD TECHNIQUE: Bilateral screening digital craniocaudal and mediolateral oblique mammograms were obtained. Bilateral screening digital breast tomosynthesis was performed. The images were evaluated with computer-aided detection. COMPARISON:  Previous exam(s). ACR Breast Density Category b: There are scattered areas of fibroglandular density. FINDINGS: There are no findings suspicious for malignancy. IMPRESSION: No mammographic evidence of malignancy. A result letter of this screening mammogram will be mailed directly to the patient. RECOMMENDATION: Screening mammogram in one year. (Code:SM-B-01Y) BI-RADS CATEGORY  1: Negative. Electronically Signed   By: Franki Cabot M.D.   On: 09/03/2022 14:59    Assessment & Plan:   Stacia was seen today for hypertension and diabetes.  Diagnoses and all orders for this visit:  Essential hypertension- Her blood pressure is well-controlled. -     TSH; Future -     TSH  Vitamin B12 deficiency neuropathy ([place])- Her B12 is in the normal range. -     Vitamin B12; Future -     CBC with Differential/Platelet; Future -     Folate; Future -     Vitamin B1; Future -     Vitamin B1 -     Folate -     CBC with Differential/Platelet -     Vitamin B12  Type II diabetes mellitus with manifestations ([place])- Her blood sugar is well-controlled. -     Hemoglobin A1c; Future -     Hemoglobin A1c  Painful diabetic neuropathy ([facility]) -     pregabalin (LYRICA) 25 MG capsule; Take 1 capsule (25 mg total) by mouth 3 (three) times daily.   I have discontinued Nahia G. Lepore's gabapentin. I am also having her start on pregabalin. Additionally, I am having her maintain her albuterol, budesonide-formoterol, carvedilol, rosuvastatin, blood glucose meter kit and supplies,  OneTouch Ultra, OneTouch Delica Plus 0000000, benzonatate, spironolactone, clonazePAM, montelukast, cyclobenzaprine, and potassium chloride.  Meds ordered this encounter  Medications   pregabalin (LYRICA) 25 MG capsule    Sig: Take 1 capsule (25 mg total) by mouth 3 (three) times daily.    Dispense:  270 capsule    Refill:  0     Follow-up: Return in about 6 months (around 04/21/2023).  Scarlette Calico, MD

## 2022-10-26 LAB — VITAMIN B1: Vitamin B1 (Thiamine): 10 nmol/L (ref 8–30)

## 2022-11-24 ENCOUNTER — Ambulatory Visit: Payer: PPO | Admitting: Internal Medicine

## 2022-12-22 ENCOUNTER — Other Ambulatory Visit: Payer: Self-pay | Admitting: Internal Medicine

## 2022-12-22 DIAGNOSIS — E876 Hypokalemia: Secondary | ICD-10-CM

## 2023-01-13 ENCOUNTER — Other Ambulatory Visit: Payer: Self-pay | Admitting: Internal Medicine

## 2023-01-13 DIAGNOSIS — J4531 Mild persistent asthma with (acute) exacerbation: Secondary | ICD-10-CM

## 2023-01-13 DIAGNOSIS — J4540 Moderate persistent asthma, uncomplicated: Secondary | ICD-10-CM

## 2023-01-27 ENCOUNTER — Telehealth: Payer: Self-pay | Admitting: Internal Medicine

## 2023-01-27 NOTE — Telephone Encounter (Signed)
Patient dropped off document Handicap Placard, to be filled out by provider. Patient requested to send it via Call Patient to pick up within 7-days. Document is located in providers tray at front office.Please advise at Mobile [phone] (mobile)

## 2023-01-27 NOTE — Telephone Encounter (Signed)
Placed in providers office to sign.

## 2023-02-10 ENCOUNTER — Other Ambulatory Visit: Payer: Self-pay | Admitting: Internal Medicine

## 2023-02-10 DIAGNOSIS — E1140 Type 2 diabetes mellitus with diabetic neuropathy, unspecified: Secondary | ICD-10-CM

## 2023-02-10 DIAGNOSIS — M5431 Sciatica, right side: Secondary | ICD-10-CM

## 2023-02-23 ENCOUNTER — Ambulatory Visit: Payer: PPO | Admitting: Internal Medicine

## 2023-03-01 ENCOUNTER — Encounter: Payer: Self-pay | Admitting: Family Medicine

## 2023-03-01 ENCOUNTER — Ambulatory Visit (INDEPENDENT_AMBULATORY_CARE_PROVIDER_SITE_OTHER): Payer: PPO | Admitting: Family Medicine

## 2023-03-01 VITALS — BP 120/82 | HR 88 | Temp 97.8°F | Resp 20 | Ht 62.0 in | Wt 144.0 lb

## 2023-03-01 DIAGNOSIS — M5442 Lumbago with sciatica, left side: Secondary | ICD-10-CM | POA: Diagnosis not present

## 2023-03-01 MED ORDER — METHYLPREDNISOLONE ACETATE 80 MG/ML IJ SUSP
80.0000 mg | Freq: Once | INTRAMUSCULAR | Status: AC
  Administered 2023-03-01: 80 mg via INTRAMUSCULAR

## 2023-03-01 NOTE — Progress Notes (Signed)
Assessment & Plan:  1. Acute left-sided low back pain with left-sided sciatica Education per bided on exercises to complete for sciatica. - methylPREDNISolone acetate (DEPO-MEDROL) injection 80 mg   Follow up plan: Return if symptoms worsen or fail to improve.  Julie Boston, MSN, APRN, FNP-C  Subjective:  HPI: Julie Rivas is a 67 y.o. female presenting on 03/01/2023 for Back Pain (Lower center of back to left - pain x 5 days. No known injury. )  Back Pain: Patient presents for presents evaluation of low back problems that started 5 days ago.  Symptoms have been present for 5 days and include pain in lumbar area that radiates down the left leg (stabbing and throbbing in character; 5/10 in severity). Initial inciting event: none. Symptoms are worst: morning. Alleviating factors identifiable by patient are recumbency and heating pad . Exacerbating factors identifiable by patient are  being up . Treatments so far initiated by patient:  Flexeril 5 mg  Previous lower back problems:  same symptoms . Previous workup: none. Previous treatments:  Flexeril .   ROS: Negative unless specifically indicated above in HPI.   Relevant past medical history reviewed and updated as indicated.   Allergies and medications reviewed and updated.   Current Outpatient Medications:    albuterol (PROVENTIL) (2.5 MG/3ML) 0.083% nebulizer solution, USE 1 VIAL IN NEBULIZER EVERY 6 HOURS AS NEEDED FOR WHEEZING FOR SHORTNESS OF BREATH, Disp: 150 mL, Rfl: 0   blood glucose meter kit and supplies, Dispense based on patient and insurance preference. Use up to four times daily as directed. (FOR ICD-10 E10.9, E11.9)., Disp: 1 each, Rfl: 0   carvedilol (COREG) 3.125 MG tablet, Take 1 tablet (3.125 mg total) by mouth 2 (two) times daily with a meal., Disp: 180 tablet, Rfl: 0   clonazePAM (KLONOPIN) 0.5 MG tablet, Take 1 tablet (0.5 mg total) by mouth 3 (three) times daily as needed. for anxiety, Disp: 90 tablet, Rfl:  2   cyclobenzaprine (FLEXERIL) 5 MG tablet, Take 1 tablet (5 mg total) by mouth 3 (three) times daily as needed for muscle spasms., Disp: 90 tablet, Rfl: 2   Lancets (ONETOUCH DELICA PLUS LANCET33G) MISC, Place 1 Act onto the skin 4 (four) times daily as needed., Disp: 100 each, Rfl: 5   montelukast (SINGULAIR) 10 MG tablet, Take 1 tablet (10 mg total) by mouth at bedtime., Disp: 90 tablet, Rfl: 1   ONETOUCH ULTRA test strip, USE AS DIRECTED UP TO  4 TIMES DAILY TO CHECK BLOOD SUGARS, Disp: 100 each, Rfl: 5   potassium chloride (KLOR-CON) 10 MEQ tablet, TAKE 1 TABLET BY MOUTH THREE TIMES DAILY, Disp: 270 tablet, Rfl: 0   pregabalin (LYRICA) 25 MG capsule, TAKE 1 CAPSULE BY MOUTH THREE TIMES DAILY, Disp: 270 capsule, Rfl: 0   rosuvastatin (CRESTOR) 20 MG tablet, Take 1 tablet (20 mg total) by mouth daily., Disp: 90 tablet, Rfl: 1   spironolactone (ALDACTONE) 50 MG tablet, Take 1 tablet by mouth once daily, Disp: 90 tablet, Rfl: 0   benzonatate (TESSALON) 200 MG capsule, Take 1 capsule (200 mg total) by mouth 2 (two) times daily as needed for cough. (Patient not taking: Reported on 10/21/2022), Disp: 20 capsule, Rfl: 2  Allergies  Allergen Reactions   Codeine Shortness Of Breath and Swelling   Metformin And Related Nausea And Vomiting    Objective:   BP 120/82   Pulse 88   Temp 97.8 F (36.6 C)   Resp 20   Ht 5\' 2"  (  1.575 m)   Wt 144 lb (65.3 kg)   BMI 26.34 kg/m    Physical Exam Vitals reviewed.  Constitutional:      General: She is not in acute distress.    Appearance: Normal appearance. She is not ill-appearing, toxic-appearing or diaphoretic.  HENT:     Head: Normocephalic and atraumatic.  Eyes:     General: No scleral icterus.       Right eye: No discharge.        Left eye: No discharge.     Conjunctiva/sclera: Conjunctivae normal.  Cardiovascular:     Rate and Rhythm: Normal rate.  Pulmonary:     Effort: Pulmonary effort is normal. No respiratory distress.   Musculoskeletal:     Cervical back: Normal range of motion.     Lumbar back: Tenderness and bony tenderness present. Positive left straight leg raise test. Negative right straight leg raise test.  Skin:    General: Skin is warm and dry.     Capillary Refill: Capillary refill takes less than 2 seconds.  Neurological:     General: No focal deficit present.     Mental Status: She is alert and oriented to person, place, and time. Mental status is at baseline.  Psychiatric:        Mood and Affect: Mood normal.        Behavior: Behavior normal.        Thought Content: Thought content normal.        Judgment: Judgment normal.

## 2023-03-17 ENCOUNTER — Encounter: Payer: Self-pay | Admitting: Internal Medicine

## 2023-03-17 ENCOUNTER — Ambulatory Visit (INDEPENDENT_AMBULATORY_CARE_PROVIDER_SITE_OTHER): Payer: PPO | Admitting: Internal Medicine

## 2023-03-17 ENCOUNTER — Ambulatory Visit (INDEPENDENT_AMBULATORY_CARE_PROVIDER_SITE_OTHER): Payer: PPO

## 2023-03-17 VITALS — BP 124/68 | HR 100 | Temp 98.6°F | Ht 62.0 in | Wt 142.0 lb

## 2023-03-17 DIAGNOSIS — M5416 Radiculopathy, lumbar region: Secondary | ICD-10-CM

## 2023-03-17 DIAGNOSIS — G8929 Other chronic pain: Secondary | ICD-10-CM

## 2023-03-17 DIAGNOSIS — M5441 Lumbago with sciatica, right side: Secondary | ICD-10-CM | POA: Diagnosis not present

## 2023-03-17 DIAGNOSIS — M5442 Lumbago with sciatica, left side: Secondary | ICD-10-CM | POA: Diagnosis not present

## 2023-03-17 DIAGNOSIS — R29898 Other symptoms and signs involving the musculoskeletal system: Secondary | ICD-10-CM | POA: Diagnosis not present

## 2023-03-17 DIAGNOSIS — M5136 Other intervertebral disc degeneration, lumbar region: Secondary | ICD-10-CM | POA: Diagnosis not present

## 2023-03-17 DIAGNOSIS — M545 Low back pain, unspecified: Secondary | ICD-10-CM | POA: Diagnosis not present

## 2023-03-17 DIAGNOSIS — M4316 Spondylolisthesis, lumbar region: Secondary | ICD-10-CM | POA: Diagnosis not present

## 2023-03-17 DIAGNOSIS — M47816 Spondylosis without myelopathy or radiculopathy, lumbar region: Secondary | ICD-10-CM | POA: Diagnosis not present

## 2023-03-17 MED ORDER — TIZANIDINE HCL 4 MG PO TABS: 4.0000 mg | ORAL_TABLET | Freq: Three times a day (TID) | ORAL | 2 refills | Status: DC | PRN

## 2023-03-17 NOTE — Progress Notes (Signed)
Subjective:  Patient ID: Julie Rivas, female    DOB: [birth year]  Age: 67 y.o. MRN: [id number]  CC: Back Pain   HPI Julie Rivas presents for f/up ----    Discussed the use of AI scribe software for clinical note transcription with the patient, who gave verbal consent to proceed.  History of Present Illness   The patient, with a known history of lumbar nerve issues, diabetes, and neuropathy, presents with a recurrence of lower back pain that has been ongoing for approximately three to four weeks. The pain, described as spasmodic, originates in the lumbar region and radiates down either leg. The patient reports that a previous corticosteroid injection provided partial, but temporary relief.  The patient also reports a tingling sensation in the feet with BLE weakness attributed to neuropathy. However, she does note occasional balance issues. The patient's current pain management regimen includes a generic form of Flexeril (5mg ), which she reports is no longer effective.       Outpatient Medications Prior to Visit  Medication Sig Dispense Refill   albuterol (PROVENTIL) (2.5 MG/3ML) 0.083% nebulizer solution USE 1 VIAL IN NEBULIZER EVERY 6 HOURS AS NEEDED FOR WHEEZING FOR SHORTNESS OF BREATH 150 mL 0   benzonatate (TESSALON) 200 MG capsule Take 1 capsule (200 mg total) by mouth 2 (two) times daily as needed for cough. 20 capsule 2   blood glucose meter kit and supplies Dispense based on patient and insurance preference. Use up to four times daily as directed. (FOR ICD-10 E10.9, E11.9). 1 each 0   carvedilol (COREG) 3.125 MG tablet Take 1 tablet (3.125 mg total) by mouth 2 (two) times daily with a meal. 180 tablet 0   clonazePAM (KLONOPIN) 0.5 MG tablet Take 1 tablet (0.5 mg total) by mouth 3 (three) times daily as needed. for anxiety 90 tablet 2   Lancets (ONETOUCH DELICA PLUS LANCET33G) MISC Place 1 Act onto the skin 4 (four) times daily as needed. 100 each 5   montelukast (SINGULAIR)  10 MG tablet Take 1 tablet (10 mg total) by mouth at bedtime. 90 tablet 1   ONETOUCH ULTRA test strip USE AS DIRECTED UP TO  4 TIMES DAILY TO CHECK BLOOD SUGARS 100 each 5   potassium chloride (KLOR-CON) 10 MEQ tablet TAKE 1 TABLET BY MOUTH THREE TIMES DAILY 270 tablet 0   pregabalin (LYRICA) 25 MG capsule TAKE 1 CAPSULE BY MOUTH THREE TIMES DAILY 270 capsule 0   rosuvastatin (CRESTOR) 20 MG tablet Take 1 tablet (20 mg total) by mouth daily. 90 tablet 1   spironolactone (ALDACTONE) 50 MG tablet Take 1 tablet by mouth once daily 90 tablet 0   cyclobenzaprine (FLEXERIL) 5 MG tablet Take 1 tablet (5 mg total) by mouth 3 (three) times daily as needed for muscle spasms. 90 tablet 2   No facility-administered medications prior to visit.    ROS Review of Systems  Constitutional: Negative.  Negative for chills, diaphoresis, fatigue and fever.  HENT: Negative.    Eyes: Negative.   Respiratory:  Negative for cough, chest tightness, shortness of breath and wheezing.   Cardiovascular:  Negative for chest pain, palpitations and leg swelling.  Gastrointestinal:  Negative for abdominal pain, constipation, diarrhea, nausea and vomiting.  Endocrine: Negative.   Genitourinary: Negative.  Negative for difficulty urinating.  Musculoskeletal:  Positive for back pain. Negative for arthralgias, joint swelling and myalgias.  Skin: Negative.   Neurological:  Positive for weakness. Negative for dizziness, seizures, light-headedness and headaches.  Hematological:  Negative for adenopathy. Does not bruise/bleed easily.  Psychiatric/Behavioral: Negative.      Objective:  BP 124/68 (BP Location: Left Arm, Patient Position: Sitting, Cuff Size: Large)   Pulse 100   Temp 98.6 F (37 C) (Oral)   Ht 5\' 2"  (1.575 m)   Wt 142 lb (64.4 kg)   SpO2 95%   BMI 25.97 kg/m   BP Readings from Last 3 Encounters:  03/17/23 124/68  03/01/23 120/82  10/21/22 130/78    Wt Readings from Last 3 Encounters:  03/17/23 142  lb (64.4 kg)  03/01/23 144 lb (65.3 kg)  10/21/22 148 lb (67.1 kg)    Physical Exam Vitals reviewed.  Constitutional:      Appearance: Normal appearance.  HENT:     Mouth/Throat:     Mouth: Mucous membranes are moist.  Eyes:     General: No scleral icterus.    Conjunctiva/sclera: Conjunctivae normal.  Cardiovascular:     Rate and Rhythm: Normal rate and regular rhythm.     Heart sounds: No murmur heard.    No gallop.  Pulmonary:     Effort: Pulmonary effort is normal.     Breath sounds: No stridor. No wheezing, rhonchi or rales.  Abdominal:     General: Abdomen is flat.     Palpations: There is no mass.     Tenderness: There is no abdominal tenderness. There is no guarding.     Hernia: No hernia is present.  Musculoskeletal:        General: Normal range of motion.     Cervical back: Neck supple.     Right lower leg: No edema.     Left lower leg: No edema.  Lymphadenopathy:     Cervical: No cervical adenopathy.  Skin:    General: Skin is warm and dry.     Coloration: Skin is pale.  Neurological:     Mental Status: She is alert.     Sensory: No sensory deficit.     Motor: Weakness present. No atrophy or abnormal muscle tone.     Gait: Gait abnormal.     Deep Tendon Reflexes: Reflexes normal.     Reflex Scores:      Tricep reflexes are 1+ on the right side and 1+ on the left side.      Bicep reflexes are 1+ on the right side and 1+ on the left side.      Brachioradialis reflexes are 1+ on the right side and 1+ on the left side.      Patellar reflexes are 2+ on the right side and 2+ on the left side.      Achilles reflexes are 1+ on the right side and 1+ on the left side. Psychiatric:        Mood and Affect: Mood normal.        Behavior: Behavior normal.     Lab Results  Component Value Date   WBC 6.2 10/21/2022   HGB 14.2 10/21/2022   HCT 42.3 10/21/2022   PLT 224.0 10/21/2022   GLUCOSE 116 (H) 09/21/2022   CHOL 161 05/21/2022   TRIG 94.0 05/21/2022   HDL  41.90 05/21/2022   LDLCALC 100 (H) 05/21/2022   ALT 14 05/21/2022   AST 19 05/21/2022   NA 141 09/21/2022   K 3.4 (L) 09/21/2022   CL 102 09/21/2022   CREATININE 0.69 09/21/2022   BUN 15 09/21/2022   CO2 30 09/21/2022   TSH 1.23 10/21/2022  PSA WNL 10/30/2015   INR 1.1 (H) 12/31/2015   HGBA1C 6.0 10/21/2022   MICROALBUR 2.0 (H) 12/24/2021    MM 3D SCREEN BREAST BILATERAL  Result Date: 09/03/2022 CLINICAL DATA:  Screening. EXAM: DIGITAL SCREENING BILATERAL MAMMOGRAM WITH TOMOSYNTHESIS AND CAD TECHNIQUE: Bilateral screening digital craniocaudal and mediolateral oblique mammograms were obtained. Bilateral screening digital breast tomosynthesis was performed. The images were evaluated with computer-aided detection. COMPARISON:  Previous exam(s). ACR Breast Density Category b: There are scattered areas of fibroglandular density. FINDINGS: There are no findings suspicious for malignancy. IMPRESSION: No mammographic evidence of malignancy. A result letter of this screening mammogram will be mailed directly to the patient. RECOMMENDATION: Screening mammogram in one year. (Code:[id number]) BI-RADS CATEGORY  1: Negative. Electronically Signed   By: Bary Richard M.D.   On: 09/03/2022 14:59   DG Lumbar Spine Complete  Result Date: 03/17/2023 CLINICAL DATA:  Low back pain. EXAM: LUMBAR SPINE - COMPLETE 4+ VIEW COMPARISON:  None Available. FINDINGS: Five lumbar type vertebra. There is no acute fracture or subluxation of the lumbar spine. There is grade 1 L4-L5 anterolisthesis. Multilevel degenerative changes with disc space narrowing and spurring most prominent at L4-L5 and L5-S1. Lower lumbar facet arthropathy. The visualized posterior elements are intact. The soft tissues are unremarkable. IMPRESSION: 1. No acute findings. 2. Degenerative changes. Electronically Signed   By: Elgie Collard M.D.   On: 03/17/2023 15:42     Assessment & Plan:   Lumbar radiculitis- Plain films are positive for DDD.   Will treat with tizanidine. -     DG Lumbar Spine Complete; Future -     tiZANidine HCl; Take 1 tablet (4 mg total) by mouth every 8 (eight) hours as needed for muscle spasms.  Dispense: 90 tablet; Refill: 2 -     MR LUMBAR SPINE WO CONTRAST; Future  Chronic bilateral low back pain with bilateral sciatica -     DG Lumbar Spine Complete; Future -     tiZANidine HCl; Take 1 tablet (4 mg total) by mouth every 8 (eight) hours as needed for muscle spasms.  Dispense: 90 tablet; Refill: 2 -     MR LUMBAR SPINE WO CONTRAST; Future  Leg weakness, bilateral- I recommended that she undergo an MRI of the lumbar spine to evaluate for spinal cord lesion, spinal stenosis, disc herniation, nerve impingement. -     MR LUMBAR SPINE WO CONTRAST; Future     Follow-up: Return in about 3 months (around 06/17/2023).  Sanda Linger, MD

## 2023-03-17 NOTE — Patient Instructions (Signed)

## 2023-03-22 ENCOUNTER — Ambulatory Visit: Payer: PPO | Admitting: Internal Medicine

## 2023-03-23 ENCOUNTER — Ambulatory Visit: Admission: RE | Admit: 2023-03-23 | Payer: PPO | Source: Ambulatory Visit

## 2023-03-23 DIAGNOSIS — G8929 Other chronic pain: Secondary | ICD-10-CM

## 2023-03-23 DIAGNOSIS — M4316 Spondylolisthesis, lumbar region: Secondary | ICD-10-CM | POA: Diagnosis not present

## 2023-03-23 DIAGNOSIS — R29898 Other symptoms and signs involving the musculoskeletal system: Secondary | ICD-10-CM

## 2023-03-23 DIAGNOSIS — M5416 Radiculopathy, lumbar region: Secondary | ICD-10-CM

## 2023-03-23 DIAGNOSIS — M5116 Intervertebral disc disorders with radiculopathy, lumbar region: Secondary | ICD-10-CM | POA: Diagnosis not present

## 2023-03-23 DIAGNOSIS — M4726 Other spondylosis with radiculopathy, lumbar region: Secondary | ICD-10-CM | POA: Diagnosis not present

## 2023-03-23 DIAGNOSIS — M48061 Spinal stenosis, lumbar region without neurogenic claudication: Secondary | ICD-10-CM | POA: Diagnosis not present

## 2023-03-24 LAB — HM DIABETES EYE EXAM

## 2023-03-26 ENCOUNTER — Other Ambulatory Visit: Payer: PPO

## 2023-04-01 ENCOUNTER — Other Ambulatory Visit: Payer: Self-pay | Admitting: Internal Medicine

## 2023-04-01 DIAGNOSIS — M48061 Spinal stenosis, lumbar region without neurogenic claudication: Secondary | ICD-10-CM

## 2023-04-08 DIAGNOSIS — Z6825 Body mass index (BMI) 25.0-25.9, adult: Secondary | ICD-10-CM | POA: Diagnosis not present

## 2023-04-08 DIAGNOSIS — M4316 Spondylolisthesis, lumbar region: Secondary | ICD-10-CM | POA: Diagnosis not present

## 2023-04-22 ENCOUNTER — Ambulatory Visit (INDEPENDENT_AMBULATORY_CARE_PROVIDER_SITE_OTHER): Payer: PPO

## 2023-04-22 ENCOUNTER — Encounter: Payer: Self-pay | Admitting: Internal Medicine

## 2023-04-22 ENCOUNTER — Ambulatory Visit (INDEPENDENT_AMBULATORY_CARE_PROVIDER_SITE_OTHER): Payer: PPO | Admitting: Internal Medicine

## 2023-04-22 VITALS — BP 116/68 | HR 100 | Temp 98.1°F | Ht 62.0 in | Wt 140.0 lb

## 2023-04-22 DIAGNOSIS — R058 Other specified cough: Secondary | ICD-10-CM

## 2023-04-22 DIAGNOSIS — E538 Deficiency of other specified B group vitamins: Secondary | ICD-10-CM

## 2023-04-22 DIAGNOSIS — R7303 Prediabetes: Secondary | ICD-10-CM

## 2023-04-22 DIAGNOSIS — E876 Hypokalemia: Secondary | ICD-10-CM | POA: Diagnosis not present

## 2023-04-22 DIAGNOSIS — R059 Cough, unspecified: Secondary | ICD-10-CM | POA: Diagnosis not present

## 2023-04-22 DIAGNOSIS — R0602 Shortness of breath: Secondary | ICD-10-CM | POA: Diagnosis not present

## 2023-04-22 DIAGNOSIS — I10 Essential (primary) hypertension: Secondary | ICD-10-CM | POA: Diagnosis not present

## 2023-04-22 DIAGNOSIS — N1832 Chronic kidney disease, stage 3b: Secondary | ICD-10-CM | POA: Diagnosis not present

## 2023-04-22 DIAGNOSIS — R519 Headache, unspecified: Secondary | ICD-10-CM | POA: Diagnosis not present

## 2023-04-22 DIAGNOSIS — G63 Polyneuropathy in diseases classified elsewhere: Secondary | ICD-10-CM | POA: Diagnosis not present

## 2023-04-22 DIAGNOSIS — R3129 Other microscopic hematuria: Secondary | ICD-10-CM

## 2023-04-22 MED ORDER — CYANOCOBALAMIN 1000 MCG/ML IJ SOLN
1000.0000 ug | Freq: Once | INTRAMUSCULAR | Status: AC
  Administered 2023-04-22: 1000 ug via INTRAMUSCULAR

## 2023-04-22 NOTE — Progress Notes (Unsigned)
Subjective:  Patient ID: Julie Rivas, female    DOB: 05-Aug-1956  Age: 67 y.o. MRN: [id number]  CC: Cough and Headache   HPI Julie Rivas presents for f/up ----  Discussed the use of AI scribe software for clinical note transcription with the patient, who gave verbal consent to proceed.  History of Present Illness   The patient has been experiencing a persistent headache for the past three weeks, which is described as causing nausea, dizziness, and lightheadedness. This is a new type of headache for the patient, who has previously only experienced 'normal' headaches and has no history of migraines. The headache has been severe enough to affect the patient's daily activities, causing them to sleep more than usual. The patient has also been experiencing a cough, which exacerbates the headache, but denies any production of blood or phlegm.  In addition to the headache and cough, the patient reports feeling off balance for the past couple of weeks, which they initially attributed to their back condition. They are scheduled to start injections for their back condition soon. The patient also mentions some changes in vision, but attributes this to needing new glasses, which they recently obtained.  The patient denies any changes in speech, numbness, weakness, or tingling, and does not report any trouble walking or talking. They also deny any facial pain, aside from some discomfort in the sinus area. The patient has not vomited, but reports feeling like they want to.  The patient has a history of abnormal EKGs, with the most recent one in January. They also report some shortness of breath. The patient was due for a B12 shot, which they had been receiving every four weeks.       Outpatient Medications Prior to Visit  Medication Sig Dispense Refill   albuterol (PROVENTIL) (2.5 MG/3ML) 0.083% nebulizer solution USE 1 VIAL IN NEBULIZER EVERY 6 HOURS AS NEEDED FOR WHEEZING FOR SHORTNESS OF BREATH  150 mL 0   blood glucose meter kit and supplies Dispense based on patient and insurance preference. Use up to four times daily as directed. (FOR ICD-10 E10.9, E11.9). 1 each 0   Lancets (ONETOUCH DELICA PLUS LANCET33G) MISC Place 1 Act onto the skin 4 (four) times daily as needed. 100 each 5   montelukast (SINGULAIR) 10 MG tablet Take 1 tablet (10 mg total) by mouth at bedtime. 90 tablet 1   ONETOUCH ULTRA test strip USE AS DIRECTED UP TO  4 TIMES DAILY TO CHECK BLOOD SUGARS 100 each 5   pregabalin (LYRICA) 25 MG capsule TAKE 1 CAPSULE BY MOUTH THREE TIMES DAILY 270 capsule 0   rosuvastatin (CRESTOR) 20 MG tablet Take 1 tablet (20 mg total) by mouth daily. 90 tablet 1   spironolactone (ALDACTONE) 50 MG tablet Take 1 tablet by mouth once daily 90 tablet 0   tiZANidine (ZANAFLEX) 4 MG tablet Take 1 tablet (4 mg total) by mouth every 8 (eight) hours as needed for muscle spasms. 90 tablet 2   benzonatate (TESSALON) 200 MG capsule Take 1 capsule (200 mg total) by mouth 2 (two) times daily as needed for cough. 20 capsule 2   carvedilol (COREG) 3.125 MG tablet Take 1 tablet (3.125 mg total) by mouth 2 (two) times daily with a meal. 180 tablet 0   clonazePAM (KLONOPIN) 0.5 MG tablet Take 1 tablet (0.5 mg total) by mouth 3 (three) times daily as needed. for anxiety 90 tablet 2   potassium chloride (KLOR-CON) 10 MEQ tablet TAKE 1 TABLET  BY MOUTH THREE TIMES DAILY 270 tablet 0   No facility-administered medications prior to visit.    ROS Review of Systems  Constitutional:  Negative for appetite change, diaphoresis, fatigue and unexpected weight change.  HENT: Negative.    Eyes: Negative.   Respiratory:  Positive for cough. Negative for chest tightness, shortness of breath and wheezing.   Cardiovascular:  Negative for chest pain, palpitations and leg swelling.  Gastrointestinal:  Positive for nausea. Negative for abdominal pain, anal bleeding, blood in stool, diarrhea and vomiting.  Genitourinary:  Negative.  Negative for decreased urine volume, difficulty urinating, dysuria, hematuria and urgency.  Musculoskeletal:  Positive for gait problem. Negative for arthralgias, back pain, myalgias and neck pain.  Skin: Negative.   Neurological:  Positive for dizziness, light-headedness and headaches. Negative for weakness and numbness.  Hematological:  Negative for adenopathy. Does not bruise/bleed easily.  Psychiatric/Behavioral: Negative.      Objective:  BP 116/68 (BP Location: Right Arm, Patient Position: Sitting, Cuff Size: Normal)   Pulse 100   Temp 98.1 F (36.7 C) (Oral)   Ht 5\' 2"  (1.575 m)   Wt 140 lb (63.5 kg)   SpO2 97%   BMI 25.61 kg/m   BP Readings from Last 3 Encounters:  04/22/23 116/68  03/17/23 124/68  03/01/23 120/82    Wt Readings from Last 3 Encounters:  04/22/23 140 lb (63.5 kg)  03/17/23 142 lb (64.4 kg)  03/01/23 144 lb (65.3 kg)    Physical Exam Vitals reviewed.  Constitutional:      General: She is not in acute distress.    Appearance: She is ill-appearing. She is not toxic-appearing or diaphoretic.  HENT:     Mouth/Throat:     Mouth: Mucous membranes are moist.  Eyes:     General: No scleral icterus.    Extraocular Movements: Extraocular movements intact.     Pupils: Pupils are equal, round, and reactive to light.  Cardiovascular:     Rate and Rhythm: Normal rate and regular rhythm.     Heart sounds: No murmur heard. Pulmonary:     Effort: Pulmonary effort is normal.     Breath sounds: No stridor. No wheezing, rhonchi or rales.  Abdominal:     General: Abdomen is flat.     Palpations: There is no mass.     Tenderness: There is no abdominal tenderness. There is no guarding.     Hernia: No hernia is present.  Musculoskeletal:        General: Normal range of motion.     Cervical back: Neck supple.     Right lower leg: No edema.     Left lower leg: No edema.  Lymphadenopathy:     Cervical: No cervical adenopathy.  Skin:    General:  Skin is warm and dry.     Findings: No lesion or rash.  Neurological:     General: No focal deficit present.     Mental Status: She is alert. Mental status is at baseline.     Sensory: Sensation is intact.     Motor: Motor function is intact. No weakness or atrophy.     Coordination: Romberg sign positive. Coordination normal. Finger-Nose-Finger Test and Heel to [facility] Test normal. Rapid alternating movements normal.     Gait: Gait is intact.     Deep Tendon Reflexes: Reflexes normal. Babinski sign absent on the right side. Babinski sign absent on the left side.     Reflex Scores:  Tricep reflexes are 1+ on the right side and 1+ on the left side.      Bicep reflexes are 1+ on the right side and 1+ on the left side.      Brachioradialis reflexes are 1+ on the right side and 1+ on the left side.      Patellar reflexes are 1+ on the right side and 1+ on the left side.      Achilles reflexes are 0 on the right side and 0 on the left side. Psychiatric:        Mood and Affect: Mood normal.        Behavior: Behavior normal.        Thought Content: Thought content normal.        Judgment: Judgment normal.     Lab Results  Component Value Date   WBC 6.2 10/21/2022   HGB 14.2 10/21/2022   HCT 42.3 10/21/2022   PLT 224.0 10/21/2022   GLUCOSE 86 04/22/2023   CHOL 161 05/21/2022   TRIG 94.0 05/21/2022   HDL 41.90 05/21/2022   LDLCALC 100 (H) 05/21/2022   ALT 14 05/21/2022   AST 19 05/21/2022   NA 133 (L) 04/22/2023   K 2.9 (L) 04/22/2023   CL 97 04/22/2023   CREATININE 0.72 04/22/2023   BUN 12 04/22/2023   CO2 29 04/22/2023   TSH 1.23 10/21/2022   PSA WNL 10/30/2015   INR 1.1 (H) 12/31/2015   HGBA1C 6.2 04/22/2023   MICROALBUR 9.0 (H) 04/22/2023    MR Lumbar Spine Wo Contrast  Result Date: 04/01/2023 CLINICAL DATA:  Lumbar radiculopathy, symptoms persist with > 6 wks treatment. EXAM: MRI LUMBAR SPINE WITHOUT CONTRAST TECHNIQUE: Multiplanar, multisequence MR imaging of the  lumbar spine was performed. No intravenous contrast was administered. COMPARISON:  Lumbar spine radiographs 03/17/2023 FINDINGS: Segmentation:  Standard. Alignment: Mild lumbar dextroscoliosis. 6 mm anterolisthesis of L4 on L5. Vertebrae:  No fracture or suspicious marrow lesion. Conus medullaris and cauda equina: Conus extends to the L1-2 level. Conus and cauda equina appear normal. Paraspinal and other soft tissues: Unremarkable. Disc levels: Disc desiccation throughout the lumbar spine. Moderate disc space narrowing at L4-5 greater than L5-S1. T10-11 and T11-12: Only imaged sagittally. Mild disc bulging at each level without evidence of significant stenosis. T12-L1: Minimal disc bulging and mild facet hypertrophy without stenosis. L1-2: Mild disc bulging and mild-to-moderate facet hypertrophy without stenosis. L2-3: Disc bulging and moderate facet and ligamentum flavum hypertrophy without stenosis. L3-4: Disc bulging and severe facet and ligamentum flavum hypertrophy result in moderate spinal stenosis without neural foraminal stenosis. L4-5: Anterolisthesis with bulging uncovered disc and severe facet and ligamentum flavum hypertrophy result in severe spinal stenosis, asymmetric left lateral recess stenosis, and mild bilateral neural foraminal stenosis. L5-S1: Mild disc bulging, endplate spurring, and mild-to-moderate right greater than left facet hypertrophy without stenosis. IMPRESSION: 1. Multilevel lumbar disc and facet degeneration, most notable at L4-5 where there is grade 1 anterolisthesis and severe spinal stenosis. 2. Moderate spinal stenosis at L3-4. Electronically Signed   By: Sebastian Ache M.D.   On: 04/01/2023 16:06   DG Chest 2 View  Result Date: 04/22/2023 CLINICAL DATA:  Cough, shortness of breath EXAM: CHEST - 2 VIEW COMPARISON:  08/05/2022 FINDINGS: The heart size and mediastinal contours are within normal limits. Both lungs are clear. Disc degenerative disease of the thoracic spine.  IMPRESSION: No acute abnormality of the lungs. Electronically Signed   By: Jearld Lesch M.D.   On: 04/22/2023 17:33  Assessment & Plan:   Cough productive of purulent sputum- Chest x-ray is negative for mass or infiltrate. -     DG Chest 2 View; Future  Vitamin B12 deficiency neuropathy (HCC) -     Cyanocobalamin  Prediabetes -     Basic metabolic panel; Future -     Hemoglobin A1c; Future -     Microalbumin / creatinine urine ratio; Future  Essential hypertension -     Basic metabolic panel; Future -     Microalbumin / creatinine urine ratio; Future -     Urinalysis, Routine w reflex microscopic; Future -     Potassium Chloride ER; Take 1 tablet (10 mEq total) by mouth 3 (three) times daily.  Dispense: 270 tablet; Refill: 0  Stage 3b chronic kidney disease (HCC) -     Microalbumin / creatinine urine ratio; Future -     Urinalysis, Routine w reflex microscopic; Future  New onset of headaches after age 34- Will evaluate for NPH, mass, CVA, bleed. -     MR BRAIN WO CONTRAST; Future  Other microscopic hematuria - Will evaluate for renal cell carcinoma, nephrolithiasis, and bladder cancer. -     CT RENAL STONE STUDY; Future  Chronic hypokalemia -     Potassium Chloride ER; Take 1 tablet (10 mEq total) by mouth 3 (three) times daily.  Dispense: 270 tablet; Refill: 0     Follow-up: Return in about 3 months (around 07/23/2023).  Sanda Linger, MD

## 2023-04-22 NOTE — Patient Instructions (Signed)
General Headache Without Cause A headache is pain or discomfort felt around the head or neck area. There are many causes and types of headaches. A few common types include: Tension headaches. Migraine headaches. Cluster headaches. Chronic daily headaches. Sometimes, the specific cause of a headache may not be found. Follow these instructions at home: Watch your condition for any changes. Let your health care provider know about them. Take these steps to help with your condition: Managing pain     Take over-the-counter and prescription medicines only as told by your health care provider. Treatment may include medicines for pain that are taken by mouth or applied to the skin. Lie down in a dark, quiet room when you have a headache. Keep lights dim if bright lights bother you or make your headaches worse. If directed, put ice on your head and neck area: Put ice in a plastic bag. Place a towel between your skin and the bag. Leave the ice on for 20 minutes, 2-3 times per day. Remove the ice if your skin turns bright red. This is very important. If you cannot feel pain, heat, or cold, you have a greater risk of damage to the area. If directed, apply heat to the affected area. Use the heat source that your health care provider recommends, such as a moist heat pack or a heating pad. Place a towel between your skin and the heat source. Leave the heat on for 20-30 minutes. Remove the heat if your skin turns bright red. This is especially important if you are unable to feel pain, heat, or cold. You have a greater risk of getting burned. Eating and drinking Eat meals on a regular schedule. If you drink alcohol: Limit how much you have to: 0-1 drink a day for women who are not pregnant. 0-2 drinks a day for men. Know how much alcohol is in a drink. In the [place] one drink equals one 12 oz bottle of beer (355 mL), one 5 oz glass of wine (148 mL), or one 1 oz glass of hard liquor (44 mL). Stop  drinking caffeine, or decrease the amount of caffeine you drink. Drink enough fluid to keep your urine pale yellow. General instructions  Keep a headache journal to help find out what may trigger your headaches. For example, write down: What you eat and drink. How much sleep you get. Any change to your diet or medicines. Try massage or other relaxation techniques. Limit stress. Sit up straight, and do not tense your muscles. Do not use any products that contain nicotine or tobacco. These products include cigarettes, chewing tobacco, and vaping devices, such as e-cigarettes. If you need help quitting, ask your health care provider. Exercise regularly as told by your health care provider. Sleep on a regular schedule. Get 7-9 hours of sleep each night, or the amount recommended by your health care provider. Keep all follow-up visits. This is important. Contact a health care provider if: Medicine does not help your symptoms. You have a headache that is different from your usual headache. You have nausea or you vomit. You have a fever. Get help right away if: Your headache: Becomes severe quickly. Gets worse after moderate to intense physical activity. You have any of these symptoms: Repeated vomiting. Pain or stiffness in your neck. Changes to your vision. Pain in an eye or ear. Problems with speech. Muscular weakness or loss of muscle control. Loss of balance or coordination. You feel faint or pass out. You have confusion. You have   a seizure. These symptoms may represent a serious problem that is an emergency. Do not wait to see if the symptoms will go away. Get medical help right away. Call your local emergency services (911 in the [place] Do not drive yourself to the hospital. Summary A headache is pain or discomfort felt around the head or neck area. There are many causes and types of headaches. In some cases, the cause may not be found. Keep a headache journal to help find out  what may trigger your headaches. Watch your condition for any changes. Let your health care provider know about them. Contact a health care provider if you have a headache that is different from the usual headache, or if your symptoms are not helped by medicine. Get help right away if your headache becomes severe, you vomit, you have a loss of vision, you lose your balance, or you have a seizure. This information is not intended to replace advice given to you by your health care provider. Make sure you discuss any questions you have with your health care provider. Document Revised: 01/22/2021 Document Reviewed: 01/22/2021 Elsevier Patient Education  2024 [employer].

## 2023-04-23 DIAGNOSIS — R3129 Other microscopic hematuria: Secondary | ICD-10-CM | POA: Insufficient documentation

## 2023-04-23 LAB — URINALYSIS, ROUTINE W REFLEX MICROSCOPIC
Nitrite: NEGATIVE
Specific Gravity, Urine: 1.025 (ref 1.000–1.030)
Total Protein, Urine: 100 — AB
Urine Glucose: NEGATIVE
Urobilinogen, UA: 1 (ref 0.0–1.0)
pH: 6 (ref 5.0–8.0)

## 2023-04-23 LAB — BASIC METABOLIC PANEL
BUN: 12 mg/dL (ref 6–23)
CO2: 29 meq/L (ref 19–32)
Calcium: 9.4 mg/dL (ref 8.4–10.5)
Chloride: 97 meq/L (ref 96–112)
Creatinine, Ser: 0.72 mg/dL (ref 0.40–1.20)
GFR: 85.44 mL/min (ref >60.00)
Glucose, Bld: 86 mg/dL (ref 70–99)
Potassium: 2.9 meq/L — ABNORMAL LOW (ref 3.5–5.1)
Sodium: 133 meq/L — ABNORMAL LOW (ref 135–145)

## 2023-04-23 LAB — HEMOGLOBIN A1C: Hgb A1c MFr Bld: 6.2 % (ref 4.6–6.5)

## 2023-04-23 LAB — MICROALBUMIN / CREATININE URINE RATIO
Creatinine,U: 335.2 mg/dL
Microalb Creat Ratio: 2.7 mg/g (ref 0.0–30.0)
Microalb, Ur: 9 mg/dL — ABNORMAL HIGH (ref 0.0–1.9)

## 2023-04-23 MED ORDER — POTASSIUM CHLORIDE ER 10 MEQ PO TBCR
10.0000 meq | EXTENDED_RELEASE_TABLET | Freq: Three times a day (TID) | ORAL | 0 refills | Status: DC
Start: 2023-04-23 — End: 2023-07-20

## 2023-04-26 DIAGNOSIS — M4316 Spondylolisthesis, lumbar region: Secondary | ICD-10-CM | POA: Diagnosis not present

## 2023-04-26 DIAGNOSIS — M5416 Radiculopathy, lumbar region: Secondary | ICD-10-CM | POA: Diagnosis not present

## 2023-04-27 ENCOUNTER — Ambulatory Visit: Payer: PPO | Admitting: Family Medicine

## 2023-04-27 ENCOUNTER — Ambulatory Visit
Admission: RE | Admit: 2023-04-27 | Discharge: 2023-04-27 | Disposition: A | Discharge status: Home / Self Care | Payer: PPO | Source: Ambulatory Visit | Attending: Internal Medicine | Admitting: Internal Medicine

## 2023-04-27 ENCOUNTER — Other Ambulatory Visit: Payer: Self-pay | Admitting: Internal Medicine

## 2023-04-27 DIAGNOSIS — R3129 Other microscopic hematuria: Secondary | ICD-10-CM

## 2023-04-27 DIAGNOSIS — K5730 Diverticulosis of large intestine without perforation or abscess without bleeding: Secondary | ICD-10-CM | POA: Diagnosis not present

## 2023-04-27 DIAGNOSIS — N200 Calculus of kidney: Secondary | ICD-10-CM | POA: Diagnosis not present

## 2023-05-06 DIAGNOSIS — R3121 Asymptomatic microscopic hematuria: Secondary | ICD-10-CM | POA: Diagnosis not present

## 2023-05-06 DIAGNOSIS — N200 Calculus of kidney: Secondary | ICD-10-CM | POA: Diagnosis not present

## 2023-05-07 ENCOUNTER — Other Ambulatory Visit: Payer: Self-pay | Admitting: Internal Medicine

## 2023-05-07 DIAGNOSIS — E1140 Type 2 diabetes mellitus with diabetic neuropathy, unspecified: Secondary | ICD-10-CM

## 2023-05-20 DIAGNOSIS — N3289 Other specified disorders of bladder: Secondary | ICD-10-CM | POA: Diagnosis not present

## 2023-05-20 DIAGNOSIS — R3121 Asymptomatic microscopic hematuria: Secondary | ICD-10-CM | POA: Diagnosis not present

## 2023-05-20 DIAGNOSIS — N200 Calculus of kidney: Secondary | ICD-10-CM | POA: Diagnosis not present

## 2023-05-20 DIAGNOSIS — R3129 Other microscopic hematuria: Secondary | ICD-10-CM | POA: Diagnosis not present

## 2023-05-20 DIAGNOSIS — K5730 Diverticulosis of large intestine without perforation or abscess without bleeding: Secondary | ICD-10-CM | POA: Diagnosis not present

## 2023-05-27 ENCOUNTER — Ambulatory Visit (INDEPENDENT_AMBULATORY_CARE_PROVIDER_SITE_OTHER): Payer: PPO

## 2023-05-27 VITALS — Ht 61.0 in | Wt 140.0 lb

## 2023-05-27 DIAGNOSIS — Z0000 Encounter for general adult medical examination without abnormal findings: Secondary | ICD-10-CM | POA: Diagnosis not present

## 2023-05-27 NOTE — Progress Notes (Signed)
Subjective:   Julie Rivas is a 67 y.o. female who presents for Medicare Annual (Subsequent) preventive examination.  Visit Complete: Virtual  I connected with  Julie Rivas on 05/27/23 by a audio enabled telemedicine application and verified that I am speaking with the correct person using two identifiers.  Patient Location: Home  Provider Location: Home Office  I discussed the limitations of evaluation and management by telemedicine. The patient expressed understanding and agreed to proceed.  Vital Signs: Unable to obtain new vitals due to this being a telehealth visit.   Cardiac Risk Factors include: advanced age (>29men, >58 women);diabetes mellitus;hypertension     Objective:    Today's Vitals   05/27/23 0953  Weight: 140 lb (63.5 kg)  Height: 5\' 1"  (1.549 m)   Body mass index is 26.45 kg/m.     05/27/2023   10:00 AM 06/10/2022   11:02 AM  Advanced Directives  Does Patient Have a Medical Advance Directive? Yes No  Type of Estate agent of [place];Living will   Copy of Healthcare Power of Attorney in Chart? No - copy requested   Would patient like information on creating a medical advance directive?  No - Patient declined    Current Medications (verified) Outpatient Encounter Medications as of 05/27/2023  Medication Sig   albuterol (PROVENTIL) (2.5 MG/3ML) 0.083% nebulizer solution USE 1 VIAL IN NEBULIZER EVERY 6 HOURS AS NEEDED FOR WHEEZING FOR SHORTNESS OF BREATH   blood glucose meter kit and supplies Dispense based on patient and insurance preference. Use up to four times daily as directed. (FOR ICD-10 E10.9, E11.9).   Lancets (ONETOUCH DELICA PLUS LANCET33G) MISC Place 1 Act onto the skin 4 (four) times daily as needed.   montelukast (SINGULAIR) 10 MG tablet Take 1 tablet (10 mg total) by mouth at bedtime.   ONETOUCH ULTRA test strip USE AS DIRECTED UP TO  4 TIMES DAILY TO CHECK BLOOD SUGARS   potassium chloride (KLOR-CON) 10 MEQ  tablet Take 1 tablet (10 mEq total) by mouth 3 (three) times daily.   pregabalin (LYRICA) 25 MG capsule TAKE 1 CAPSULE BY MOUTH THREE TIMES DAILY   rosuvastatin (CRESTOR) 20 MG tablet Take 1 tablet (20 mg total) by mouth daily.   spironolactone (ALDACTONE) 50 MG tablet Take 1 tablet by mouth once daily   tiZANidine (ZANAFLEX) 4 MG tablet Take 1 tablet (4 mg total) by mouth every 8 (eight) hours as needed for muscle spasms.   No facility-administered encounter medications on file as of 05/27/2023.    Allergies (verified) Codeine and Metformin and related   History: Past Medical History:  Diagnosis Date   Allergy    Anxiety    Arthritis    Asthma    Cataract    REMOVED BOTH EYES    Hyperlipidemia    Hypertension    Past Surgical History:  Procedure Laterality Date   COLONOSCOPY     ECTOPIC PREGNANCY SURGERY     has partial right fallopian tube   POLYPECTOMY     Family History  Problem Relation Age of Onset   Heart failure Mother    Arthritis Mother    Cancer Father    Hypertension Father    Diabetes Sister    Colon cancer Neg Hx    Esophageal cancer Neg Hx    Stomach cancer Neg Hx    Rectal cancer Neg Hx    Hyperlipidemia Neg Hx    Heart disease Neg Hx    Early  death Neg Hx    Depression Neg Hx    Colon polyps Neg Hx    Social History   Socioeconomic History   Marital status: Married    Spouse name: Not on file   Number of children: Not on file   Years of education: Not on file   Highest education level: Bachelor's degree (e.g., BA, AB, BS)  Occupational History   Not on file  Tobacco Use   Smoking status: Never   Smokeless tobacco: Never  Vaping Use   Vaping status: Never Used  Substance and Sexual Activity   Alcohol use: No   Drug use: No   Sexual activity: Yes    Birth control/protection: Surgical  Other Topics Concern   Not on file  Social History Narrative   Not on file   Social Determinants of Health   Financial Resource Strain: Low Risk   (05/27/2023)   Overall Financial Resource Strain (CARDIA)    Difficulty of Paying Living Expenses: Not hard at all  Food Insecurity: No Food Insecurity (05/27/2023)   Hunger Vital Sign    Worried About Running Out of Food in the Last Year: Never true    Ran Out of Food in the Last Year: Never true  Recent Concern: Food Insecurity - Food Insecurity Present (03/16/2023)   Hunger Vital Sign    Worried About Running Out of Food in the Last Year: Sometimes true    Ran Out of Food in the Last Year: Never true  Transportation Needs: No Transportation Needs (05/27/2023)   PRAPARE - Administrator, Civil Service (Medical): No    Lack of Transportation (Non-Medical): No  Physical Activity: Inactive (05/27/2023)   Exercise Vital Sign    Days of Exercise per Week: 0 days    Minutes of Exercise per Session: 0 min  Stress: No Stress Concern Present (05/27/2023)   [employer] of Occupational Health - Occupational Stress Questionnaire    Feeling of Stress : Not at all  Social Connections: Socially Integrated (05/27/2023)   Social Connection and Isolation Panel [NHANES]    Frequency of Communication with Friends and Family: More than three times a week    Frequency of Social Gatherings with Friends and Family: More than three times a week    Attends Religious Services: More than 4 times per year    Active Member of [employer] or Organizations: Yes    Attends Engineer, structural: More than 4 times per year    Marital Status: Married    Tobacco Counseling Counseling given: Not Answered   Clinical Intake:  Pre-visit preparation completed: Yes  Pain : No/denies pain     BMI - recorded: 26.45 Nutritional Status: BMI 25 -29 Overweight Nutritional Risks: None Diabetes: Yes CBG done?: No Did pt. bring in CBG monitor from home?: No  How often do you need to have someone help you when you read instructions, pamphlets, or other written materials from your doctor or pharmacy?: 1 -  Never  Interpreter Needed?: No  Information entered by :: Theresa Mulligan LPN   Activities of Daily Living    05/27/2023    9:59 AM 06/10/2022   11:01 AM  In your present state of health, do you have any difficulty performing the following activities:  Hearing? 0 0  Vision? 0 0  Difficulty concentrating or making decisions? 0 0  Walking or climbing stairs? 0 0  Dressing or bathing? 0 0  Doing errands, shopping? 0 0  Preparing  Food and eating ? N N  Using the Toilet? N N  In the past six months, have you accidently leaked urine? N N  Do you have problems with loss of bowel control? N N  Managing your Medications? N N  Managing your Finances? N N  Housekeeping or managing your Housekeeping? N N    Patient Care Team: Etta Grandchild, MD as PCP - General (Internal Medicine)  Indicate any recent Medical Services you may have received from other than [facility] providers in the past year (date may be approximate).     Assessment:   This is a routine wellness examination for Julie Rivas.  Hearing/Vision screen Hearing Screening - Comments:: Denies hearing difficulties   Vision Screening - Comments:: Wears rx glasses - up to date with routine eye exams   Deferred   Goals Addressed               This Visit's Progress     No Goals (pt-stated)         Depression Screen    05/27/2023    9:59 AM 03/01/2023    2:52 PM 10/21/2022    1:02 PM 06/10/2022   10:56 AM 12/25/2019    1:16 PM 09/21/2018   11:43 AM  PHQ 2/9 Scores  PHQ - 2 Score 0 0 0 0 0 0    Fall Risk    05/27/2023   10:00 AM 03/01/2023    2:52 PM 10/21/2022    1:01 PM 05/21/2022    9:36 AM 04/21/2021   10:29 AM  Fall Risk   Falls in the past year? 0 0 0 0 0  Number falls in past yr: 0 0 0 0   Injury with Fall? 0 0 0 0   Risk for fall due to : No Fall Risks No Fall Risks No Fall Risks No Fall Risks   Follow up Falls prevention discussed Falls evaluation completed Falls evaluation completed Falls evaluation completed      MEDICARE RISK AT HOME: Medicare Risk at Home Any stairs in or around the home?: No If so, are there any without handrails?: No Home free of loose throw rugs in walkways, pet beds, electrical cords, etc?: Yes Adequate lighting in your home to reduce risk of falls?: Yes Life alert?: No Use of a cane, walker or w/c?: No Grab bars in the bathroom?: Yes Shower chair or bench in shower?: Yes Elevated toilet seat or a handicapped toilet?: No  TIMED UP AND GO:  Was the test performed?  No    Cognitive Function:        05/27/2023   10:00 AM 06/10/2022   11:02 AM  6CIT Screen  What Year? 0 points 0 points  What month? 0 points 0 points  What time? 0 points 0 points  Count back from 20 0 points 0 points  Months in reverse 0 points 0 points  Repeat phrase 0 points 0 points  Total Score 0 points 0 points    Immunizations Immunization History  Administered Date(s) Administered   Fluad Quad(high Dose 65+) 07/10/2020, 07/16/2021, 05/21/2022   Influenza,inj,Quad PF,6+ Mos 07/01/2016, 07/04/2018   PFIZER(Purple Top)SARS-COV-2 Vaccination 10/29/2019, 11/22/2019   Pneumococcal Conjugate-13 02/06/2020   Pneumococcal Polysaccharide-23 10/30/2015, 12/24/2021   Tdap 02/06/2020   Zoster, Live 08/07/2014    TDAP status: Up to date    Pneumococcal vaccine status: Up to date     Screening Tests Health Maintenance  Topic Date Due   FOOT EXAM  12/25/2022  Zoster Vaccines- Shingrix (1 of 2) 07/23/2023 (Originally 02/07/2004)   INFLUENZA VACCINE  12/06/2023 (Originally 04/08/2023)   HEMOGLOBIN A1C  10/23/2023   OPHTHALMOLOGY EXAM  03/23/2024   Diabetic kidney evaluation - eGFR measurement  04/21/2024   Diabetic kidney evaluation - Urine ACR  04/21/2024   Medicare Annual Wellness (AWV)  05/26/2024   MAMMOGRAM  09/03/2024   Colonoscopy  01/18/2028   DTaP/Tdap/Td (2 - Td or Tdap) 02/05/2030   Pneumonia Vaccine 51+ Years old  Completed   DEXA SCAN  Completed   Hepatitis C Screening   Completed   HPV VACCINES  Aged Out   COVID-19 Vaccine  Discontinued    Health Maintenance  Health Maintenance Due  Topic Date Due   FOOT EXAM  12/25/2022    Colorectal cancer screening: Type of screening: Colonoscopy. Completed 01/17/21. Repeat every 7 years  Mammogram status: Completed 09/03/22. Repeat every year  Bone Density status: Completed 08/06/22. Results reflect: Bone density results: OSTEOPENIA. Repeat every   years.   Additional Screening:  Hepatitis C Screening: does qualify; Completed 10/30/15  Vision Screening: Recommended annual ophthalmology exams for early detection of glaucoma and other disorders of the eye. Is the patient up to date with their annual eye exam?  Yes  Who is the provider or what is the name of the office in which the patient attends annual eye exams? Deferred If pt is not established with a provider, would they like to be referred to a provider to establish care? No .   Dental Screening: Recommended annual dental exams for proper oral hygiene  Diabetic Foot Exam: Diabetic Foot Exam: Overdue, Pt has been advised about the importance in completing this exam. Pt is scheduled for diabetic foot exam on Followed by PCP.  Community Resource Referral / Chronic Care Management:  CRR required this visit?  No   CCM required this visit?  No     Plan:     I have personally reviewed and noted the following in the patient's chart:   Medical and social history Use of alcohol, tobacco or illicit drugs  Current medications and supplements including opioid prescriptions. Patient is not currently taking opioid prescriptions. Functional ability and status Nutritional status Physical activity Advanced directives List of other physicians Hospitalizations, surgeries, and ER visits in previous 12 months Vitals Screenings to include cognitive, depression, and falls Referrals and appointments  In addition, I have reviewed and discussed with patient  certain preventive protocols, quality metrics, and best practice recommendations. A written personalized care plan for preventive services as well as general preventive health recommendations were provided to patient.     Tillie Rung, LPN   12/14/8117   After Visit Summary: (MyChart) Due to this being a telephonic visit, the after visit summary with patients personalized plan was offered to patient via MyChart   Nurse Notes: None

## 2023-05-27 NOTE — Patient Instructions (Addendum)
Ms. Breidinger , Thank you for taking time to come for your Medicare Wellness Visit. I appreciate your ongoing commitment to your health goals. Please review the following plan we discussed and let me know if I can assist you in the future.   Referrals/Orders/Follow-Ups/Clinician Recommendations:   This is a list of the screening recommended for you and due dates:  Health Maintenance  Topic Date Due   Complete foot exam   12/25/2022   Zoster (Shingles) Vaccine (1 of 2) 07/23/2023*   Flu Shot  12/06/2023*   Hemoglobin A1C  10/23/2023   Eye exam for diabetics  03/23/2024   Yearly kidney function blood test for diabetes  04/21/2024   Yearly kidney health urinalysis for diabetes  04/21/2024   Medicare Annual Wellness Visit  05/26/2024   Mammogram  09/03/2024   Colon Cancer Screening  01/18/2028   DTaP/Tdap/Td vaccine (2 - Td or Tdap) 02/05/2030   Pneumonia Vaccine  Completed   DEXA scan (bone density measurement)  Completed   Hepatitis C Screening  Completed   HPV Vaccine  Aged Out   COVID-19 Vaccine  Discontinued  *Topic was postponed. The date shown is not the original due date.    Advanced directives: (Copy Requested) Please bring a copy of your health care power of attorney and living will to the office to be added to your chart at your convenience.  Next Medicare Annual Wellness Visit scheduled for next year: Yes

## 2023-06-16 DIAGNOSIS — D414 Neoplasm of uncertain behavior of bladder: Secondary | ICD-10-CM | POA: Diagnosis not present

## 2023-06-16 DIAGNOSIS — N200 Calculus of kidney: Secondary | ICD-10-CM | POA: Diagnosis not present

## 2023-06-16 DIAGNOSIS — R3121 Asymptomatic microscopic hematuria: Secondary | ICD-10-CM | POA: Diagnosis not present

## 2023-07-19 ENCOUNTER — Other Ambulatory Visit: Payer: Self-pay | Admitting: Internal Medicine

## 2023-07-19 DIAGNOSIS — F411 Generalized anxiety disorder: Secondary | ICD-10-CM

## 2023-07-19 DIAGNOSIS — M5431 Sciatica, right side: Secondary | ICD-10-CM

## 2023-07-20 ENCOUNTER — Other Ambulatory Visit: Payer: Self-pay | Admitting: Internal Medicine

## 2023-07-20 DIAGNOSIS — I10 Essential (primary) hypertension: Secondary | ICD-10-CM

## 2023-07-20 DIAGNOSIS — E876 Hypokalemia: Secondary | ICD-10-CM

## 2023-07-21 ENCOUNTER — Other Ambulatory Visit: Payer: Self-pay | Admitting: Ophthalmology

## 2023-07-21 DIAGNOSIS — D414 Neoplasm of uncertain behavior of bladder: Secondary | ICD-10-CM | POA: Diagnosis not present

## 2023-07-22 LAB — SURGICAL PATHOLOGY

## 2023-07-28 DIAGNOSIS — R3915 Urgency of urination: Secondary | ICD-10-CM | POA: Diagnosis not present

## 2023-07-28 DIAGNOSIS — R350 Frequency of micturition: Secondary | ICD-10-CM | POA: Diagnosis not present

## 2023-07-28 DIAGNOSIS — M5416 Radiculopathy, lumbar region: Secondary | ICD-10-CM | POA: Diagnosis not present

## 2023-07-28 DIAGNOSIS — R3121 Asymptomatic microscopic hematuria: Secondary | ICD-10-CM | POA: Diagnosis not present

## 2023-07-29 ENCOUNTER — Encounter: Payer: Self-pay | Admitting: Internal Medicine

## 2023-08-25 ENCOUNTER — Other Ambulatory Visit: Payer: Self-pay | Admitting: Internal Medicine

## 2023-08-25 DIAGNOSIS — J4531 Mild persistent asthma with (acute) exacerbation: Secondary | ICD-10-CM

## 2023-08-25 DIAGNOSIS — J4540 Moderate persistent asthma, uncomplicated: Secondary | ICD-10-CM

## 2023-08-27 ENCOUNTER — Ambulatory Visit (INDEPENDENT_AMBULATORY_CARE_PROVIDER_SITE_OTHER): Payer: PPO | Admitting: Internal Medicine

## 2023-08-27 VITALS — BP 118/68 | HR 84 | Temp 98.4°F | Ht 61.0 in | Wt 144.0 lb

## 2023-08-27 DIAGNOSIS — J4541 Moderate persistent asthma with (acute) exacerbation: Secondary | ICD-10-CM | POA: Diagnosis not present

## 2023-08-27 MED ORDER — PREDNISONE 20 MG PO TABS: 40.0000 mg | ORAL_TABLET | Freq: Every day | ORAL | 0 refills | Status: DC

## 2023-08-27 MED ORDER — PROMETHAZINE-DM 6.25-15 MG/5ML PO SYRP: 5.0000 mL | ORAL_SOLUTION | Freq: Four times a day (QID) | ORAL | 0 refills | Status: DC | PRN

## 2023-08-27 NOTE — Patient Instructions (Signed)
We have sent in prednisone to take 2 pills daily for 1 week.  We have sent in cough medicine to help which you can use up to 4 times a day.

## 2023-08-27 NOTE — Progress Notes (Signed)
   Subjective:   Patient ID: Julie Rivas, female    DOB: 07/03/1956, 67 y.o.   MRN: [id number]  HPI The patient is a 67 YO female coming in for sick visit. Has asthma exposed to sick contact last week. Started getting sick Monday. Denies fevers has mild chills. Cough and congestion. Some SOB and did nebulizer this week did not help much.  Review of Systems  Constitutional:  Positive for activity change, appetite change and chills. Negative for fatigue, fever and unexpected weight change.  HENT:  Positive for congestion, postnasal drip, rhinorrhea and sinus pressure. Negative for ear discharge, ear pain, sinus pain, sneezing, sore throat, tinnitus, trouble swallowing and voice change.   Eyes: Negative.   Respiratory:  Positive for cough, shortness of breath and wheezing. Negative for chest tightness.   Cardiovascular: Negative.   Gastrointestinal: Negative.   Musculoskeletal:  Positive for myalgias.  Neurological: Negative.     Objective:  Physical Exam Constitutional:      Appearance: She is well-developed.  HENT:     Head: Normocephalic and atraumatic.     Comments: Oropharynx with redness and clear drainage, nose with swollen turbinates, TMs normal bilaterally.  Neck:     Thyroid: No thyromegaly.  Cardiovascular:     Rate and Rhythm: Normal rate and regular rhythm.  Pulmonary:     Effort: Pulmonary effort is normal. No respiratory distress.     Breath sounds: Wheezing present. No rales.  Abdominal:     Palpations: Abdomen is soft.  Musculoskeletal:        General: Tenderness present.     Cervical back: Normal range of motion.  Lymphadenopathy:     Cervical: No cervical adenopathy.  Skin:    General: Skin is warm and dry.  Neurological:     Mental Status: She is alert and oriented to person, place, and time.     Vitals:   08/27/23 1041  BP: 118/68  Pulse: 84  Temp: 98.4 F (36.9 C)  TempSrc: Oral  SpO2: 98%  Weight: 144 lb (65.3 kg)  Height: 5\' 1"  (1.549 m)     Assessment & Plan:

## 2023-08-27 NOTE — Assessment & Plan Note (Signed)
With early flare likely viral induced. Rx prednisone 1 week course and cough medicine promethazine/dm (allergy to codeine). If no improvement let us know. Lungs without rhonchi on exam mild wheezing. Use albuterol nebulizer prn for SOB.

## 2023-09-04 ENCOUNTER — Other Ambulatory Visit: Payer: Self-pay | Admitting: Internal Medicine

## 2023-09-04 DIAGNOSIS — F411 Generalized anxiety disorder: Secondary | ICD-10-CM

## 2023-09-10 ENCOUNTER — Ambulatory Visit: Payer: Self-pay | Admitting: Internal Medicine

## 2023-09-10 NOTE — Telephone Encounter (Signed)
 Chief Complaint: shortness of breath Symptoms: cough, mild sob, pain in lower back, chills Frequency: since november Pertinent Negatives: Patient denies fever Disposition: [] ED /[x] Urgent Care (no appt availability in office) / [] Appointment(In office/virtual)/ []  [facility]/ [] Home Care/ [x] Refused Recommended Disposition /[] [facility]/ []  Follow-up with PCP Additional Notes: Patient reports she has been having episodes of mild sob, cough, chills, and pain in lower back while coughing since November. Patient reports she was seen in office at the end of December and prescribed an antibiotic and cough medicine that did not help. Patient reports she is still experiencing these symptoms. Per protocol, this RN attempted to schedule in office visit, no availability today. This RN offered UC and patient refused. Patient requesting to be scheduled in office next week. Appt scheduled 1/6 at patient request, but patient advised that it is strongly recommended that she be seen at [facility] or the ED sooner for her unresolved symptoms. Patient verbalized understanding and reports she would rather wait to be seen in office next week. Patient agreeable to go to [facility] or ED if symptoms worsen over the weekend.    Copied from CRM [id number]. Topic: Clinical - Red Word Triage >> Sep 10, 2023 11:02 AM Leotis ORN wrote: [facility] that prompted transfer to Nurse Triage: chest pain, cough, difficulty breathing, cough is producing chest and stomach pain. Ongoing for almost a month, was previously seen and given medication, treatment has not worked Reason for Disposition  [1] MILD difficulty breathing (e.g., minimal/no SOB at rest, SOB with walking, pulse <100) AND [2] NEW-onset or WORSE than normal  Answer Assessment - Initial Assessment Questions 1. RESPIRATORY STATUS: Describe your breathing? (e.g., wheezing, shortness of breath, unable to speak, severe coughing)      Shortness of breath 2. ONSET: When  did this breathing problem begin?      Since November, getting worse 3. PATTERN Does the difficult breathing come and go, or has it been constant since it started?      constant 4. SEVERITY: How bad is your breathing? (e.g., mild, moderate, severe)    - MILD: No SOB at rest, mild SOB with walking, speaks normally in sentences, can lie down, no retractions, pulse < 100.    - MODERATE: SOB at rest, SOB with minimal exertion and prefers to sit, cannot lie down flat, speaks in phrases, mild retractions, audible wheezing, pulse 100-120.    - SEVERE: Very SOB at rest, speaks in single words, struggling to breathe, sitting hunched forward, retractions, pulse > 120      mild 5. RECURRENT SYMPTOM: Have you had difficulty breathing before? If Yes, ask: When was the last time? and What happened that time?      none 6. CARDIAC HISTORY: Do you have any history of heart disease? (e.g., heart attack, angina, bypass surgery, angioplasty)      none 7. LUNG HISTORY: Do you have any history of lung disease?  (e.g., pulmonary embolus, asthma, emphysema)     asthma 8. CAUSE: What do you think is causing the breathing problem?      I was prescribed antibiotics and cold medicine and it didn't help 9. OTHER SYMPTOMS: Do you have any other symptoms? (e.g., dizziness, runny nose, cough, chest pain, fever)     Lower back pain, cough, chills 10. O2 SATURATION MONITOR:  Do you use an oxygen saturation monitor (pulse oximeter) at home? If Yes, ask: What is your reading (oxygen level) today? What is your usual oxygen  saturation reading? (e.g., 95%)       unsure  Protocols used: Breathing Difficulty-A-AH

## 2023-09-13 ENCOUNTER — Encounter: Payer: Self-pay | Admitting: *Deleted

## 2023-09-13 ENCOUNTER — Telehealth (INDEPENDENT_AMBULATORY_CARE_PROVIDER_SITE_OTHER): Payer: PPO | Admitting: Family Medicine

## 2023-09-13 ENCOUNTER — Encounter: Payer: Self-pay | Admitting: Family Medicine

## 2023-09-13 DIAGNOSIS — J019 Acute sinusitis, unspecified: Secondary | ICD-10-CM | POA: Diagnosis not present

## 2023-09-13 MED ORDER — AMOXICILLIN-POT CLAVULANATE 875-125 MG PO TABS: 1.0000 | ORAL_TABLET | Freq: Two times a day (BID) | ORAL | 0 refills | Status: AC

## 2023-09-13 NOTE — Progress Notes (Signed)
 Virtual Visit via Video Note  I connected with Julie Rivas on 09/13/23 at 10:00 AM EST by a video enabled telemedicine application and verified that I am speaking with the correct person using two identifiers.  Patient Location: Home Provider Location: Home Office  I discussed the limitations, risks, security, and privacy concerns of performing an evaluation and management service by video and the availability of in person appointments. I also discussed with the patient that there may be a patient responsible charge related to this service. The patient expressed understanding and agreed to proceed.  Subjective: PCP: Joshua Debby CROME, MD  Chief Complaint  Patient presents with   Cough    Cough, facial pressure, HA ears hurt, teeth hurt, sneezing = this started around Thanksgiving.  Took prednisone , mucinex and RX cough syrup (OV 08/27/23)    Patient complains of cough, headache, sneezing, sore throat, ear pain/pressure, and facial pain/pressure. She denies wheezing. Onset of symptoms was 1 month ago, gradually worsening since that time. She is drinking plenty of fluids. Evaluation to date: seen previously and diagnosed with asthma exacerbation. Treatment to date:  Prednisone , prescription cough syrup, Mucinex . She has a history of asthma. She does not smoke.     ROS: Per HPI  Current Outpatient Medications:    albuterol  (PROVENTIL ) (2.5 MG/3ML) 0.083% nebulizer solution, USE 1 VIAL IN NEBULIZER EVERY 6 HOURS AS NEEDED FOR WHEEZING OR SHORTNESS OF BREATH, Disp: 150 mL, Rfl: 0   blood glucose meter kit and supplies, Dispense based on patient and insurance preference. Use up to four times daily as directed. (FOR ICD-10 E10.9, E11.9)., Disp: 1 each, Rfl: 0   Lancets (ONETOUCH DELICA PLUS LANCET33G) MISC, Place 1 Act onto the skin 4 (four) times daily as needed., Disp: 100 each, Rfl: 5   montelukast  (SINGULAIR ) 10 MG tablet, Take 1 tablet (10 mg total) by mouth at bedtime., Disp: 90  tablet, Rfl: 1   ONETOUCH ULTRA test strip, USE AS DIRECTED UP TO  4 TIMES DAILY TO CHECK BLOOD SUGARS, Disp: 100 each, Rfl: 5   potassium chloride  (KLOR-CON ) 10 MEQ tablet, TAKE 1 TABLET BY MOUTH THREE TIMES DAILY, Disp: 270 tablet, Rfl: 0   pregabalin  (LYRICA ) 25 MG capsule, TAKE 1 CAPSULE BY MOUTH THREE TIMES DAILY, Disp: 270 capsule, Rfl: 0   promethazine -dextromethorphan (PROMETHAZINE -DM) 6.25-15 MG/5ML syrup, Take 5 mLs by mouth 4 (four) times daily as needed., Disp: 118 mL, Rfl: 0   rosuvastatin  (CRESTOR ) 20 MG tablet, Take 1 tablet (20 mg total) by mouth daily., Disp: 90 tablet, Rfl: 1   spironolactone  (ALDACTONE ) 50 MG tablet, Take 1 tablet by mouth once daily, Disp: 90 tablet, Rfl: 0   tiZANidine  (ZANAFLEX ) 4 MG tablet, Take 1 tablet (4 mg total) by mouth every 8 (eight) hours as needed for muscle spasms., Disp: 90 tablet, Rfl: 2  Observations/Objective: There were no vitals filed for this visit. Physical Exam Constitutional:      General: She is not in acute distress.    Appearance: Normal appearance. She is not ill-appearing or toxic-appearing.  Eyes:     General: No scleral icterus.       Right eye: No discharge.        Left eye: No discharge.     Conjunctiva/sclera: Conjunctivae normal.  Pulmonary:     Effort: Pulmonary effort is normal. No respiratory distress.  Neurological:     Mental Status: She is alert and oriented to person, place, and time.  Psychiatric:  Mood and Affect: Mood normal.        Behavior: Behavior normal.        Thought Content: Thought content normal.        Judgment: Judgment normal.     Assessment and Plan: 1. Acute non-recurrent sinusitis, unspecified location (Primary) Education provided on sinus infections.  Encouraged symptom management including throat lozenges, chloraseptic spray, warm salt water gargles, hot tea/honey, cough syrup (Delsym), Tylenol/Ibuprofen, Vicks, and a humidifier at night.  - amoxicillin -clavulanate (AUGMENTIN )  875-125 MG tablet; Take 1 tablet by mouth 2 (two) times daily for 7 days.  Dispense: 14 tablet; Refill: 0   Follow Up Instructions: Return if symptoms worsen or fail to improve.   I discussed the assessment and treatment plan with the patient. The patient was provided an opportunity to ask questions, and all were answered. The patient agreed with the plan and demonstrated an understanding of the instructions.   The patient was advised to call back or seek an in-person evaluation if the symptoms worsen or if the condition fails to improve as anticipated.  The above assessment and management plan was discussed with the patient. The patient verbalized understanding of and has agreed to the management plan.   Niki JULIANNA Rung, FNP

## 2023-09-13 NOTE — Patient Instructions (Signed)
Throat lozenges, chloraseptic spray, warm salt water gargles, hot tea/honey, cough syrup (Delsym), Tylenol, Vicks, and a humidifier at night.

## 2023-09-21 ENCOUNTER — Other Ambulatory Visit: Payer: Self-pay | Admitting: Internal Medicine

## 2023-09-21 DIAGNOSIS — J4540 Moderate persistent asthma, uncomplicated: Secondary | ICD-10-CM

## 2023-09-25 ENCOUNTER — Other Ambulatory Visit: Payer: Self-pay | Admitting: Internal Medicine

## 2023-09-25 DIAGNOSIS — F411 Generalized anxiety disorder: Secondary | ICD-10-CM

## 2023-10-20 ENCOUNTER — Other Ambulatory Visit: Payer: Self-pay | Admitting: Internal Medicine

## 2023-10-20 DIAGNOSIS — E876 Hypokalemia: Secondary | ICD-10-CM

## 2023-10-20 DIAGNOSIS — I10 Essential (primary) hypertension: Secondary | ICD-10-CM

## 2023-11-01 ENCOUNTER — Ambulatory Visit (INDEPENDENT_AMBULATORY_CARE_PROVIDER_SITE_OTHER): Payer: PPO | Admitting: Internal Medicine

## 2023-11-01 ENCOUNTER — Ambulatory Visit (INDEPENDENT_AMBULATORY_CARE_PROVIDER_SITE_OTHER): Payer: PPO

## 2023-11-01 ENCOUNTER — Encounter: Payer: Self-pay | Admitting: Internal Medicine

## 2023-11-01 VITALS — BP 156/80 | HR 104 | Temp 97.6°F | Resp 16 | Ht 61.0 in | Wt 147.2 lb

## 2023-11-01 DIAGNOSIS — F411 Generalized anxiety disorder: Secondary | ICD-10-CM | POA: Diagnosis not present

## 2023-11-01 DIAGNOSIS — E876 Hypokalemia: Secondary | ICD-10-CM | POA: Diagnosis not present

## 2023-11-01 DIAGNOSIS — R7303 Prediabetes: Secondary | ICD-10-CM

## 2023-11-01 DIAGNOSIS — R000 Tachycardia, unspecified: Secondary | ICD-10-CM | POA: Diagnosis not present

## 2023-11-01 DIAGNOSIS — J4541 Moderate persistent asthma with (acute) exacerbation: Secondary | ICD-10-CM

## 2023-11-01 DIAGNOSIS — Z79899 Other long term (current) drug therapy: Secondary | ICD-10-CM | POA: Insufficient documentation

## 2023-11-01 DIAGNOSIS — R7989 Other specified abnormal findings of blood chemistry: Secondary | ICD-10-CM | POA: Insufficient documentation

## 2023-11-01 DIAGNOSIS — E538 Deficiency of other specified B group vitamins: Secondary | ICD-10-CM | POA: Diagnosis not present

## 2023-11-01 DIAGNOSIS — N1832 Chronic kidney disease, stage 3b: Secondary | ICD-10-CM | POA: Diagnosis not present

## 2023-11-01 DIAGNOSIS — E785 Hyperlipidemia, unspecified: Secondary | ICD-10-CM

## 2023-11-01 DIAGNOSIS — G63 Polyneuropathy in diseases classified elsewhere: Secondary | ICD-10-CM

## 2023-11-01 DIAGNOSIS — I10 Essential (primary) hypertension: Secondary | ICD-10-CM | POA: Diagnosis not present

## 2023-11-01 DIAGNOSIS — R058 Other specified cough: Secondary | ICD-10-CM

## 2023-11-01 DIAGNOSIS — R059 Cough, unspecified: Secondary | ICD-10-CM | POA: Diagnosis not present

## 2023-11-01 DIAGNOSIS — R9431 Abnormal electrocardiogram [ECG] [EKG]: Secondary | ICD-10-CM | POA: Diagnosis not present

## 2023-11-01 DIAGNOSIS — R0602 Shortness of breath: Secondary | ICD-10-CM | POA: Diagnosis not present

## 2023-11-01 DIAGNOSIS — Z0000 Encounter for general adult medical examination without abnormal findings: Secondary | ICD-10-CM

## 2023-11-01 DIAGNOSIS — Z1231 Encounter for screening mammogram for malignant neoplasm of breast: Secondary | ICD-10-CM | POA: Insufficient documentation

## 2023-11-01 DIAGNOSIS — Z0001 Encounter for general adult medical examination with abnormal findings: Secondary | ICD-10-CM

## 2023-11-01 LAB — CBC WITH DIFFERENTIAL/PLATELET
Basophils Absolute: 0 10*3/uL (ref 0.0–0.1)
Basophils Relative: 0.4 % (ref 0.0–3.0)
Eosinophils Absolute: 0.1 10*3/uL (ref 0.0–0.7)
Eosinophils Relative: 1.3 % (ref 0.0–5.0)
HCT: 42.2 % (ref 36.0–46.0)
Hemoglobin: 14.4 g/dL (ref 12.0–15.0)
Lymphocytes Relative: 39.9 % (ref 12.0–46.0)
Lymphs Abs: 2.7 10*3/uL (ref 0.7–4.0)
MCHC: 34.1 g/dL (ref 30.0–36.0)
MCV: 93.3 fL (ref 78.0–100.0)
Monocytes Absolute: 0.5 10*3/uL (ref 0.1–1.0)
Monocytes Relative: 7.1 % (ref 3.0–12.0)
Neutro Abs: 3.5 10*3/uL (ref 1.4–7.7)
Neutrophils Relative %: 51.3 % (ref 43.0–77.0)
Platelets: 283 10*3/uL (ref 150.0–400.0)
RBC: 4.52 Mil/uL (ref 3.87–5.11)
RDW: 13.2 % (ref 11.5–15.5)
WBC: 6.7 10*3/uL (ref 4.0–10.5)

## 2023-11-01 LAB — URINALYSIS, ROUTINE W REFLEX MICROSCOPIC
Bilirubin Urine: NEGATIVE
Ketones, ur: NEGATIVE
Leukocytes,Ua: NEGATIVE
Nitrite: NEGATIVE
Specific Gravity, Urine: 1.02 (ref 1.000–1.030)
Total Protein, Urine: NEGATIVE
Urine Glucose: NEGATIVE
Urobilinogen, UA: 0.2 (ref 0.0–1.0)
pH: 6 (ref 5.0–8.0)

## 2023-11-01 LAB — BRAIN NATRIURETIC PEPTIDE: Pro B Natriuretic peptide (BNP): 13 pg/mL (ref 0.0–100.0)

## 2023-11-01 LAB — LIPID PANEL
Cholesterol: 279 mg/dL — ABNORMAL HIGH (ref 0–200)
HDL: 49.4 mg/dL (ref >39.00)
LDL Cholesterol: 201 mg/dL — ABNORMAL HIGH (ref 0–99)
NonHDL: 229.38
Total CHOL/HDL Ratio: 6
Triglycerides: 141 mg/dL (ref 0.0–149.0)
VLDL: 28.2 mg/dL (ref 0.0–40.0)

## 2023-11-01 LAB — HEPATIC FUNCTION PANEL
ALT: 19 U/L (ref 0–35)
AST: 17 U/L (ref 0–37)
Albumin: 4.4 g/dL (ref 3.5–5.2)
Alkaline Phosphatase: 90 U/L (ref 39–117)
Bilirubin, Direct: 0 mg/dL (ref 0.0–0.3)
Total Bilirubin: 0.5 mg/dL (ref 0.2–1.2)
Total Protein: 7.6 g/dL (ref 6.0–8.3)

## 2023-11-01 LAB — MAGNESIUM: Magnesium: 1.9 mg/dL (ref 1.5–2.5)

## 2023-11-01 LAB — D-DIMER, QUANTITATIVE: D-Dimer, Quant: 0.98 ug{FEU}/mL — ABNORMAL HIGH (ref <0.50)

## 2023-11-01 LAB — BASIC METABOLIC PANEL
BUN: 12 mg/dL (ref 6–23)
CO2: 30 meq/L (ref 19–32)
Calcium: 10 mg/dL (ref 8.4–10.5)
Chloride: 100 meq/L (ref 96–112)
Creatinine, Ser: 0.68 mg/dL (ref 0.40–1.20)
GFR: 88.67 mL/min (ref >60.00)
Glucose, Bld: 98 mg/dL (ref 70–99)
Potassium: 3.3 meq/L — ABNORMAL LOW (ref 3.5–5.1)
Sodium: 144 meq/L (ref 135–145)

## 2023-11-01 LAB — VITAMIN B12: Vitamin B-12: 764 pg/mL (ref 211–911)

## 2023-11-01 LAB — TSH: TSH: 0.87 u[IU]/mL (ref 0.35–5.50)

## 2023-11-01 LAB — HEMOGLOBIN A1C: Hgb A1c MFr Bld: 6.4 % (ref 4.6–6.5)

## 2023-11-01 LAB — TROPONIN I (HIGH SENSITIVITY): High Sens Troponin I: 8 ng/L (ref 2–17)

## 2023-11-01 LAB — FOLATE: Folate: 16.7 ng/mL (ref >5.9)

## 2023-11-01 MED ORDER — BUDESONIDE 0.5 MG/2ML IN SUSP
0.5000 mg | Freq: Two times a day (BID) | RESPIRATORY_TRACT | 3 refills | Status: DC
Start: 2023-11-01 — End: 2024-01-12

## 2023-11-01 MED ORDER — NEBIVOLOL HCL 5 MG PO TABS: 5.0000 mg | ORAL_TABLET | Freq: Every day | ORAL | 0 refills | Status: DC

## 2023-11-01 MED ORDER — ROSUVASTATIN CALCIUM 20 MG PO TABS: 20.0000 mg | ORAL_TABLET | Freq: Every day | ORAL | 0 refills | Status: DC

## 2023-11-01 NOTE — Progress Notes (Unsigned)
 Subjective:  Patient ID: Julie Rivas, female    DOB: 02/04/1956  Age: 68 y.o. MRN: [id number]  CC: Annual Exam, Hypertension, Hyperlipidemia, and Asthma   HPI Julie Rivas presents for a CPX and f/up -----  Discussed the use of AI scribe software for clinical note transcription with the patient, who gave verbal consent to proceed.  History of Present Illness   Julie Rivas is a 68 year old female with asthma who presents with a persistent cough and shortness of breath.  She has been experiencing a persistent cough since November, which is particularly bothersome at night. The cough is mostly dry but occasionally produces a small amount of phlegm. No fever, chills, or night sweats are present, but she has difficulty sleeping, often waking between 2 and 3 AM. She experiences intermittent shortness of breath and denies chest pain or hemoptysis. Her history of asthma includes frequent wheezing during this period. Financial constraints have prevented her from using her inhaler this month, though she has used an albuterol inhaler and a nebulizer in the past. Currently, she is taking over-the-counter Vicks cough medicine at night and a prescribed cough medicine.  She noticed her heart rate was elevated at 104 bpm upon arrival, which she observed while walking in. She is currently taking Singulair and spironolactone for blood pressure management. Additionally, she uses Tylenol or ibuprofen for pain management and pregabalin for neuropathy in her hands and feet.  She experiences burning and tingling sensations from her ankle to her knee, which she attributes to neuropathy. She is not receiving B12 injections and wonders if her B12 levels might be low.  She mentions being under significant stress due to her husband's stage five kidney failure and personal issues, which she believes may be affecting her health.       Outpatient Medications Prior to Visit  Medication Sig Dispense Refill    albuterol (PROVENTIL) (2.5 MG/3ML) 0.083% nebulizer solution USE 1 VIAL IN NEBULIZER EVERY 6 HOURS AS NEEDED FOR WHEEZING OR SHORTNESS OF BREATH 150 mL 0   blood glucose meter kit and supplies Dispense based on patient and insurance preference. Use up to four times daily as directed. (FOR ICD-10 E10.9, E11.9). 1 each 0   Lancets (ONETOUCH DELICA PLUS LANCET33G) MISC Place 1 Act onto the skin 4 (four) times daily as needed. 100 each 5   montelukast (SINGULAIR) 10 MG tablet TAKE 1 TABLET BY MOUTH AT BEDTIME 90 tablet 0   ONETOUCH ULTRA test strip USE AS DIRECTED UP TO  4 TIMES DAILY TO CHECK BLOOD SUGARS 100 each 5   potassium chloride (KLOR-CON) 10 MEQ tablet TAKE 1 TABLET BY MOUTH THREE TIMES DAILY (Patient taking differently: Take 10 mEq by mouth 2 (two) times daily.) 270 tablet 0   pregabalin (LYRICA) 25 MG capsule TAKE 1 CAPSULE BY MOUTH THREE TIMES DAILY 270 capsule 0   spironolactone (ALDACTONE) 50 MG tablet Take 1 tablet by mouth once daily 30 tablet 0   tiZANidine (ZANAFLEX) 4 MG tablet Take 1 tablet (4 mg total) by mouth every 8 (eight) hours as needed for muscle spasms. 90 tablet 2   promethazine-dextromethorphan (PROMETHAZINE-DM) 6.25-15 MG/5ML syrup Take 5 mLs by mouth 4 (four) times daily as needed. 118 mL 0   rosuvastatin (CRESTOR) 20 MG tablet Take 1 tablet (20 mg total) by mouth daily. 90 tablet 1   No facility-administered medications prior to visit.    ROS Review of Systems  Constitutional:  Negative for appetite change, chills,  diaphoresis, fatigue and fever.  HENT: Negative.  Negative for sore throat and trouble swallowing.   Eyes: Negative.   Respiratory:  Positive for cough, shortness of breath and wheezing. Negative for choking, chest tightness and stridor.   Cardiovascular:  Negative for chest pain, palpitations and leg swelling.  Gastrointestinal:  Negative for abdominal pain, diarrhea, nausea and vomiting.  Genitourinary: Negative.  Negative for difficulty urinating.   Musculoskeletal: Negative.  Negative for arthralgias and myalgias.  Skin: Negative.  Negative for color change and rash.  Neurological:  Negative for dizziness, weakness, light-headedness and headaches.  Hematological:  Negative for adenopathy. Does not bruise/bleed easily.  Psychiatric/Behavioral:  Positive for confusion, decreased concentration, dysphoric mood and sleep disturbance. Negative for agitation, behavioral problems, hallucinations, self-injury and suicidal ideas. The patient is nervous/anxious. The patient is not hyperactive.     Objective:  BP (!) 156/80 (BP Location: Left Arm, Patient Position: Sitting, Cuff Size: Normal) Comment: BP (R) 144/78 (L) 156/80  Pulse (!) 104   Temp 97.6 F (36.4 C) (Oral)   Resp 16   Ht 5\' 1"  (1.549 m)   Wt 147 lb 3.2 oz (66.8 kg)   SpO2 99%   BMI 27.81 kg/m   BP Readings from Last 3 Encounters:  11/01/23 (!) 156/80  08/27/23 118/68  04/22/23 116/68    Wt Readings from Last 3 Encounters:  11/01/23 147 lb 3.2 oz (66.8 kg)  08/27/23 144 lb (65.3 kg)  05/27/23 140 lb (63.5 kg)    Physical Exam Vitals reviewed.  Constitutional:      General: She is not in acute distress.    Appearance: She is not ill-appearing, toxic-appearing or diaphoretic.  HENT:     Mouth/Throat:     Mouth: Mucous membranes are moist.  Eyes:     General: No scleral icterus.    Conjunctiva/sclera: Conjunctivae normal.  Cardiovascular:     Rate and Rhythm: Regular rhythm. Tachycardia present.     Heart sounds: Normal heart sounds, S1 normal and S2 normal. No murmur heard.    Comments: EKG--- NSR, 98 bpm Septal infarct pattern is old ST/T wave changes are not new No LVH or Q waves Unchanged  Pulmonary:     Effort: Pulmonary effort is normal.     Breath sounds: No stridor. No wheezing, rhonchi or rales.  Abdominal:     General: Abdomen is flat.     Palpations: There is no mass.     Tenderness: There is no abdominal tenderness. There is no guarding.      Hernia: No hernia is present.  Musculoskeletal:     Cervical back: Neck supple.     Right lower leg: No edema.     Left lower leg: No edema.  Lymphadenopathy:     Cervical: No cervical adenopathy.  Skin:    General: Skin is warm and dry.     Findings: No rash.  Neurological:     General: No focal deficit present.     Mental Status: She is alert. Mental status is at baseline.  Psychiatric:        Attention and Perception: She is inattentive.        Mood and Affect: Mood is anxious. Mood is not depressed.        Speech: She is communicative. Speech is tangential.        Behavior: Behavior normal. Behavior is cooperative.        Thought Content: Thought content normal. Thought content is not paranoid or delusional. Thought  content does not include homicidal or suicidal ideation.     Lab Results  Component Value Date   WBC 6.7 11/01/2023   HGB 14.4 11/01/2023   HCT 42.2 11/01/2023   PLT 283.0 11/01/2023   GLUCOSE 98 11/01/2023   CHOL 279 (H) 11/01/2023   TRIG 141.0 11/01/2023   HDL 49.40 11/01/2023   LDLCALC 201 (H) 11/01/2023   ALT 19 11/01/2023   AST 17 11/01/2023   NA 144 11/01/2023   K 3.3 (L) 11/01/2023   CL 100 11/01/2023   CREATININE 0.68 11/01/2023   BUN 12 11/01/2023   CO2 30 11/01/2023   TSH 0.87 11/01/2023   PSA WNL 10/30/2015   INR 1.1 (H) 12/31/2015   HGBA1C 6.4 11/01/2023   MICROALBUR 9.0 (H) 04/22/2023    CT RENAL STONE STUDY Result Date: 04/27/2023 CLINICAL DATA:  Microscopic hematuria. Bilateral back pain and vaginal pain. EXAM: CT ABDOMEN AND PELVIS WITHOUT CONTRAST TECHNIQUE: Multidetector CT imaging of the abdomen and pelvis was performed following the standard protocol without IV contrast. RADIATION DOSE REDUCTION: This exam was performed according to the departmental dose-optimization program which includes automated exposure control, adjustment of the mA and/or kV according to patient size and/or use of iterative reconstruction technique.  COMPARISON:  None Available. FINDINGS: Lower chest: No acute findings. Hepatobiliary: No mass visualized on this unenhanced exam. Gallbladder is unremarkable. No evidence of biliary ductal dilatation. Pancreas: No mass or inflammatory process visualized on this unenhanced exam. Spleen:  Within normal limits in size. Adrenals/Urinary tract: 2 mm left renal calculus noted. No evidence of ureteral calculi or hydronephrosis. Unremarkable unopacified urinary bladder. Stomach/Bowel: No evidence of obstruction, inflammatory process, or abnormal fluid collections. Normal appendix visualized. Diverticulosis is seen mainly involving the sigmoid colon, however there is no evidence of diverticulitis. Vascular/Lymphatic: No pathologically enlarged lymph nodes identified. No evidence of abdominal aortic aneurysm. Reproductive:  No mass or other significant abnormality. Other:  None. Musculoskeletal:  No suspicious bone lesions identified. IMPRESSION: 2 mm left renal calculus. No evidence of ureteral calculi, hydronephrosis, or other acute findings. Sigmoid diverticulosis, without radiographic evidence of diverticulitis. Electronically Signed   By: Danae Orleans M.D.   On: 04/27/2023 10:41   CT Angio Chest W/Cm &/Or Wo Cm Result Date: 11/02/2023 CLINICAL DATA:  Pulmonary embolism EXAM: CT ANGIOGRAPHY CHEST WITH CONTRAST TECHNIQUE: Multidetector CT imaging of the chest was performed using the standard protocol during bolus administration of intravenous contrast. Multiplanar CT image reconstructions and MIPs were obtained to evaluate the vascular anatomy. RADIATION DOSE REDUCTION: This exam was performed according to the departmental dose-optimization program which includes automated exposure control, adjustment of the mA and/or kV according to patient size and/or use of iterative reconstruction technique. CONTRAST:  ISOVUE-370 IOPAMIDOL (ISOVUE-370) INJECTION 76% COMPARISON:  Chest x-ray November 01, 2023 FINDINGS:  Cardiovascular: Normal visualization of the pulmonary arteries without evidence of pulmonary embolism. Normal visualization of the aorta without evidence of dissection or aneurysm No pericardial effusion. Mediastinum/Nodes: No enlarged mediastinal, hilar, or axillary lymph nodes. Thyroid gland, trachea, and esophagus demonstrate no significant findings. Lungs/Pleura: Lungs are clear. No pleural effusion or pneumothorax. No pulmonary nodules or masses Upper Abdomen: No acute abnormality. Musculoskeletal: No chest wall abnormality. No acute or significant osseous findings. Review of the MIP images confirms the above findings. IMPRESSION: *No evidence of pulmonary embolism or aortic dissection. *No acute cardiopulmonary process. Electronically Signed   By: Shaaron Adler M.D.   On: 11/02/2023 16:01   DG Chest 2 View Result Date: 11/01/2023  CLINICAL DATA:  Cough with shortness of breath for 3 months. EXAM: CHEST - 2 VIEW COMPARISON:  Radiographs 04/22/2023 and 08/05/2022. FINDINGS: The heart size and mediastinal contours are normal. The lungs are clear. There is no pleural effusion or pneumothorax. No acute osseous findings are identified. Stable mild thoracic spine degenerative changes. IMPRESSION: No evidence of acute cardiopulmonary process. Electronically Signed   By: Carey Bullocks M.D.   On: 11/01/2023 09:49     CT Angio Chest W/Cm &/Or Wo Cm Result Date: 11/02/2023 CLINICAL DATA:  Pulmonary embolism EXAM: CT ANGIOGRAPHY CHEST WITH CONTRAST TECHNIQUE: Multidetector CT imaging of the chest was performed using the standard protocol during bolus administration of intravenous contrast. Multiplanar CT image reconstructions and MIPs were obtained to evaluate the vascular anatomy. RADIATION DOSE REDUCTION: This exam was performed according to the departmental dose-optimization program which includes automated exposure control, adjustment of the mA and/or kV according to patient size and/or use of iterative  reconstruction technique. CONTRAST:  ISOVUE-370 IOPAMIDOL (ISOVUE-370) INJECTION 76% COMPARISON:  Chest x-ray November 01, 2023 FINDINGS: Cardiovascular: Normal visualization of the pulmonary arteries without evidence of pulmonary embolism. Normal visualization of the aorta without evidence of dissection or aneurysm No pericardial effusion. Mediastinum/Nodes: No enlarged mediastinal, hilar, or axillary lymph nodes. Thyroid gland, trachea, and esophagus demonstrate no significant findings. Lungs/Pleura: Lungs are clear. No pleural effusion or pneumothorax. No pulmonary nodules or masses Upper Abdomen: No acute abnormality. Musculoskeletal: No chest wall abnormality. No acute or significant osseous findings. Review of the MIP images confirms the above findings. IMPRESSION: *No evidence of pulmonary embolism or aortic dissection. *No acute cardiopulmonary process. Electronically Signed   By: Shaaron Adler M.D.   On: 11/02/2023 16:01     Assessment & Plan:  Hyperlipidemia with target LDL less than 130 -     Lipid panel; Future -     Hepatic function panel; Future -     TSH; Future -     Rosuvastatin Calcium; Take 1 tablet (20 mg total) by mouth daily.  Dispense: 90 tablet; Refill: 0  Prediabetes -     Hemoglobin A1c; Future  Stage 3b chronic kidney disease (HCC) -     Urinalysis, Routine w reflex microscopic; Future -     Basic metabolic panel; Future  Chronic hypokalemia -     Basic metabolic panel; Future -     Magnesium; Future  Vitamin B12 deficiency neuropathy (HCC) -     CBC with Differential/Platelet; Future -     Vitamin B12; Future -     Folate; Future  Tachycardia -     EKG 12-Lead -     Troponin I (High Sensitivity); Future -     Brain natriuretic peptide; Future -     D-dimer, quantitative; Future -     CT Angio Chest Pulmonary Embolism (PE) W or WO Contrast; Future -     Nebivolol HCl; Take 1 tablet (5 mg total) by mouth daily.  Dispense: 90 tablet; Refill: 0  Cough  productive of purulent sputum -     DG Chest 2 View; Future  Abnormal electrocardiogram (ECG) (EKG) -     Troponin I (High Sensitivity); Future -     Brain natriuretic peptide; Future -     D-dimer, quantitative; Future -     CT Angio Chest Pulmonary Embolism (PE) W or WO Contrast; Future -     CT CORONARY MORPH W/CTA COR W/SCORE W/CA W/CM &/OR WO/CM; Future  Screening mammogram for breast  cancer -     Digital Screening Mammogram, Left and Right; Future  Moderate persistent asthma with acute exacerbation -     Budesonide; Take 2 mLs (0.5 mg total) by nebulization 2 (two) times daily.  Dispense: 120 mL; Refill: 3  D-dimer, elevated -     CT Angio Chest Pulmonary Embolism (PE) W or WO Contrast; Future  Essential hypertension -     Nebivolol HCl; Take 1 tablet (5 mg total) by mouth daily.  Dispense: 90 tablet; Refill: 0     Follow-up: Return in about 3 months (around 01/29/2024).  Sanda Linger, MD

## 2023-11-01 NOTE — Patient Instructions (Signed)

## 2023-11-02 ENCOUNTER — Ambulatory Visit
Admission: RE | Admit: 2023-11-02 | Discharge: 2023-11-02 | Discharge status: Home / Self Care | Payer: PPO | Source: Ambulatory Visit | Attending: Internal Medicine | Admitting: Internal Medicine

## 2023-11-02 ENCOUNTER — Encounter: Payer: Self-pay | Admitting: Internal Medicine

## 2023-11-02 DIAGNOSIS — R7989 Other specified abnormal findings of blood chemistry: Secondary | ICD-10-CM

## 2023-11-02 DIAGNOSIS — R000 Tachycardia, unspecified: Secondary | ICD-10-CM

## 2023-11-02 DIAGNOSIS — J4541 Moderate persistent asthma with (acute) exacerbation: Secondary | ICD-10-CM | POA: Diagnosis not present

## 2023-11-02 DIAGNOSIS — R9431 Abnormal electrocardiogram [ECG] [EKG]: Secondary | ICD-10-CM

## 2023-11-02 MED ORDER — IOPAMIDOL (ISOVUE-370) INJECTION 76%
100.0000 mL | Freq: Once | INTRAVENOUS | Status: AC | PRN
Start: 2023-11-02 — End: 2023-11-02
  Administered 2023-11-02: 100 mL via INTRAVENOUS

## 2023-11-04 MED ORDER — CLONAZEPAM 0.5 MG PO TABS: 0.5000 mg | ORAL_TABLET | Freq: Two times a day (BID) | ORAL | 0 refills | Status: DC | PRN

## 2023-11-04 MED ORDER — SHINGRIX 50 MCG/0.5ML IM SUSR: 0.5000 mL | Freq: Once | INTRAMUSCULAR | 1 refills | Status: AC

## 2023-11-04 MED ORDER — EZETIMIBE 10 MG PO TABS
10.0000 mg | ORAL_TABLET | Freq: Every day | ORAL | 0 refills | Status: DC
Start: 2023-11-04 — End: 2024-04-18

## 2023-11-04 NOTE — Assessment & Plan Note (Signed)
Exam completed, labs reviewed, vaccines reviewed and updated, cancer screenings are UTD, pt ed material was given.

## 2023-11-18 ENCOUNTER — Telehealth (HOSPITAL_COMMUNITY): Payer: Self-pay | Admitting: *Deleted

## 2023-11-18 ENCOUNTER — Encounter (HOSPITAL_COMMUNITY): Payer: Self-pay

## 2023-11-18 MED ORDER — METOPROLOL TARTRATE 100 MG PO TABS: ORAL_TABLET | ORAL | 0 refills | Status: DC

## 2023-11-18 NOTE — Telephone Encounter (Signed)
 Reaching out to patient to offer assistance regarding upcoming cardiac imaging study; pt verbalizes understanding of appt date/time, parking situation and where to check in, pre-test NPO status and medications ordered, and verified current allergies; name and call back number provided for further questions should they arise  Julie Brick RN Navigator Cardiac Imaging [facility] [phone] office [phone] cell  Patient to take 100mg  metoprolol tartrate two hours prior to her cardiac CT scan.  She is aware to arrive at 7:30 AM.

## 2023-11-22 ENCOUNTER — Encounter: Payer: Self-pay | Admitting: Internal Medicine

## 2023-11-22 ENCOUNTER — Ambulatory Visit (HOSPITAL_COMMUNITY)
Admission: RE | Admit: 2023-11-22 | Discharge: 2023-11-22 | Disposition: A | Discharge status: Home / Self Care | Source: Ambulatory Visit | Attending: Internal Medicine | Admitting: Internal Medicine

## 2023-11-22 DIAGNOSIS — R9431 Abnormal electrocardiogram [ECG] [EKG]: Secondary | ICD-10-CM | POA: Diagnosis not present

## 2023-11-22 MED ORDER — NITROGLYCERIN 0.4 MG SL SUBL
0.8000 mg | SUBLINGUAL_TABLET | Freq: Once | SUBLINGUAL | Status: AC
  Administered 2023-11-22: 0.8 mg via SUBLINGUAL

## 2023-11-22 MED ORDER — METOPROLOL TARTRATE 5 MG/5ML IV SOLN: 10.0000 mg | Freq: Once | INTRAVENOUS | Status: DC | PRN

## 2023-11-22 MED ORDER — NITROGLYCERIN 0.4 MG SL SUBL
SUBLINGUAL_TABLET | SUBLINGUAL | Status: AC
  Filled 2023-11-22: qty 2

## 2023-11-22 MED ORDER — DILTIAZEM HCL 25 MG/5ML IV SOLN: 10.0000 mg | INTRAVENOUS | Status: DC | PRN

## 2023-11-22 MED ORDER — IOHEXOL 350 MG/ML SOLN
95.0000 mL | Freq: Once | INTRAVENOUS | Status: AC | PRN
Start: 2023-11-22 — End: 2023-11-22
  Administered 2023-11-22: 95 mL via INTRAVENOUS

## 2023-11-30 DIAGNOSIS — R102 Pelvic and perineal pain: Secondary | ICD-10-CM | POA: Diagnosis not present

## 2023-11-30 DIAGNOSIS — N898 Other specified noninflammatory disorders of vagina: Secondary | ICD-10-CM | POA: Diagnosis not present

## 2023-12-03 ENCOUNTER — Ambulatory Visit: Payer: Self-pay | Admitting: Internal Medicine

## 2023-12-03 NOTE — Telephone Encounter (Addendum)
 Copied from CRM [id number]. Topic: Clinical - Red Word Triage >> Dec 03, 2023 11:38 AM Mackie Pai E wrote: [facility] that prompted transfer to Nurse Triage: Severe pain. Patient has been experiencing hand pain for the past several weeks. Patient has neuropathy and rated her pain a level 12 out of 10.  Chief Complaint: Neuropathy Symptoms: Pain, numbness/tingling in hands and feet Frequency: Chronic, worsening past few weeks Pertinent Negatives: Patient denies relief Disposition: [] ED /[] Urgent Care (no appt availability in office) / [x] Appointment(In office/virtual)/ []  [facility]/ [] Home Care/ [x] Refused Recommended Disposition /[] [facility]/ []  Follow-up with PCP Additional Notes: Patient called in to report worsening pain from neuropathy. Patient stated she has a history of neuropathy in her hands and feet and takes Lyrica to manage symptoms. Patient stated that symptoms have gotten worse over the past few weeks and the medication is no longer helping. Patient reported numbness and tingling in bilateral hands and feet. Patient reported severe pain in bilateral hands and feet. Patient denied sudden changes in speech, vision, balance and loss of sensation. This RN advised patient to see a provider within 3 days, per protocol. No availability with PCP. This RN offered to scheduled patient with another provider in the office. Patient declined and stated she only wanted to see Dr. Yetta Barre. This RN advised that I would route this conversation to the office for their discretion on scheduling. Patient is requesting a call back at [phone].   Reason for Disposition  [1] Numbness or tingling in one or both hands AND [2] is a chronic symptom (recurrent or ongoing AND present > 4 weeks)  Answer Assessment - Initial Assessment Questions 1. SYMPTOM: "What is the main symptom you are concerned about?" (e.g., weakness, numbness)     Numbness and tingling in hands and feet 2. ONSET: "When  did this start?" (minutes, hours, days; while sleeping)     Worsening over pas few weeks, outrageous this morning 3. LAST NORMAL: "When was the last time you (the patient) were normal (no symptoms)?"     History of neuropathy 4. PATTERN "Does this come and go, or has it been constant since it started?"  "Is it present now?"     States sensation is constant, gets relief at some point during the day, keeps her from sleeping 5. CARDIAC SYMPTOMS: "Have you had any of the following symptoms: chest pain, difficulty breathing, palpitations?"     N/A 6. NEUROLOGIC SYMPTOMS: "Have you had any of the following symptoms: headache, dizziness, vision loss, double vision, changes in speech, unsteady on your feet?"     Headache off and on, lightheaded a couple days, denies changes in vision and speech, denies changes in balance 7. OTHER SYMPTOMS: "Do you have any other symptoms?"     N/A  Protocols used: Neurologic Deficit-A-AH

## 2023-12-03 NOTE — Telephone Encounter (Signed)
 No availability w PCP. Pt will need to schedule w an available provider however pt declined

## 2023-12-14 ENCOUNTER — Other Ambulatory Visit: Payer: Self-pay | Admitting: Internal Medicine

## 2023-12-14 DIAGNOSIS — E1140 Type 2 diabetes mellitus with diabetic neuropathy, unspecified: Secondary | ICD-10-CM

## 2023-12-21 DIAGNOSIS — J4541 Moderate persistent asthma with (acute) exacerbation: Secondary | ICD-10-CM | POA: Diagnosis not present

## 2023-12-30 ENCOUNTER — Other Ambulatory Visit: Payer: Self-pay | Admitting: Internal Medicine

## 2023-12-30 DIAGNOSIS — J4541 Moderate persistent asthma with (acute) exacerbation: Secondary | ICD-10-CM

## 2024-01-04 ENCOUNTER — Telehealth: Payer: Self-pay | Admitting: Internal Medicine

## 2024-01-04 NOTE — Telephone Encounter (Unsigned)
 Copied from CRM [id number]. Topic: General - Other >> Jan 04, 2024  9:36 AM Kita Perish H wrote: Reason for CRM: Patient is calling to request a new nebulizer, patient states the one she has now isn't pumping correctly and needs a new one. Patient doesn't have any information on where she needs one ordered from, wants a callback today.  [place] [phone]

## 2024-01-05 DIAGNOSIS — K5900 Constipation, unspecified: Secondary | ICD-10-CM | POA: Diagnosis not present

## 2024-01-05 DIAGNOSIS — R102 Pelvic and perineal pain: Secondary | ICD-10-CM | POA: Diagnosis not present

## 2024-01-05 DIAGNOSIS — M6281 Muscle weakness (generalized): Secondary | ICD-10-CM | POA: Diagnosis not present

## 2024-01-05 DIAGNOSIS — M62838 Other muscle spasm: Secondary | ICD-10-CM | POA: Diagnosis not present

## 2024-01-05 NOTE — Telephone Encounter (Signed)
 Pt is calling for a status update on her nebulizer, please call pt ASAP: [phone]

## 2024-01-05 NOTE — Telephone Encounter (Signed)
 Copied from CRM [id number]. Topic: General - Other >> Jan 05, 2024  3:53 PM Aisha D wrote: Reason for CRM: Patient is calling to request a new nebulizer, patient states the one she has now isn't pumping correctly and needs a new one. Patient doesn't have any information on where she needs one ordered from, wants a callback today.  Update: Patient is calling back today regarding the same concern. Patient stated that she never received a call back yesterday and would like to speak to someone today regarding this. Patient stated that she's unable to take the medication without the nebulizer.

## 2024-01-07 ENCOUNTER — Other Ambulatory Visit: Payer: Self-pay | Admitting: Internal Medicine

## 2024-01-07 DIAGNOSIS — J4540 Moderate persistent asthma, uncomplicated: Secondary | ICD-10-CM

## 2024-01-07 NOTE — Telephone Encounter (Signed)
 Can you write the script for her nebulizer?

## 2024-01-10 ENCOUNTER — Ambulatory Visit: Payer: Self-pay

## 2024-01-10 ENCOUNTER — Other Ambulatory Visit: Payer: Self-pay | Admitting: Internal Medicine

## 2024-01-10 NOTE — Telephone Encounter (Signed)
 Chief Complaint: Shortness of breath due to nebulizer machine is broken Symptoms: Mild SOB with activity, runny nose w/yellow mucus when blowing nose every now and then Frequency: Onset since last week when broke nebulizer machine and unable to do treatments Pertinent Negatives: Patient denies other symptoms Disposition: [] ED /[] Urgent Care (no appt availability in office) / [x] Appointment(In office/virtual)/ []  [facility]/ [] Home Care/ [] Refused Recommended Disposition /[] [facility]/ []  Follow-up with PCP Additional Notes: Patient says her SOB is mild w/activity and she's only using her inhaler a couple times a day. She says she's been trying to get the nebulizer for a week and just received a call that the paperwork is not filled out right, so it is being faxed back to the office. This is after receiving a call from [employer], CMA that it was signed and faxed, see encounter 01/04/24. Advised OV just so that Dr. Rochelle Chu could listen to lungs and determine if additional treatment is needed. She says she will only see him and no available appointments. Advised I will send this to Dr. Rochelle Chu for review and someone will call with his recommendation.   Reason for Disposition  [1] MODERATE longstanding difficulty breathing (e.g., speaks in phrases, SOB even at rest, pulse 100-120) AND [2] SAME as normal  Answer Assessment - Initial Assessment Questions 1. RESPIRATORY STATUS: "Describe your breathing?" (e.g., wheezing, shortness of breath, unable to speak, severe coughing)      Some wheezing at night 2. ONSET: "When did this breathing problem begin?"      1 week ago since breaking nebulizer machine 3. PATTERN "Does the difficult breathing come and go, or has it been constant since it started?"      Comes and goes 4. SEVERITY: "How bad is your breathing?" (e.g., mild, moderate, severe)    - MILD: No SOB at rest, mild SOB with walking, speaks normally in sentences, can lie down, no  retractions, pulse < 100.    - MODERATE: SOB at rest, SOB with minimal exertion and prefers to sit, cannot lie down flat, speaks in phrases, mild retractions, audible wheezing, pulse 100-120.    - SEVERE: Very SOB at rest, speaks in single words, struggling to breathe, sitting hunched forward, retractions, pulse > 120      Mild 5. RECURRENT SYMPTOM: "Have you had difficulty breathing before?" If Yes, ask: "When was the last time?" and "What happened that time?"      Yes 6. LUNG HISTORY: "Do you have any history of lung disease?"  (e.g., pulmonary embolus, asthma, emphysema)     Yes 7. CAUSE: "What do you think is causing the breathing problem?"      Pollen, allergies 8. OTHER SYMPTOMS: "Do you have any other symptoms? (e.g., dizziness, runny nose, cough, chest pain, fever)     Runny nose, blow nose yellow mucus every now and then when blow nose  Protocols used: Breathing Difficulty-A-AH

## 2024-01-10 NOTE — Telephone Encounter (Signed)
 Copied from CRM [id number]. Topic: General - Other >> Jan 10, 2024 10:56 AM Julie Rivas wrote: Patient calling back to follow up in regards to her requesting a nebulizer machine. Patient states that nobody has returned her call and she is very upset.

## 2024-01-10 NOTE — Telephone Encounter (Signed)
 Copied from CRM [id number]. Topic: Clinical - Medication Refill >> Jan 10, 2024 10:59 AM Marlan Silva wrote: Most Recent Primary Care Visit:  Provider: Arcadio Knuckles  Department: [facility]  Visit Type: OFFICE VISIT  Date: 11/01/2023  Medication: For home use only DME Nebulizer machine (Order [id number])  Has the patient contacted their pharmacy? No (Agent: If no, request that the patient contact the pharmacy for the refill. If patient does not wish to contact the pharmacy document the reason why and proceed with request.) (Agent: If yes, when and what did the pharmacy advise?)  Is this the correct pharmacy for this prescription? Yes If no, delete pharmacy and type the correct one.  This is the patient's preferred pharmacy:  [facility] [address] [place], [place] - [address] [place] [address] Phone: [phone] Fax: [phone]  [facility] - [place], [place] - [address] [facility] [place]. [address] [employer] [address] [place] [address] Phone: [phone] Fax: [phone]   Has the prescription been filled recently? No  Is the patient out of the medication? Yes  Has the patient been seen for an appointment in the last year OR does the patient have an upcoming appointment? Yes  Can we respond through MyChart? Yes  Agent: Please be advised that Rx refills may take up to 3 business days. We ask that you follow-up with your pharmacy.

## 2024-01-10 NOTE — Telephone Encounter (Signed)
 This has been sign dated and faxed. Patient has been made aware.

## 2024-01-10 NOTE — Telephone Encounter (Signed)
 Pt requested a prescription for a new nebulizer 4/29. Pt called for an update today, stating her "breathing is bothering her." RN called the pt to triage her for her symptoms. Pt did not answer, RN LVM with a callback number.

## 2024-01-12 ENCOUNTER — Ambulatory Visit: Admitting: Family Medicine

## 2024-01-12 ENCOUNTER — Encounter: Payer: Self-pay | Admitting: Family Medicine

## 2024-01-12 VITALS — BP 124/82 | HR 89 | Temp 97.7°F | Ht 61.0 in | Wt 144.0 lb

## 2024-01-12 DIAGNOSIS — H66001 Acute suppurative otitis media without spontaneous rupture of ear drum, right ear: Secondary | ICD-10-CM | POA: Insufficient documentation

## 2024-01-12 DIAGNOSIS — B9689 Other specified bacterial agents as the cause of diseases classified elsewhere: Secondary | ICD-10-CM | POA: Diagnosis not present

## 2024-01-12 DIAGNOSIS — J329 Chronic sinusitis, unspecified: Secondary | ICD-10-CM | POA: Diagnosis not present

## 2024-01-12 DIAGNOSIS — J4541 Moderate persistent asthma with (acute) exacerbation: Secondary | ICD-10-CM

## 2024-01-12 MED ORDER — METHYLPREDNISOLONE ACETATE 40 MG/ML IJ SUSP
40.0000 mg | Freq: Once | INTRAMUSCULAR | Status: AC
  Administered 2024-01-12: 40 mg via INTRAMUSCULAR

## 2024-01-12 MED ORDER — AMOXICILLIN-POT CLAVULANATE 875-125 MG PO TABS: 1.0000 | ORAL_TABLET | Freq: Two times a day (BID) | ORAL | 0 refills | Status: DC

## 2024-01-12 MED ORDER — PREDNISONE 20 MG PO TABS: 40.0000 mg | ORAL_TABLET | Freq: Every day | ORAL | 0 refills | Status: DC

## 2024-01-12 MED ORDER — FLUTICASONE-SALMETEROL 250-50 MCG/ACT IN AEPB: 1.0000 | INHALATION_SPRAY | Freq: Two times a day (BID) | RESPIRATORY_TRACT | 3 refills | Status: AC

## 2024-01-12 MED ORDER — BUDESONIDE 0.5 MG/2ML IN SUSP: 0.5000 mg | Freq: Two times a day (BID) | RESPIRATORY_TRACT | 3 refills | Status: AC

## 2024-01-12 NOTE — Telephone Encounter (Signed)
Patient has been seen in office today. 

## 2024-01-12 NOTE — Patient Instructions (Addendum)
 You have received a steroid injection in the office today.  I have sent in prednisone  for you to take 2 tablets once daily in the morning with breakfast for the next 5 days.  I have sent in Augmentin  for you to take twice a day for 10 days.  This medication can upset your stomach, so I tell everyone to take it with a meal.  I have sent in a steroid inhaler for you to use one puff twice a day as your maintenance inhaler.   Call us  and let us  know the company you would like to have us  send an order for a new nebulizer machine.  Follow-up with me for new or worsening symptoms.

## 2024-01-12 NOTE — Progress Notes (Signed)
 Acute Office Visit  Subjective:     Patient ID: Julie Rivas, female    DOB: 1956-07-18, 68 y.o.   MRN: [id number]  Chief Complaint  Patient presents with   Acute Visit    Ongoing for about 2 weeks, bad congestion, right ear clogged, headache, sore throat, yellow mucus, not wheezing as much.     HPI Patient is in today for evaluation of cough, wheezing, fatigue, for the last 2 weeks. Reports right ear pain and right sided facial pain and pressure for the last 3 to 4 days.  Reports coughing so much that it is making her sit on her stomach. Has tried pulmicort  and albuterol  nebulizer with some temporary relief.  Also taking singulair . Denies known sick contacts. Denies abdominal pain, diarrhea, rash, fever, chills, other symptoms.  Medical hx as outlined below.  ROS Per HPI      Objective:    BP 124/82 (BP Location: Left Arm, Patient Position: Sitting)   Pulse 89   Temp 97.7 F (36.5 C) (Temporal)   Ht 5\' 1"  (1.549 m)   Wt 144 lb (65.3 kg)   SpO2 97%   BMI 27.21 kg/m    Physical Exam Vitals and nursing note reviewed.  Constitutional:      General: She is not in acute distress.    Appearance: She is ill-appearing.  HENT:     Head: Normocephalic and atraumatic.     Right Ear: External ear normal. A middle ear effusion is present. Tympanic membrane is erythematous and bulging.     Left Ear: External ear normal. A middle ear effusion is present. Tympanic membrane is not erythematous or bulging.     Nose: Congestion and rhinorrhea present.     Right Sinus: Maxillary sinus tenderness and frontal sinus tenderness present.     Mouth/Throat:     Mouth: Mucous membranes are moist.     Pharynx: No oropharyngeal exudate or posterior oropharyngeal erythema.     Comments: Oropharyngeal cobblestoning   Eyes:     General:        Right eye: No discharge (clear).     Extraocular Movements: Extraocular movements intact.  Cardiovascular:     Rate and Rhythm: Normal rate  and regular rhythm.     Heart sounds: Normal heart sounds.  Pulmonary:     Effort: Pulmonary effort is normal. No respiratory distress.     Breath sounds: No wheezing, rhonchi or rales.  Musculoskeletal:     Cervical back: Normal range of motion and neck supple.  Lymphadenopathy:     Cervical: Cervical adenopathy present.  Skin:    General: Skin is warm and dry.  Neurological:     General: No focal deficit present.     Mental Status: She is alert and oriented to person, place, and time.     No results found for any visits on 01/12/24.      Assessment & Plan:   Non-recurrent acute suppurative otitis media of right ear without spontaneous rupture of tympanic membrane -     Amoxicillin -Pot Clavulanate; Take 1 tablet by mouth 2 (two) times daily.  Dispense: 20 tablet; Refill: 0 -     predniSONE ; Take 2 tablets (40 mg total) by mouth daily for 5 days.  Dispense: 10 tablet; Refill: 0 -     methylPREDNISolone  Acetate  Bacterial sinusitis -     Amoxicillin -Pot Clavulanate; Take 1 tablet by mouth 2 (two) times daily.  Dispense: 20 tablet; Refill: 0 -  predniSONE ; Take 2 tablets (40 mg total) by mouth daily for 5 days.  Dispense: 10 tablet; Refill: 0 -     methylPREDNISolone  Acetate  Moderate persistent asthma with acute exacerbation -     Budesonide ; Take 2 mLs (0.5 mg total) by nebulization 2 (two) times daily.  Dispense: 120 mL; Refill: 3 -     Fluticasone -Salmeterol; Inhale 1 puff into the lungs in the morning and at bedtime.  Dispense: 60 each; Refill: 3 -     predniSONE ; Take 2 tablets (40 mg total) by mouth daily for 5 days.  Dispense: 10 tablet; Refill: 0 -     methylPREDNISolone  Acetate     Meds ordered this encounter  Medications   budesonide  (PULMICORT ) 0.5 MG/2ML nebulizer solution    Sig: Take 2 mLs (0.5 mg total) by nebulization 2 (two) times daily.    Dispense:  120 mL    Refill:  3   fluticasone -salmeterol (WIXELA INHUB) 250-50 MCG/ACT AEPB    Sig: Inhale 1  puff into the lungs in the morning and at bedtime.    Dispense:  60 each    Refill:  3   amoxicillin -clavulanate (AUGMENTIN ) 875-125 MG tablet    Sig: Take 1 tablet by mouth 2 (two) times daily.    Dispense:  20 tablet    Refill:  0   predniSONE  (DELTASONE ) 20 MG tablet    Sig: Take 2 tablets (40 mg total) by mouth daily for 5 days.    Dispense:  10 tablet    Refill:  0   methylPREDNISolone  acetate (DEPO-MEDROL ) injection 40 mg    Return if symptoms worsen or fail to improve.  Wellington Half, FNP

## 2024-01-18 DIAGNOSIS — J4541 Moderate persistent asthma with (acute) exacerbation: Secondary | ICD-10-CM | POA: Diagnosis not present

## 2024-01-19 DIAGNOSIS — R102 Pelvic and perineal pain: Secondary | ICD-10-CM | POA: Diagnosis not present

## 2024-01-19 DIAGNOSIS — M62838 Other muscle spasm: Secondary | ICD-10-CM | POA: Diagnosis not present

## 2024-01-19 DIAGNOSIS — M6281 Muscle weakness (generalized): Secondary | ICD-10-CM | POA: Diagnosis not present

## 2024-01-19 DIAGNOSIS — K5900 Constipation, unspecified: Secondary | ICD-10-CM | POA: Diagnosis not present

## 2024-01-26 DIAGNOSIS — M6281 Muscle weakness (generalized): Secondary | ICD-10-CM | POA: Diagnosis not present

## 2024-01-26 DIAGNOSIS — R102 Pelvic and perineal pain: Secondary | ICD-10-CM | POA: Diagnosis not present

## 2024-01-26 DIAGNOSIS — K5900 Constipation, unspecified: Secondary | ICD-10-CM | POA: Diagnosis not present

## 2024-01-26 DIAGNOSIS — M62838 Other muscle spasm: Secondary | ICD-10-CM | POA: Diagnosis not present

## 2024-01-27 ENCOUNTER — Other Ambulatory Visit: Payer: Self-pay | Admitting: Internal Medicine

## 2024-01-27 DIAGNOSIS — E785 Hyperlipidemia, unspecified: Secondary | ICD-10-CM

## 2024-01-28 ENCOUNTER — Ambulatory Visit: Admitting: Family Medicine

## 2024-02-04 ENCOUNTER — Encounter: Payer: Self-pay | Admitting: Family Medicine

## 2024-02-04 ENCOUNTER — Ambulatory Visit: Admitting: Family Medicine

## 2024-02-04 VITALS — BP 110/68 | HR 104 | Temp 98.7°F | Ht 61.0 in | Wt 140.6 lb

## 2024-02-04 DIAGNOSIS — E663 Overweight: Secondary | ICD-10-CM | POA: Diagnosis not present

## 2024-02-04 DIAGNOSIS — I10 Essential (primary) hypertension: Secondary | ICD-10-CM

## 2024-02-04 DIAGNOSIS — J329 Chronic sinusitis, unspecified: Secondary | ICD-10-CM

## 2024-02-04 DIAGNOSIS — E559 Vitamin D deficiency, unspecified: Secondary | ICD-10-CM | POA: Diagnosis not present

## 2024-02-04 DIAGNOSIS — B9689 Other specified bacterial agents as the cause of diseases classified elsewhere: Secondary | ICD-10-CM | POA: Diagnosis not present

## 2024-02-04 DIAGNOSIS — Z79899 Other long term (current) drug therapy: Secondary | ICD-10-CM

## 2024-02-04 DIAGNOSIS — N1832 Chronic kidney disease, stage 3b: Secondary | ICD-10-CM | POA: Diagnosis not present

## 2024-02-04 DIAGNOSIS — Z6826 Body mass index (BMI) 26.0-26.9, adult: Secondary | ICD-10-CM

## 2024-02-04 LAB — CBC WITH DIFFERENTIAL/PLATELET
Basophils Absolute: 0.1 10*3/uL (ref 0.0–0.1)
Basophils Relative: 1.1 % (ref 0.0–3.0)
Eosinophils Absolute: 0 10*3/uL (ref 0.0–0.7)
Eosinophils Relative: 0.3 % (ref 0.0–5.0)
HCT: 41.4 % (ref 36.0–46.0)
Hemoglobin: 14 g/dL (ref 12.0–15.0)
Lymphocytes Relative: 42.2 % (ref 12.0–46.0)
Lymphs Abs: 2.7 10*3/uL (ref 0.7–4.0)
MCHC: 33.8 g/dL (ref 30.0–36.0)
MCV: 93.3 fl (ref 78.0–100.0)
Monocytes Absolute: 0.4 10*3/uL (ref 0.1–1.0)
Monocytes Relative: 6.2 % (ref 3.0–12.0)
Neutro Abs: 3.2 10*3/uL (ref 1.4–7.7)
Neutrophils Relative %: 50.2 % (ref 43.0–77.0)
Platelets: 232 10*3/uL (ref 150.0–400.0)
RBC: 4.44 Mil/uL (ref 3.87–5.11)
RDW: 13.5 % (ref 11.5–15.5)
WBC: 6.3 10*3/uL (ref 4.0–10.5)

## 2024-02-04 LAB — COMPREHENSIVE METABOLIC PANEL WITH GFR
ALT: 21 U/L (ref 0–35)
AST: 18 U/L (ref 0–37)
Albumin: 4.2 g/dL (ref 3.5–5.2)
Alkaline Phosphatase: 92 U/L (ref 39–117)
BUN: 15 mg/dL (ref 6–23)
CO2: 30 meq/L (ref 19–32)
Calcium: 9.5 mg/dL (ref 8.4–10.5)
Chloride: 104 meq/L (ref 96–112)
Creatinine, Ser: 0.73 mg/dL (ref 0.40–1.20)
GFR: 83.57 mL/min (ref >60.00)
Glucose, Bld: 76 mg/dL (ref 70–99)
Potassium: 3 meq/L — ABNORMAL LOW (ref 3.5–5.1)
Sodium: 144 meq/L (ref 135–145)
Total Bilirubin: 0.4 mg/dL (ref 0.2–1.2)
Total Protein: 7.1 g/dL (ref 6.0–8.3)

## 2024-02-04 LAB — VITAMIN D 25 HYDROXY (VIT D DEFICIENCY, FRACTURES): VITD: 15.03 ng/mL — ABNORMAL LOW (ref 30.00–100.00)

## 2024-02-04 MED ORDER — CIPROFLOXACIN HCL 500 MG PO TABS: 500.0000 mg | ORAL_TABLET | Freq: Two times a day (BID) | ORAL | 0 refills | Status: AC

## 2024-02-04 NOTE — Patient Instructions (Signed)
 I have sent in an antibiotic for you to take 1 tablet by mouth twice a day for 5 days.  Please eat with this medication, it can upset your stomach if you do not.  Take all of the medication even if you are feeling better.  We are checking labs today, will be in contact with any results that require further attention  Follow-up with me for new or worsening symptoms.

## 2024-02-04 NOTE — Progress Notes (Signed)
 Acute Office Visit  Subjective:     Patient ID: Julie Rivas, female    DOB: 1956/05/25, 68 y.o.   MRN: [id number]  Chief Complaint  Patient presents with   Ear Pain    Continued bilateral ear pain and headaches     HPI Patient is in today for continued sinus pain and pressure, continued bilateral ear pain after being treated for sinusitis and ear infection on 01/12/2024 by me with Augmentin  and prednisone .  Also had bronchitis at that visit. Reports that the bronchitis has improved. States she has had a headache since she saw us  last.  Has not attempted OTC treatment other than Tylenol. Has been using saline nasal spray. States she has not been using tizanidine  over the last month. Denies known sick contacts. Denies abdominal pain, nausea, vomiting, diarrhea, rash, fever, chills, other symptoms.   ROS Per HPI      Objective:    BP 110/68 (BP Location: Left Arm, Patient Position: Sitting)   Pulse (!) 104   Temp 98.7 F (37.1 C) (Temporal)   Ht 5\' 1"  (1.549 m)   Wt 140 lb 9.6 oz (63.8 kg)   SpO2 99%   BMI 26.57 kg/m    Physical Exam Vitals and nursing note reviewed.  Constitutional:      General: She is not in acute distress.    Appearance: She is ill-appearing.     Comments: Appears fatigued  HENT:     Head: Normocephalic and atraumatic.     Right Ear: External ear normal. A middle ear effusion is present. Tympanic membrane is erythematous and bulging.     Left Ear: External ear normal. A middle ear effusion is present. Tympanic membrane is not erythematous or bulging.     Nose: Congestion and rhinorrhea present.     Right Sinus: Maxillary sinus tenderness and frontal sinus tenderness present.     Mouth/Throat:     Mouth: Mucous membranes are moist.     Pharynx: No oropharyngeal exudate or posterior oropharyngeal erythema.     Comments: Oropharyngeal cobblestoning   Eyes:     General:        Right eye: No discharge (clear).     Extraocular Movements:  Extraocular movements intact.  Cardiovascular:     Rate and Rhythm: Normal rate and regular rhythm.     Heart sounds: Normal heart sounds.  Pulmonary:     Effort: Pulmonary effort is normal. No respiratory distress.     Breath sounds: No wheezing, rhonchi or rales.  Musculoskeletal:     Cervical back: Normal range of motion and neck supple.  Lymphadenopathy:     Cervical: Cervical adenopathy present.  Skin:    General: Skin is warm and dry.  Neurological:     General: No focal deficit present.     Mental Status: She is alert and oriented to person, place, and time.    Results for orders placed or performed in visit on 02/04/24  CBC with Differential/Platelet  Result Value Ref Range   WBC 6.3 4.0 - 10.5 K/uL   RBC 4.44 3.87 - 5.11 Mil/uL   Hemoglobin 14.0 12.0 - 15.0 g/dL   HCT 40.9 81.1 - 91.4 %   MCV 93.3 78.0 - 100.0 fl   MCHC 33.8 30.0 - 36.0 g/dL   RDW 78.2 95.6 - 21.3 %   Platelets 232.0 150.0 - 400.0 K/uL   Neutrophils Relative % 50.2 43.0 - 77.0 %   Lymphocytes Relative 42.2 12.0 - 46.0 %  Monocytes Relative 6.2 3.0 - 12.0 %   Eosinophils Relative 0.3 0.0 - 5.0 %   Basophils Relative 1.1 0.0 - 3.0 %   Neutro Abs 3.2 1.4 - 7.7 K/uL   Lymphs Abs 2.7 0.7 - 4.0 K/uL   Monocytes Absolute 0.4 0.1 - 1.0 K/uL   Eosinophils Absolute 0.0 0.0 - 0.7 K/uL   Basophils Absolute 0.1 0.0 - 0.1 K/uL  Comprehensive metabolic panel with GFR  Result Value Ref Range   Sodium 144 135 - 145 mEq/L   Potassium 3.0 (L) 3.5 - 5.1 mEq/L   Chloride 104 96 - 112 mEq/L   CO2 30 19 - 32 mEq/L   Glucose, Bld 76 70 - 99 mg/dL   BUN 15 6 - 23 mg/dL   Creatinine, Ser 1.32 0.40 - 1.20 mg/dL   Total Bilirubin 0.4 0.2 - 1.2 mg/dL   Alkaline Phosphatase 92 39 - 117 U/L   AST 18 0 - 37 U/L   ALT 21 0 - 35 U/L   Total Protein 7.1 6.0 - 8.3 g/dL   Albumin 4.2 3.5 - 5.2 g/dL   GFR 44.01 >02.72 mL/min   Calcium  9.5 8.4 - 10.5 mg/dL  VITAMIN D  25 Hydroxy (Vit-D Deficiency, Fractures)  Result Value  Ref Range   VITD 15.03 (L) 30.00 - 100.00 ng/mL        Assessment & Plan:   Bacterial sinusitis Assessment & Plan: Will treat with Cipro  given failure of Augmentin  and prednisone . CBC, CMP   Orders: -     Ciprofloxacin  HCl; Take 1 tablet (500 mg total) by mouth 2 (two) times daily for 5 days.  Dispense: 10 tablet; Refill: 0 -     CBC with Differential/Platelet -     Comprehensive metabolic panel with GFR -     VITAMIN D  25 Hydroxy (Vit-D Deficiency, Fractures)  Stage 3b chronic kidney disease (HCC) Assessment & Plan: CMP today Discussed to push fluids and stay well-hydrated  Orders: -     Comprehensive metabolic panel with GFR  Vitamin D  deficiency Assessment & Plan: Vitamin D  levels today  Orders: -     VITAMIN D  25 Hydroxy (Vit-D Deficiency, Fractures)  Overweight with body mass index (BMI) of 26 to 26.9 in adult Assessment & Plan: Continue current efforts and healthy diet and activity level   Medication management Assessment & Plan: Labs today, will dose adjust as needed   Essential hypertension Assessment & Plan: Controlled CBC, CMP today      Meds ordered this encounter  Medications   ciprofloxacin  (CIPRO ) 500 MG tablet    Sig: Take 1 tablet (500 mg total) by mouth 2 (two) times daily for 5 days.    Dispense:  10 tablet    Refill:  0    Return if symptoms worsen or fail to improve.  Wellington Half, FNP

## 2024-02-06 ENCOUNTER — Ambulatory Visit: Payer: Self-pay | Admitting: Family Medicine

## 2024-02-06 ENCOUNTER — Encounter: Payer: Self-pay | Admitting: Family Medicine

## 2024-02-06 DIAGNOSIS — E559 Vitamin D deficiency, unspecified: Secondary | ICD-10-CM

## 2024-02-06 DIAGNOSIS — E663 Overweight: Secondary | ICD-10-CM | POA: Insufficient documentation

## 2024-02-06 MED ORDER — VITAMIN D (ERGOCALCIFEROL) 1.25 MG (50000 UNIT) PO CAPS: 50000.0000 [IU] | ORAL_CAPSULE | ORAL | 0 refills | Status: DC

## 2024-02-06 NOTE — Assessment & Plan Note (Signed)
 Controlled CBC, CMP today

## 2024-02-06 NOTE — Assessment & Plan Note (Signed)
-   Vitamin D levels today

## 2024-02-06 NOTE — Assessment & Plan Note (Signed)
 Continue current efforts and healthy diet and activity level

## 2024-02-06 NOTE — Assessment & Plan Note (Signed)
 Labs today, will dose adjust as needed

## 2024-02-06 NOTE — Assessment & Plan Note (Signed)
 Will treat with Cipro  given failure of Augmentin  and prednisone . CBC, CMP

## 2024-02-06 NOTE — Assessment & Plan Note (Signed)
 CMP today Discussed to push fluids and stay well-hydrated

## 2024-02-18 DIAGNOSIS — J4541 Moderate persistent asthma with (acute) exacerbation: Secondary | ICD-10-CM | POA: Diagnosis not present

## 2024-03-19 DIAGNOSIS — J4541 Moderate persistent asthma with (acute) exacerbation: Secondary | ICD-10-CM | POA: Diagnosis not present

## 2024-04-11 ENCOUNTER — Other Ambulatory Visit: Payer: Self-pay | Admitting: Family Medicine

## 2024-04-11 ENCOUNTER — Other Ambulatory Visit: Payer: Self-pay | Admitting: Internal Medicine

## 2024-04-11 DIAGNOSIS — E1140 Type 2 diabetes mellitus with diabetic neuropathy, unspecified: Secondary | ICD-10-CM

## 2024-04-11 DIAGNOSIS — G8929 Other chronic pain: Secondary | ICD-10-CM

## 2024-04-11 DIAGNOSIS — M5416 Radiculopathy, lumbar region: Secondary | ICD-10-CM

## 2024-04-11 DIAGNOSIS — B9689 Other specified bacterial agents as the cause of diseases classified elsewhere: Secondary | ICD-10-CM

## 2024-04-13 NOTE — Telephone Encounter (Signed)
 Last OV 02/04/24 Next OV 05/30/24  Last refill(s) Lyrica  12/15/23 Qty #270/0  Tizanidine  - not on current med list

## 2024-04-14 ENCOUNTER — Other Ambulatory Visit: Payer: Self-pay | Admitting: Internal Medicine

## 2024-04-14 DIAGNOSIS — M5416 Radiculopathy, lumbar region: Secondary | ICD-10-CM

## 2024-04-14 DIAGNOSIS — G8929 Other chronic pain: Secondary | ICD-10-CM

## 2024-04-18 ENCOUNTER — Ambulatory Visit: Admitting: Family Medicine

## 2024-04-18 VITALS — BP 120/80 | HR 81 | Temp 98.1°F | Ht 61.0 in | Wt 137.8 lb

## 2024-04-18 DIAGNOSIS — Z0000 Encounter for general adult medical examination without abnormal findings: Secondary | ICD-10-CM | POA: Diagnosis not present

## 2024-04-18 DIAGNOSIS — E1140 Type 2 diabetes mellitus with diabetic neuropathy, unspecified: Secondary | ICD-10-CM | POA: Insufficient documentation

## 2024-04-18 DIAGNOSIS — M62838 Other muscle spasm: Secondary | ICD-10-CM | POA: Diagnosis not present

## 2024-04-18 DIAGNOSIS — Z79899 Other long term (current) drug therapy: Secondary | ICD-10-CM | POA: Diagnosis not present

## 2024-04-18 DIAGNOSIS — E785 Hyperlipidemia, unspecified: Secondary | ICD-10-CM

## 2024-04-18 DIAGNOSIS — E559 Vitamin D deficiency, unspecified: Secondary | ICD-10-CM | POA: Diagnosis not present

## 2024-04-18 DIAGNOSIS — J4540 Moderate persistent asthma, uncomplicated: Secondary | ICD-10-CM | POA: Diagnosis not present

## 2024-04-18 DIAGNOSIS — R000 Tachycardia, unspecified: Secondary | ICD-10-CM | POA: Diagnosis not present

## 2024-04-18 DIAGNOSIS — I10 Essential (primary) hypertension: Secondary | ICD-10-CM

## 2024-04-18 LAB — CBC WITH DIFFERENTIAL/PLATELET
Basophils Absolute: 0 K/uL (ref 0.0–0.1)
Basophils Relative: 0.2 % (ref 0.0–3.0)
Eosinophils Absolute: 0.1 K/uL (ref 0.0–0.7)
Eosinophils Relative: 1.4 % (ref 0.0–5.0)
HCT: 39.3 % (ref 36.0–46.0)
Hemoglobin: 13.2 g/dL (ref 12.0–15.0)
Lymphocytes Relative: 38.2 % (ref 12.0–46.0)
Lymphs Abs: 1.9 K/uL (ref 0.7–4.0)
MCHC: 33.7 g/dL (ref 30.0–36.0)
MCV: 93.7 fl (ref 78.0–100.0)
Monocytes Absolute: 0.4 K/uL (ref 0.1–1.0)
Monocytes Relative: 7.9 % (ref 3.0–12.0)
Neutro Abs: 2.6 K/uL (ref 1.4–7.7)
Neutrophils Relative %: 52.3 % (ref 43.0–77.0)
Platelets: 246 K/uL (ref 150.0–400.0)
RBC: 4.19 Mil/uL (ref 3.87–5.11)
RDW: 12.8 % (ref 11.5–15.5)
WBC: 5 K/uL (ref 4.0–10.5)

## 2024-04-18 LAB — COMPREHENSIVE METABOLIC PANEL WITH GFR
ALT: 10 U/L (ref 0–35)
AST: 13 U/L (ref 0–37)
Albumin: 4 g/dL (ref 3.5–5.2)
Alkaline Phosphatase: 88 U/L (ref 39–117)
BUN: 11 mg/dL (ref 6–23)
CO2: 30 meq/L (ref 19–32)
Calcium: 9.2 mg/dL (ref 8.4–10.5)
Chloride: 103 meq/L (ref 96–112)
Creatinine, Ser: 0.53 mg/dL (ref 0.40–1.20)
GFR: 93.85 mL/min (ref >60.00)
Glucose, Bld: 99 mg/dL (ref 70–99)
Potassium: 3.2 meq/L — ABNORMAL LOW (ref 3.5–5.1)
Sodium: 142 meq/L (ref 135–145)
Total Bilirubin: 0.4 mg/dL (ref 0.2–1.2)
Total Protein: 7 g/dL (ref 6.0–8.3)

## 2024-04-18 LAB — VITAMIN D 25 HYDROXY (VIT D DEFICIENCY, FRACTURES): VITD: 25.28 ng/mL — ABNORMAL LOW (ref 30.00–100.00)

## 2024-04-18 LAB — LIPID PANEL
Cholesterol: 270 mg/dL — ABNORMAL HIGH (ref 0–200)
HDL: 42.7 mg/dL (ref >39.00)
LDL Cholesterol: 204 mg/dL — ABNORMAL HIGH (ref 0–99)
NonHDL: 227.52
Total CHOL/HDL Ratio: 6
Triglycerides: 117 mg/dL (ref 0.0–149.0)
VLDL: 23.4 mg/dL (ref 0.0–40.0)

## 2024-04-18 LAB — HEMOGLOBIN A1C: Hgb A1c MFr Bld: 5.8 % (ref 4.6–6.5)

## 2024-04-18 LAB — MICROALBUMIN / CREATININE URINE RATIO
Creatinine,U: 210 mg/dL
Microalb Creat Ratio: 7.5 mg/g (ref 0.0–30.0)
Microalb, Ur: 1.6 mg/dL (ref 0.0–1.9)

## 2024-04-18 MED ORDER — PREGABALIN 25 MG PO CAPS: 25.0000 mg | ORAL_CAPSULE | Freq: Three times a day (TID) | ORAL | 0 refills | Status: DC

## 2024-04-18 MED ORDER — EZETIMIBE 10 MG PO TABS: 10.0000 mg | ORAL_TABLET | Freq: Every day | ORAL | 0 refills | Status: AC

## 2024-04-18 MED ORDER — MONTELUKAST SODIUM 10 MG PO TABS
10.0000 mg | ORAL_TABLET | Freq: Every day | ORAL | 0 refills | Status: AC
Start: 2024-04-18 — End: ?

## 2024-04-18 MED ORDER — SPIRONOLACTONE 50 MG PO TABS: 50.0000 mg | ORAL_TABLET | Freq: Every day | ORAL | 0 refills | Status: DC

## 2024-04-18 MED ORDER — NEBIVOLOL HCL 5 MG PO TABS: 5.0000 mg | ORAL_TABLET | Freq: Every day | ORAL | 0 refills | Status: AC

## 2024-04-18 MED ORDER — CYCLOBENZAPRINE HCL 5 MG PO TABS: 5.0000 mg | ORAL_TABLET | Freq: Three times a day (TID) | ORAL | 1 refills | Status: DC | PRN

## 2024-04-18 MED ORDER — ROSUVASTATIN CALCIUM 20 MG PO TABS: 20.0000 mg | ORAL_TABLET | Freq: Every day | ORAL | 0 refills | Status: DC

## 2024-04-18 NOTE — Progress Notes (Signed)
 Established Patient Office Visit  Subjective:     Patient ID: Julie Rivas, female    DOB: May 02, 1956, 68 y.o.   MRN: [id number]  No chief complaint on file.   HPI  Discussed the use of AI scribe software for clinical note transcription with the patient, who gave verbal consent to proceed.  History of Present Illness Julie Rivas is a 68 year old female who presents with back spasms and pain.  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, resulting in decreased income - Financial challenges, including difficulty affording groceries, necessitate continued work despite symptoms  Medication use and concerns - Has used flexeril  in the past, would like to try this again - Tizanidine  not as effective for her  Vaccines - Consider shingles vaccine and flu vaccine once they are available  Screenings - Due for eye exam, scheduled for Tues next week     ROS Per HPI      Objective:    BP 120/80 (BP Location: Left Arm, Patient Position: Sitting)   Pulse 81   Temp 98.1 F (36.7 C) (Temporal)   Ht 5' 1 (1.549 m)   Wt 137 lb 12.8 oz (62.5 kg)   SpO2 96%   BMI 26.04 kg/m    Physical Exam Vitals and nursing note reviewed.  Constitutional:      General: She is not in acute distress.    Appearance: Normal appearance. She is normal weight.  HENT:     Head: Normocephalic and atraumatic.     Right Ear: External ear normal.     Left Ear: External ear normal.     Nose: Nose normal.     Mouth/Throat:      Mouth: Mucous membranes are moist.     Pharynx: Oropharynx is clear.  Eyes:     Extraocular Movements: Extraocular movements intact.     Pupils: Pupils are equal, round, and reactive to light.  Cardiovascular:     Rate and Rhythm: Normal rate and regular rhythm.     Pulses: Normal pulses.     Heart sounds: Normal heart sounds.  Pulmonary:     Effort: Pulmonary effort is normal. No respiratory distress.     Breath sounds: Normal breath sounds. No wheezing, rhonchi or rales.  Musculoskeletal:        General: Swelling present.     Cervical back: Normal range of motion.     Right lower leg: No edema.     Left lower leg: No edema.     Comments: LROM to low back with sitting, standing, changing positions. Paraspinous muscles mildly swollen, tender, in spasm. No erythema, no bruising, no obvious deformity  Lymphadenopathy:     Cervical: No cervical adenopathy.  Neurological:     General: No focal deficit present.     Mental Status: She is alert and oriented to person, place, and time.  Psychiatric:        Mood and Affect: Mood normal.        Thought Content: Thought content normal.     No results found for any  visits on 04/18/24.  The 10-year ASCVD risk score (Arnett DK, et al., 2019) is: 30.5%  BP Readings from Last 3 Encounters:  04/18/24 120/80  02/04/24 110/68  01/12/24 124/82   Wt Readings from Last 3 Encounters:  04/18/24 137 lb 12.8 oz (62.5 kg)  02/04/24 140 lb 9.6 oz (63.8 kg)  01/12/24 144 lb (65.3 kg)      Last CBC Lab Results  Component Value Date   WBC 6.3 02/04/2024   HGB 14.0 02/04/2024   HCT 41.4 02/04/2024   MCV 93.3 02/04/2024   MCH 31.7 08/14/2019   RDW 13.5 02/04/2024   PLT 232.0 02/04/2024   Last metabolic panel Lab Results  Component Value Date   GLUCOSE 76 02/04/2024   NA 144 02/04/2024   K 3.0 (L) 02/04/2024   CL 104 02/04/2024   CO2 30 02/04/2024   BUN 15 02/04/2024   CREATININE 0.73 02/04/2024   GFR 83.57 02/04/2024   CALCIUM  9.5  02/04/2024   PROT 7.1 02/04/2024   ALBUMIN 4.2 02/04/2024   BILITOT 0.4 02/04/2024   ALKPHOS 92 02/04/2024   AST 18 02/04/2024   ALT 21 02/04/2024   ANIONGAP 8 08/14/2019   Last lipids Lab Results  Component Value Date   CHOL 279 (H) 11/01/2023   HDL 49.40 11/01/2023   LDLCALC 201 (H) 11/01/2023   TRIG 141.0 11/01/2023   CHOLHDL 6 11/01/2023   Last hemoglobin A1c Lab Results  Component Value Date   HGBA1C 6.4 11/01/2023   Last thyroid  functions Lab Results  Component Value Date   TSH 0.87 11/01/2023   T4TOTAL 9.0 02/18/2017   Last vitamin D  Lab Results  Component Value Date   VD25OH 15.03 (L) 02/04/2024   Last vitamin B12 and Folate Lab Results  Component Value Date   VITAMINB12 764 11/01/2023   FOLATE 16.7 11/01/2023         Assessment & Plan:   Assessment and Plan Assessment & Plan Adult Wellness Visit Annual wellness visit scheduled for September 23rd. Emphasized importance of regular visits for comprehensive care.  Asthma - Stable, refilled singulair   HTN - stable, CBC, CMP today - refilled nebivolol   HLD - lipids today - refilled zetia   Vit D Def - vit D today  Back muscle spasm and pain Intermittent back spasms and pain, less severe. Switched to cyclobenzaprine . Arthritis may contribute to pain. Aware of potential drowsiness. - Prescribe cyclobenzaprine  (Flexeril ) for back spasms. - Advise on potential drowsiness with cyclobenzaprine . - Encourage use of wrist brace during sleep.  General Health Maintenance Discussed shingles vaccine, 95% protection, covered by Medicare at pharmacy. Discussed need for flu shot, not yet available. - Consider receiving shingles vaccine at pharmacy. - Plan to receive flu shot when available.     Orders Placed This Encounter  Procedures   CBC with Differential/Platelet    Release to patient:   Immediate [1]   Comprehensive metabolic panel with GFR    Release to patient:   Immediate [1]   Hemoglobin  A1c   Lipid panel   Microalbumin / creatinine urine ratio    Release to patient:   Immediate   VITAMIN D  25 Hydroxy (Vit-D Deficiency, Fractures)     Meds ordered this encounter  Medications   pregabalin  (LYRICA ) 25 MG capsule    Sig: Take 1 capsule (25 mg total) by mouth 3 (three) times daily.    Dispense:  270 capsule    Refill:  0   nebivolol  (BYSTOLIC ) 5 MG tablet  Sig: Take 1 tablet (5 mg total) by mouth daily.    Dispense:  90 tablet    Refill:  0   rosuvastatin  (CRESTOR ) 20 MG tablet    Sig: Take 1 tablet (20 mg total) by mouth daily.    Dispense:  90 tablet    Refill:  0   spironolactone  (ALDACTONE ) 50 MG tablet    Sig: Take 1 tablet (50 mg total) by mouth daily.    Dispense:  30 tablet    Refill:  0   montelukast  (SINGULAIR ) 10 MG tablet    Sig: Take 1 tablet (10 mg total) by mouth at bedtime.    Dispense:  90 tablet    Refill:  0   ezetimibe  (ZETIA ) 10 MG tablet    Sig: Take 1 tablet (10 mg total) by mouth daily.    Dispense:  90 tablet    Refill:  0   cyclobenzaprine  (FLEXERIL ) 5 MG tablet    Sig: Take 1 tablet (5 mg total) by mouth 3 (three) times daily as needed for muscle spasms.    Dispense:  30 tablet    Refill:  1    Return in about 6 months (around 10/19/2024) for meds.  Corean LITTIE Ku, FNP

## 2024-04-18 NOTE — Patient Instructions (Addendum)
 We have completed your physical today.  Please send us  a copy of your eye exam next week once it is completed.   I have sent in refills for you today.   We are checking labs today, will be in contact with any results that require further attention  Follow up with me in about 3 months for labs and medication management, sooner if needed.

## 2024-04-19 ENCOUNTER — Ambulatory Visit: Payer: Self-pay | Admitting: Family Medicine

## 2024-04-19 DIAGNOSIS — E785 Hyperlipidemia, unspecified: Secondary | ICD-10-CM

## 2024-04-19 DIAGNOSIS — E559 Vitamin D deficiency, unspecified: Secondary | ICD-10-CM

## 2024-04-19 DIAGNOSIS — J4541 Moderate persistent asthma with (acute) exacerbation: Secondary | ICD-10-CM | POA: Diagnosis not present

## 2024-04-19 MED ORDER — ROSUVASTATIN CALCIUM 40 MG PO TABS: 40.0000 mg | ORAL_TABLET | Freq: Every day | ORAL | 1 refills | Status: DC

## 2024-04-19 MED ORDER — VITAMIN D (ERGOCALCIFEROL) 1.25 MG (50000 UNIT) PO CAPS: 50000.0000 [IU] | ORAL_CAPSULE | ORAL | 0 refills | Status: AC

## 2024-04-27 ENCOUNTER — Encounter: Payer: Self-pay | Admitting: Internal Medicine

## 2024-04-27 LAB — HM DIABETES EYE EXAM

## 2024-05-12 ENCOUNTER — Ambulatory Visit: Payer: Self-pay

## 2024-05-12 NOTE — Telephone Encounter (Signed)
 FYI Only or Action Required?: FYI only for provider.  Patient was last seen in primary care on 04/18/2024 by Alvia Corean CROME, FNP.  Called Nurse Triage reporting hand swelling.  Symptoms began several weeks ago.  Interventions attempted: Nothing.  Symptoms are: unchanged.  Triage Disposition: See PCP When Office is Open (Within 3 Days)  Patient/caregiver understands and will follow disposition?: Yes   Copied from CRM [id number]. Topic: Clinical - Red Word Triage >> May 12, 2024  9:33 AM [place] A wrote: [facility] that prompted transfer to Nurse Triage: Patient has pain in her left hip and swelling in her hands. Reason for Disposition  [1] MODERATE pain (e.g., interferes with normal activities) AND [2] present > 3 days  Answer Assessment - Initial Assessment Questions 1. ONSET: When did the pain start?     2 weeks 2. LOCATION: Where is the pain located?     Bilateral hands, states numbness and swollen  3. PAIN: How bad is the pain? (Scale 1-10; or mild, moderate, severe)   - MILD (1-3): doesn't interfere with normal activities   - MODERATE (4-7): interferes with normal activities (e.g., work or school) or awakens from sleep   - SEVERE (8-10): excruciating pain, unable to use hand at all     moderate 4. WORK OR EXERCISE: Has there been any recent work or exercise that involved this part (i.e., hand or wrist) of the body?     no 5. CAUSE: What do you think is causing the pain?     neuropathy 6. AGGRAVATING FACTORS: What makes the pain worse? (e.g., using computer)     When she goes to bed they hurt. 7. OTHER SYMPTOMS: Do you have any other symptoms? (e.g., neck pain, swelling, rash, numbness, fever)     Hands swollen,  8. PREGNANCY: Is there any chance you are pregnant? When was your last menstrual period?     na  Protocols used: Hand and Wrist Pain-A-AH

## 2024-05-15 ENCOUNTER — Encounter: Payer: Self-pay | Admitting: Family Medicine

## 2024-05-15 ENCOUNTER — Ambulatory Visit (INDEPENDENT_AMBULATORY_CARE_PROVIDER_SITE_OTHER): Admitting: Family Medicine

## 2024-05-15 VITALS — BP 102/76 | HR 91 | Temp 98.0°F | Ht 61.0 in | Wt 141.6 lb

## 2024-05-15 DIAGNOSIS — M79641 Pain in right hand: Secondary | ICD-10-CM

## 2024-05-15 DIAGNOSIS — E1140 Type 2 diabetes mellitus with diabetic neuropathy, unspecified: Secondary | ICD-10-CM | POA: Diagnosis not present

## 2024-05-15 DIAGNOSIS — M48061 Spinal stenosis, lumbar region without neurogenic claudication: Secondary | ICD-10-CM | POA: Diagnosis not present

## 2024-05-15 DIAGNOSIS — M79642 Pain in left hand: Secondary | ICD-10-CM | POA: Diagnosis not present

## 2024-05-15 DIAGNOSIS — G8929 Other chronic pain: Secondary | ICD-10-CM

## 2024-05-15 DIAGNOSIS — M2550 Pain in unspecified joint: Secondary | ICD-10-CM

## 2024-05-15 DIAGNOSIS — I10 Essential (primary) hypertension: Secondary | ICD-10-CM | POA: Diagnosis not present

## 2024-05-15 DIAGNOSIS — E785 Hyperlipidemia, unspecified: Secondary | ICD-10-CM

## 2024-05-15 DIAGNOSIS — G5603 Carpal tunnel syndrome, bilateral upper limbs: Secondary | ICD-10-CM

## 2024-05-15 DIAGNOSIS — E876 Hypokalemia: Secondary | ICD-10-CM | POA: Insufficient documentation

## 2024-05-15 DIAGNOSIS — M5416 Radiculopathy, lumbar region: Secondary | ICD-10-CM

## 2024-05-15 LAB — COMPREHENSIVE METABOLIC PANEL WITH GFR
ALT: 16 U/L (ref 0–35)
AST: 19 U/L (ref 0–37)
Albumin: 4.1 g/dL (ref 3.5–5.2)
Alkaline Phosphatase: 96 U/L (ref 39–117)
BUN: 10 mg/dL (ref 6–23)
CO2: 33 meq/L — ABNORMAL HIGH (ref 19–32)
Calcium: 9.7 mg/dL (ref 8.4–10.5)
Chloride: 103 meq/L (ref 96–112)
Creatinine, Ser: 0.67 mg/dL (ref 0.40–1.20)
GFR: 88.65 mL/min (ref >60.00)
Glucose, Bld: 88 mg/dL (ref 70–99)
Potassium: 3.2 meq/L — ABNORMAL LOW (ref 3.5–5.1)
Sodium: 144 meq/L (ref 135–145)
Total Bilirubin: 0.4 mg/dL (ref 0.2–1.2)
Total Protein: 7.2 g/dL (ref 6.0–8.3)

## 2024-05-15 LAB — URIC ACID: Uric Acid, Serum: 6.1 mg/dL (ref 2.4–7.0)

## 2024-05-15 LAB — SEDIMENTATION RATE: Sed Rate: 25 mm/h (ref 0–30)

## 2024-05-15 LAB — C-REACTIVE PROTEIN: CRP: <1 mg/dL (ref 0.5–20.0)

## 2024-05-15 MED ORDER — PREDNISONE 20 MG PO TABS: 40.0000 mg | ORAL_TABLET | Freq: Every day | ORAL | 0 refills | Status: DC

## 2024-05-15 MED ORDER — CYCLOBENZAPRINE HCL 10 MG PO TABS: 10.0000 mg | ORAL_TABLET | Freq: Three times a day (TID) | ORAL | 0 refills | Status: DC | PRN

## 2024-05-15 MED ORDER — KETOROLAC TROMETHAMINE 60 MG/2ML IM SOLN
60.0000 mg | Freq: Once | INTRAMUSCULAR | Status: AC
  Administered 2024-05-15: 60 mg via INTRAMUSCULAR

## 2024-05-15 MED ORDER — TRAMADOL HCL 50 MG PO TABS: 50.0000 mg | ORAL_TABLET | Freq: Three times a day (TID) | ORAL | 0 refills | Status: AC | PRN

## 2024-05-15 NOTE — Progress Notes (Unsigned)
 Acute Office Visit  Subjective:     Patient ID: Julie Rivas, female    DOB: Jan 20, 1956, 68 y.o.   MRN: [id number]  Chief Complaint  Patient presents with   Numbness    Bilateral arms and legs, does have neuropathy but thinks she may have carpal tunnel in the hands   Hip Pain    Pain from lower back has moved down to L hip, goes down leg. Describes pain as stinging and burning, has had a recent fall    HPI  Discussed the use of AI scribe software for clinical note transcription with the patient, who gave verbal consent to proceed.  History of Present Illness Julie Rivas is a 68 year old female with neuropathy who presents with worsening pain and swelling in her right ankle and hip.  Right lower extremity pain and swelling - Sharp, burning, stinging pain in the right ankle radiating from the hip - Progressive worsening of pain - Inability to bear weight on the right foot due to pain - Swelling present in the right foot and ankle, with asymmetry compared to the left side - No specific injury or trauma identified - Recent travel without significant trauma - Flexeril  ineffective for symptom relief - Partial relief with hydrocortisone cream, muscle cream, and alternating heat and cold therapy  Upper extremity neuropathy and swelling - Numbness and tingling in the fingertips, primarily affecting the right hand - Swelling in the hands upon waking - Numbness and tingling mainly in the thumb and first two fingers of the right hand - Pain occasionally radiates up the right arm - Wears a brace for support - Impaired ability to perform work-related tasks due to pain and swelling in hands and hip  Family history of autoimmune joint disease - Family history of rheumatoid arthritis in mother and aunt with significant joint issues - No prior testing for rheumatoid factor     ROS Per HPI      Objective:    BP 102/76 (BP Location: Left Arm, Patient Position: Sitting)    Pulse 91   Temp 98 F (36.7 C) (Temporal)   Ht 5' 1 (1.549 m)   Wt 141 lb 9.6 oz (64.2 kg)   SpO2 98%   BMI 26.76 kg/m    Physical Exam Vitals and nursing note reviewed.  Constitutional:      General: She is not in acute distress.    Appearance: Normal appearance. She is normal weight.     Comments: Appears uncomfortable  HENT:     Head: Normocephalic and atraumatic.     Right Ear: External ear normal.     Left Ear: External ear normal.     Mouth/Throat:     Pharynx: Oropharynx is clear.  Eyes:     Extraocular Movements: Extraocular movements intact.  Cardiovascular:     Rate and Rhythm: Normal rate and regular rhythm.     Pulses: Normal pulses.     Heart sounds: Normal heart sounds.  Pulmonary:     Effort: Pulmonary effort is normal. No respiratory distress.     Breath sounds: Normal breath sounds. No wheezing, rhonchi or rales.  Musculoskeletal:     Cervical back: Normal range of motion.     Right lower leg: No edema.     Left lower leg: No edema.     Comments: LROM to low back, L hip, L ankle especially with weight bearing and position changes. Paraspinous muscles mildly swollen, tender, in spasm. No bruising,  no erythema, no obvious deformity. + Phalen's, + Tinel sign bilat  Lymphadenopathy:     Cervical: No cervical adenopathy.  Neurological:     General: No focal deficit present.     Mental Status: She is alert and oriented to person, place, and time.  Psychiatric:        Mood and Affect: Mood normal.        Thought Content: Thought content normal.     Results for orders placed or performed in visit on 05/15/24  Comp Met (CMET)  Result Value Ref Range   Sodium 144 135 - 145 mEq/L   Potassium 3.2 (L) 3.5 - 5.1 mEq/L   Chloride 103 96 - 112 mEq/L   CO2 33 (H) 19 - 32 mEq/L   Glucose, Bld 88 70 - 99 mg/dL   BUN 10 6 - 23 mg/dL   Creatinine, Ser 9.32 0.40 - 1.20 mg/dL   Total Bilirubin 0.4 0.2 - 1.2 mg/dL   Alkaline Phosphatase 96 39 - 117 U/L   AST 19 0  - 37 U/L   ALT 16 0 - 35 U/L   Total Protein 7.2 6.0 - 8.3 g/dL   Albumin 4.1 3.5 - 5.2 g/dL   GFR 11.34 >39.99 mL/min   Calcium  9.7 8.4 - 10.5 mg/dL  Uric acid  Result Value Ref Range   Uric Acid, Serum 6.1 2.4 - 7.0 mg/dL  ANA,IFA RA Diag Pnl w/rflx Tit/Patn  Result Value Ref Range   Anti Nuclear Antibody (ANA) NEGATIVE NEGATIVE   Rheumatoid fact SerPl-aCnc <10 <14 IU/mL   Cyclic Citrullin Peptide Ab <83 UNITS   INTERPRETATION    C-reactive protein  Result Value Ref Range   CRP <1.0 0.5 - 20.0 mg/dL  Sedimentation rate  Result Value Ref Range   Sed Rate 25 0 - 30 mm/hr        Assessment & Plan:   Assessment and Plan Assessment & Plan Left Hip, Left Ankle Pain, Lumbar radiculopathy, Neuroforaminal Stenosis of Lumbar Spine Sharp, burning pain in hip and ankle with swelling. Inflammation suspected. Prednisone  chosen for anti-inflammatory effect. Tramadol  selected for pain management. - Prescribe prednisone  40 mg for 5 days. - Increase Flexeril  to 10 mg. - Prescribe tramadol  for pain. - Administer Toradol  injection.  Hand Pain and Numbness, Bilateral Carpal Tunnel Syndrome Numbness and tingling in fingertips, primarily right hand. Neuropathy suspected. Family history of rheumatoid arthritis. Blood tests planned. - Order blood tests: rheumatoid factor, ANA, inflammation labs. - Check uric acid levels.  Hypertension, Diabetic Neuropathy Blood pressure and blood sugar well-controlled on current medications.  Follow-up Follow-up advised after lab results to determine next management steps. - Review lab results and determine need for specialist referral.     Orders Placed This Encounter  Procedures   Comp Met (CMET)   Uric acid   Rheumatoid Factor   ANA,IFA RA Diag Pnl w/rflx Tit/Patn   C-reactive protein   Sedimentation rate     Meds ordered this encounter  Medications   predniSONE  (DELTASONE ) 20 MG tablet    Sig: Take 2 tablets (40 mg total) by mouth daily  for 5 days.    Dispense:  10 tablet    Refill:  0   cyclobenzaprine  (FLEXERIL ) 10 MG tablet    Sig: Take 1 tablet (10 mg total) by mouth 3 (three) times daily as needed for muscle spasms.    Dispense:  30 tablet    Refill:  0   traMADol  (ULTRAM ) 50 MG tablet  Sig: Take 1 tablet (50 mg total) by mouth every 8 (eight) hours as needed for up to 5 days.    Dispense:  15 tablet    Refill:  0   ketorolac  (TORADOL ) injection 60 mg    Return if symptoms worsen or fail to improve.  Corean LITTIE Ku, FNP

## 2024-05-15 NOTE — Patient Instructions (Addendum)
 May continue heat and ice as needed.  I have sent in prednisone  for you to take 2 tablets once daily in the morning with breakfast for the next 5 days.  I have increased your cyclobenzaprine  dose to 10mg  up to three times per day as needed for muscle spasms and pain.  I have sent in tramadol  for you to take one tablet as needed every 6 hours as needed for moderate to severe pain.  You have received an injection of Toradol  in office today for pain.  Do not take any NSAIDs including aspirin, Aleve, ibuprofen, Celebrex, meloxicam  within the next 6 hours after your injection.  We are checking labs today, will be in contact with any results that require further attention.  Follow-up with me for new or worsening symptoms.

## 2024-05-16 LAB — ANA,IFA RA DIAG PNL W/RFLX TIT/PATN
Anti Nuclear Antibody (ANA): NEGATIVE
Cyclic Citrullin Peptide Ab: <16 U
Rheumatoid fact SerPl-aCnc: <10 [IU]/mL (ref <14)

## 2024-05-18 ENCOUNTER — Ambulatory Visit: Payer: Self-pay | Admitting: Family Medicine

## 2024-05-20 DIAGNOSIS — J4541 Moderate persistent asthma with (acute) exacerbation: Secondary | ICD-10-CM | POA: Diagnosis not present

## 2024-05-21 DIAGNOSIS — M79641 Pain in right hand: Secondary | ICD-10-CM | POA: Insufficient documentation

## 2024-05-30 ENCOUNTER — Ambulatory Visit (INDEPENDENT_AMBULATORY_CARE_PROVIDER_SITE_OTHER): Payer: PPO

## 2024-05-30 VITALS — Ht 61.0 in | Wt 139.0 lb

## 2024-05-30 DIAGNOSIS — Z1231 Encounter for screening mammogram for malignant neoplasm of breast: Secondary | ICD-10-CM

## 2024-05-30 DIAGNOSIS — Z0000 Encounter for general adult medical examination without abnormal findings: Secondary | ICD-10-CM

## 2024-05-30 NOTE — Progress Notes (Signed)
 Subjective:   Julie Rivas is a 68 y.o. who presents for a Medicare Wellness preventive visit.  As a reminder, Annual Wellness Visits don't include a physical exam, and some assessments may be limited, especially if this visit is performed virtually. We may recommend an in-person follow-up visit with your provider if needed.  Visit Complete: Virtual I connected with  Julie Rivas on 05/30/24 by a audio enabled telemedicine application and verified that I am speaking with the correct person using two identifiers.  Patient Location: Home  Provider Location: Office/Clinic  I discussed the limitations of evaluation and management by telemedicine. The patient expressed understanding and agreed to proceed.  Vital Signs: Because this visit was a virtual/telehealth visit, some criteria may be missing or patient reported. Any vitals not documented were not able to be obtained and vitals that have been documented are patient reported.  VideoDeclined- This patient declined Librarian, academic. Therefore the visit was completed with audio only.  Persons Participating in Visit: Patient.  AWV Questionnaire: No: Patient Medicare AWV questionnaire was not completed prior to this visit.  Cardiac Risk Factors include: advanced age (>3men, >43 women);diabetes mellitus;dyslipidemia;hypertension     Objective:    Today's Vitals   05/30/24 1008  Weight: 139 lb (63 kg)  Height: 5' 1 (1.549 m)   Body mass index is 26.26 kg/m.     05/30/2024   10:08 AM 05/27/2023   10:00 AM 06/10/2022   11:02 AM  Advanced Directives  Does Patient Have a Medical Advance Directive? Yes Yes No  Type of Estate agent of [place];Living will Healthcare Power of [place];Living will   Copy of Healthcare Power of Attorney in Chart? No - copy requested No - copy requested   Would patient like information on creating a medical advance directive?   No - Patient  declined    Current Medications (verified) Outpatient Encounter Medications as of 05/30/2024  Medication Sig   albuterol  (PROVENTIL ) (2.5 MG/3ML) 0.083% nebulizer solution USE 1 VIAL IN NEBULIZER EVERY 6 HOURS AS NEEDED FOR WHEEZING OR SHORTNESS OF BREATH   blood glucose meter kit and supplies Dispense based on patient and insurance preference. Use up to four times daily as directed. (FOR ICD-10 E10.9, E11.9).   budesonide  (PULMICORT ) 0.5 MG/2ML nebulizer solution Take 2 mLs (0.5 mg total) by nebulization 2 (two) times daily.   clonazePAM  (KLONOPIN ) 0.5 MG tablet Take 1 tablet (0.5 mg total) by mouth 2 (two) times daily as needed for anxiety.   cyclobenzaprine  (FLEXERIL ) 10 MG tablet Take 1 tablet (10 mg total) by mouth 3 (three) times daily as needed for muscle spasms.   ezetimibe  (ZETIA ) 10 MG tablet Take 1 tablet (10 mg total) by mouth daily.   fluticasone -salmeterol (WIXELA INHUB) 250-50 MCG/ACT AEPB Inhale 1 puff into the lungs in the morning and at bedtime.   Lancets (ONETOUCH DELICA PLUS LANCET33G) MISC Place 1 Act onto the skin 4 (four) times daily as needed.   metoprolol  tartrate (LOPRESSOR ) 100 MG tablet Take tablet (100mg ) TWO hours prior to your cardiac CT scan.   montelukast  (SINGULAIR ) 10 MG tablet Take 1 tablet (10 mg total) by mouth at bedtime.   nebivolol  (BYSTOLIC ) 5 MG tablet Take 1 tablet (5 mg total) by mouth daily.   Nebulizer MISC by Does not apply route.   ONETOUCH ULTRA test strip USE AS DIRECTED UP TO  4 TIMES DAILY TO CHECK BLOOD SUGARS   potassium chloride  (KLOR-CON ) 10 MEQ tablet TAKE  1 TABLET BY MOUTH THREE TIMES DAILY (Patient taking differently: Take 10 mEq by mouth 2 (two) times daily.)   pregabalin  (LYRICA ) 25 MG capsule Take 1 capsule (25 mg total) by mouth 3 (three) times daily.   rosuvastatin  (CRESTOR ) 40 MG tablet Take 1 tablet (40 mg total) by mouth daily.   spironolactone  (ALDACTONE ) 50 MG tablet Take 1 tablet (50 mg total) by mouth daily.   Vitamin D ,  Ergocalciferol , (DRISDOL ) 1.25 MG (50000 UNIT) CAPS capsule Take 1 capsule (50,000 Units total) by mouth every 7 (seven) days.   predniSONE  (DELTASONE ) 20 MG tablet Take 2 tablets (40 mg total) by mouth daily for 5 days. (Patient not taking: Reported on 05/30/2024)   No facility-administered encounter medications on file as of 05/30/2024.    Allergies (verified) Codeine and Metformin  and related   History: Past Medical History:  Diagnosis Date   Allergy    Anxiety    Arthritis    Asthma    Cataract    REMOVED BOTH EYES    Hyperlipidemia    Hypertension    Past Surgical History:  Procedure Laterality Date   COLONOSCOPY     ECTOPIC PREGNANCY SURGERY     has partial right fallopian tube   POLYPECTOMY     Family History  Problem Relation Age of Onset   Heart failure Mother    Arthritis Mother    Cancer Father    Hypertension Father    Diabetes Sister    Colon cancer Neg Hx    Esophageal cancer Neg Hx    Stomach cancer Neg Hx    Rectal cancer Neg Hx    Hyperlipidemia Neg Hx    Heart disease Neg Hx    Early death Neg Hx    Depression Neg Hx    Colon polyps Neg Hx    Social History   Socioeconomic History   Marital status: Married    Spouse name: Not on file   Number of children: Not on file   Years of education: Not on file   Highest education level: Associate degree: occupational, Scientist, product/process development, or vocational program  Occupational History   Not on file  Tobacco Use   Smoking status: Never   Smokeless tobacco: Never  Vaping Use   Vaping status: Never Used  Substance and Sexual Activity   Alcohol use: No   Drug use: No   Sexual activity: Yes    Birth control/protection: Surgical  Other Topics Concern   Not on file  Social History Narrative   Not on file   Social Drivers of Health   Financial Resource Strain: Low Risk  (05/30/2024)   Overall Financial Resource Strain (CARDIA)    Difficulty of Paying Living Expenses: Not hard at all  Food Insecurity: No Food  Insecurity (05/30/2024)   Hunger Vital Sign    Worried About Running Out of Food in the Last Year: Never true    Ran Out of Food in the Last Year: Never true  Transportation Needs: No Transportation Needs (05/30/2024)   PRAPARE - Administrator, Civil Service (Medical): No    Lack of Transportation (Non-Medical): No  Physical Activity: Insufficiently Active (05/30/2024)   Exercise Vital Sign    Days of Exercise per Week: 2 days    Minutes of Exercise per Session: 30 min  Stress: No Stress Concern Present (05/30/2024)   [employer] of Occupational Health - Occupational Stress Questionnaire    Feeling of Stress: Not at all  Social  Connections: Moderately Isolated (05/30/2024)   Social Connection and Isolation Panel    Frequency of Communication with Friends and Family: More than three times a week    Frequency of Social Gatherings with Friends and Family: Three times a week    Attends Religious Services: Never    Active Member of Clubs or Organizations: No    Attends Banker Meetings: Never    Marital Status: Married    Tobacco Counseling Counseling given: No    Clinical Intake:  Pre-visit preparation completed: Yes  Pain : No/denies pain     BMI - recorded: 26.26 Nutritional Status: BMI 25 -29 Overweight Nutritional Risks: None Diabetes: No  Lab Results  Component Value Date   HGBA1C 5.8 04/18/2024   HGBA1C 6.4 11/01/2023   HGBA1C 6.2 04/22/2023     How often do you need to have someone help you when you read instructions, pamphlets, or other written materials from your doctor or pharmacy?: 1 - Never  Interpreter Needed?: No  Information entered by :: Verdie Saba, CMA   Activities of Daily Living     05/30/2024   10:11 AM  In your present state of health, do you have any difficulty performing the following activities:  Hearing? 0  Vision? 0  Difficulty concentrating or making decisions? 0  Walking or climbing stairs? 0   Dressing or bathing? 0  Doing errands, shopping? 0  Preparing Food and eating ? N  Using the Toilet? N  In the past six months, have you accidently leaked urine? N  Do you have problems with loss of bowel control? N  Managing your Medications? N  Managing your Finances? N  Housekeeping or managing your Housekeeping? N    Patient Care Team: Alvia Corean CROME, FNP as PCP - General (Family Medicine)  I have updated your Care Teams any recent Medical Services you may have received from other providers in the past year.     Assessment:   This is a routine wellness examination for Julie Rivas.  Hearing/Vision screen Hearing Screening - Comments:: Denies hearing difficulties   Vision Screening - Comments:: Wears rx glasses - up to date with routine eye exams with an Ophthalmologist    Goals Addressed               This Visit's Progress     Patient Stated (pt-stated)        Patient stated she wants to maintain her weight       Depression Screen     05/30/2024   10:11 AM 09/13/2023    8:35 AM 05/27/2023    9:59 AM 03/01/2023    2:52 PM 10/21/2022    1:02 PM 06/10/2022   10:56 AM 12/25/2019    1:16 PM  PHQ 2/9 Scores  PHQ - 2 Score 0 0 0 0 0 0 0  PHQ- 9 Score 0          Fall Risk     05/30/2024   10:11 AM 08/27/2023   10:45 AM 05/27/2023   10:00 AM 03/01/2023    2:52 PM 10/21/2022    1:01 PM  Fall Risk   Falls in the past year? 0 0 0 0 0  Number falls in past yr: 0 0 0 0 0  Injury with Fall? 0 0 0 0 0  Risk for fall due to : No Fall Risks  No Fall Risks No Fall Risks No Fall Risks  Follow up Falls evaluation completed;Falls prevention discussed Falls  evaluation completed Falls prevention discussed Falls evaluation completed Falls evaluation completed    MEDICARE RISK AT HOME:  Medicare Risk at Home Any stairs in or around the home?: No If so, are there any without handrails?: No Home free of loose throw rugs in walkways, pet beds, electrical cords, etc?:  Yes Adequate lighting in your home to reduce risk of falls?: Yes Life alert?: No Use of a cane, walker or w/c?: No Grab bars in the bathroom?: No Shower chair or bench in shower?: Yes Elevated toilet seat or a handicapped toilet?: Yes  TIMED UP AND GO:  Was the test performed?  No  Cognitive Function: 6CIT completed        05/30/2024   10:14 AM 05/27/2023   10:00 AM 06/10/2022   11:02 AM  6CIT Screen  What Year? 0 points 0 points 0 points  What month? 0 points 0 points 0 points  What time? 0 points 0 points 0 points  Count back from 20 0 points 0 points 0 points  Months in reverse 0 points 0 points 0 points  Repeat phrase 0 points 0 points 0 points  Total Score 0 points 0 points 0 points    Immunizations Immunization History  Administered Date(s) Administered   Fluad Quad(high Dose 65+) 07/10/2020, 07/16/2021, 05/21/2022   Influenza,inj,Quad PF,6+ Mos 07/01/2016, 07/04/2018   PFIZER(Purple Top)SARS-COV-2 Vaccination 10/29/2019, 11/22/2019   Pneumococcal Conjugate-13 02/06/2020   Pneumococcal Polysaccharide-23 10/30/2015, 12/24/2021   Tdap 02/06/2020   Zoster, Live 08/07/2014    Screening Tests Health Maintenance  Topic Date Due   Influenza Vaccine  04/07/2024   Zoster Vaccines- Shingrix  (1 of 2) 04/18/2025 (Originally 02/06/1975)   Mammogram  09/03/2024   HEMOGLOBIN A1C  10/19/2024   Diabetic kidney evaluation - Urine ACR  04/18/2025   FOOT EXAM  04/18/2025   OPHTHALMOLOGY EXAM  04/27/2025   Diabetic kidney evaluation - eGFR measurement  05/15/2025   Medicare Annual Wellness (AWV)  05/30/2025   Colonoscopy  01/18/2028   DTaP/Tdap/Td (2 - Td or Tdap) 02/05/2030   Pneumococcal Vaccine: 50+ Years  Completed   DEXA SCAN  Completed   Hepatitis C Screening  Completed   HPV VACCINES  Aged Out   Meningococcal B Vaccine  Aged Out   COVID-19 Vaccine  Discontinued    Health Maintenance Items Addressed: 05/30/2024  Mammogram status: ordered today  Additional  Screening:  Vision Screening: Recommended annual ophthalmology exams for early detection of glaucoma and other disorders of the eye. Is the patient up to date with their annual eye exam?  Yes  Who is the provider or what is the name of the office in which the patient attends annual eye exams? Patient is unable to recall the name of her Ophthalmologist   Dental Screening: Recommended annual dental exams for proper oral hygiene  Community Resource Referral / Chronic Care Management: CRR required this visit?  No   CCM required this visit?  No   Plan:    I have personally reviewed and noted the following in the patient's chart:   Medical and social history Use of alcohol, tobacco or illicit drugs  Current medications and supplements including opioid prescriptions. Patient is not currently taking opioid prescriptions. Functional ability and status Nutritional status Physical activity Advanced directives List of other physicians Hospitalizations, surgeries, and ER visits in previous 12 months Vitals Screenings to include cognitive, depression, and falls Referrals and appointments  In addition, I have reviewed and discussed with patient certain preventive protocols, quality  metrics, and best practice recommendations. A written personalized care plan for preventive services as well as general preventive health recommendations were provided to patient.   Verdie CHRISTELLA Saba, CMA   05/30/2024   After Visit Summary: (MyChart) Due to this being a telephonic visit, the after visit summary with patients personalized plan was offered to patient via MyChart   Notes: Scheduled a 6-mth f/u w/PCP for 11/2024.

## 2024-05-30 NOTE — Patient Instructions (Addendum)
 Julie Rivas,  Thank you for taking the time for your Medicare Wellness Visit. I appreciate your continued commitment to your health goals. Please review the care plan we discussed, and feel free to reach out if I can assist you further.  Medicare recommends these wellness visits once per year to help you and your care team stay ahead of potential health issues. These visits are designed to focus on prevention, allowing your provider to concentrate on managing your acute and chronic conditions during your regular appointments.  Please note that Annual Wellness Visits do not include a physical exam. Some assessments may be limited, especially if the visit was conducted virtually. If needed, we may recommend a separate in-person follow-up with your provider.  Ongoing Care Seeing your primary care provider every 3 to 6 months helps us  monitor your health and provide consistent, personalized care.  Referrals If a referral was made during today's visit and you haven't received any updates within two weeks, please contact the referred provider directly to check on the status.  Recommended Screenings:  Health Maintenance  Topic Date Due   Flu Shot  04/07/2024   Zoster (Shingles) Vaccine (1 of 2) 04/18/2025*   Breast Cancer Screening  09/03/2024   Hemoglobin A1C  10/19/2024   Yearly kidney health urinalysis for diabetes  04/18/2025   Complete foot exam   04/18/2025   Eye exam for diabetics  04/27/2025   Yearly kidney function blood test for diabetes  05/15/2025   Medicare Annual Wellness Visit  05/30/2025   Colon Cancer Screening  01/18/2028   DTaP/Tdap/Td vaccine (2 - Td or Tdap) 02/05/2030   Pneumococcal Vaccine for age over 35  Completed   DEXA scan (bone density measurement)  Completed   Hepatitis C Screening  Completed   HPV Vaccine  Aged Out   Meningitis B Vaccine  Aged Out   COVID-19 Vaccine  Discontinued  *Topic was postponed. The date shown is not the original due date.        05/30/2024   10:08 AM  Advanced Directives  Does Patient Have a Medical Advance Directive? Yes  Type of Estate agent of [place];Living will  Copy of Healthcare Power of Attorney in Chart? No - copy requested   Advance Care Planning is important because it: Ensures you receive medical care that aligns with your values, goals, and preferences. Provides guidance to your family and loved ones, reducing the emotional burden of decision-making during critical moments.  Vision: Annual vision screenings are recommended for early detection of glaucoma, cataracts, and diabetic retinopathy. These exams can also reveal signs of chronic conditions such as diabetes and high blood pressure.  Dental: Annual dental screenings help detect early signs of oral cancer, gum disease, and other conditions linked to overall health, including heart disease and diabetes.

## 2024-06-01 ENCOUNTER — Ambulatory Visit

## 2024-06-01 ENCOUNTER — Ambulatory Visit
Admission: RE | Admit: 2024-06-01 | Discharge: 2024-06-01 | Disposition: A | Discharge status: Home / Self Care | Source: Ambulatory Visit | Attending: Family Medicine | Admitting: Family Medicine

## 2024-06-01 DIAGNOSIS — Z1231 Encounter for screening mammogram for malignant neoplasm of breast: Secondary | ICD-10-CM

## 2024-06-06 ENCOUNTER — Other Ambulatory Visit: Payer: Self-pay | Admitting: Family Medicine

## 2024-06-06 DIAGNOSIS — R928 Other abnormal and inconclusive findings on diagnostic imaging of breast: Secondary | ICD-10-CM

## 2024-06-09 ENCOUNTER — Other Ambulatory Visit: Payer: Self-pay | Admitting: Internal Medicine

## 2024-06-09 DIAGNOSIS — I10 Essential (primary) hypertension: Secondary | ICD-10-CM

## 2024-06-12 ENCOUNTER — Ambulatory Visit

## 2024-06-12 ENCOUNTER — Ambulatory Visit
Admission: RE | Admit: 2024-06-12 | Discharge: 2024-06-12 | Disposition: A | Source: Ambulatory Visit | Attending: Family Medicine

## 2024-06-12 DIAGNOSIS — R928 Other abnormal and inconclusive findings on diagnostic imaging of breast: Secondary | ICD-10-CM

## 2024-06-19 DIAGNOSIS — J4541 Moderate persistent asthma with (acute) exacerbation: Secondary | ICD-10-CM | POA: Diagnosis not present

## 2024-07-05 ENCOUNTER — Other Ambulatory Visit: Payer: Self-pay | Admitting: Family Medicine

## 2024-07-05 ENCOUNTER — Telehealth: Payer: Self-pay

## 2024-07-05 DIAGNOSIS — E1140 Type 2 diabetes mellitus with diabetic neuropathy, unspecified: Secondary | ICD-10-CM

## 2024-07-05 NOTE — Telephone Encounter (Signed)
 no

## 2024-07-05 NOTE — Telephone Encounter (Signed)
 I have not seen her in 8 months

## 2024-07-05 NOTE — Telephone Encounter (Unsigned)
 Copied from CRM #[id number]. Topic: General - Other >> Jul 05, 2024  2:13 PM Julie Rivas wrote: Reason for CRM: Patient requests a return call from a nurse or provider, as soon as possible.

## 2024-07-05 NOTE — Telephone Encounter (Signed)
 We received an Oxygen report this week from [facility] that was done back in May and was ordered by Dr. Joshua. It was placed on Corean Ku desk, due to patients PCP showing Ku as her PCP. This was due to patient requesting to switch her PCP from Dr.Jones during her 09/08 visit with Corean, to make [place] her PCP. Patient requested to switch PCP's due to not being able to ever get in with Dr.Jones. Corean does not read or order those type of test, so I reached out to the patient to make her aware. I advised patient we received this report from a test that was done back in May, and since [facility] does not read those test, we would need to send her to Pulmonology since Corean is her PCP. Patient states she is not a fan of [facility], because she also just received a $50 bill in the mail from them for the testing that was completed. Patient then states she never requested to switch her PCP, although her 6 month follow up for March 2026, was scheduled with Ku. Patient states she was only seeing Corean because she could not schedule with Dr.Jones, she wants to see them both. Advised patient she cannot have 2 PCP's. Patient states due to her insurance card stating that Dr.Jones is her PCP she is keeping him as her PCP, but will see Corean for acute matters when she cannot see Dr.Jones. I let her know I will switch her PCP back to Dr.Jones, and moved her 6 month follow up to be with Dr.Jones in March 2026. Patient gave verbal understanding, gave oxygen report back to Dr.Jones for him to handle.

## 2024-07-06 ENCOUNTER — Other Ambulatory Visit: Payer: Self-pay | Admitting: Internal Medicine

## 2024-07-06 DIAGNOSIS — E1140 Type 2 diabetes mellitus with diabetic neuropathy, unspecified: Secondary | ICD-10-CM

## 2024-07-06 MED ORDER — PREGABALIN 25 MG PO CAPS: 25.0000 mg | ORAL_CAPSULE | Freq: Three times a day (TID) | ORAL | 0 refills | Status: DC

## 2024-07-09 ENCOUNTER — Other Ambulatory Visit: Payer: Self-pay | Admitting: Internal Medicine

## 2024-07-09 DIAGNOSIS — I10 Essential (primary) hypertension: Secondary | ICD-10-CM

## 2024-07-11 ENCOUNTER — Ambulatory Visit: Payer: Self-pay | Admitting: Internal Medicine

## 2024-07-11 ENCOUNTER — Ambulatory Visit

## 2024-07-11 ENCOUNTER — Ambulatory Visit: Admitting: Internal Medicine

## 2024-07-11 VITALS — BP 138/82 | HR 85 | Temp 97.6°F | Resp 16 | Ht 61.0 in | Wt 143.0 lb

## 2024-07-11 DIAGNOSIS — E1140 Type 2 diabetes mellitus with diabetic neuropathy, unspecified: Secondary | ICD-10-CM

## 2024-07-11 DIAGNOSIS — M85852 Other specified disorders of bone density and structure, left thigh: Secondary | ICD-10-CM | POA: Diagnosis not present

## 2024-07-11 DIAGNOSIS — M25552 Pain in left hip: Secondary | ICD-10-CM

## 2024-07-11 DIAGNOSIS — Z23 Encounter for immunization: Secondary | ICD-10-CM

## 2024-07-11 DIAGNOSIS — G8929 Other chronic pain: Secondary | ICD-10-CM

## 2024-07-11 DIAGNOSIS — E785 Hyperlipidemia, unspecified: Secondary | ICD-10-CM

## 2024-07-11 DIAGNOSIS — I10 Essential (primary) hypertension: Secondary | ICD-10-CM | POA: Diagnosis not present

## 2024-07-11 DIAGNOSIS — E876 Hypokalemia: Secondary | ICD-10-CM | POA: Diagnosis not present

## 2024-07-11 DIAGNOSIS — M160 Bilateral primary osteoarthritis of hip: Secondary | ICD-10-CM | POA: Diagnosis not present

## 2024-07-11 LAB — BASIC METABOLIC PANEL WITH GFR
BUN: 13 mg/dL (ref 6–23)
CO2: 29 meq/L (ref 19–32)
Calcium: 9.2 mg/dL (ref 8.4–10.5)
Chloride: 103 meq/L (ref 96–112)
Creatinine, Ser: 0.86 mg/dL (ref 0.40–1.20)
GFR: 68.44 mL/min (ref >60.00)
Glucose, Bld: 83 mg/dL (ref 70–99)
Potassium: 3.1 meq/L — ABNORMAL LOW (ref 3.5–5.1)
Sodium: 140 meq/L (ref 135–145)

## 2024-07-11 LAB — LIPID PANEL
Cholesterol: 247 mg/dL — ABNORMAL HIGH (ref 0–200)
HDL: 46.3 mg/dL (ref >39.00)
LDL Cholesterol: 158 mg/dL — ABNORMAL HIGH (ref 0–99)
NonHDL: 200.57
Total CHOL/HDL Ratio: 5
Triglycerides: 211 mg/dL — ABNORMAL HIGH (ref 0.0–149.0)
VLDL: 42.2 mg/dL — ABNORMAL HIGH (ref 0.0–40.0)

## 2024-07-11 LAB — MAGNESIUM: Magnesium: 1.9 mg/dL (ref 1.5–2.5)

## 2024-07-11 MED ORDER — PREGABALIN 50 MG PO CAPS: 50.0000 mg | ORAL_CAPSULE | Freq: Three times a day (TID) | ORAL | 0 refills | Status: AC

## 2024-07-11 NOTE — Patient Instructions (Signed)
 Hypokalemia Hypokalemia means that the amount of potassium in the blood is lower than normal. Potassium is a mineral (electrolyte) that helps regulate the amount of fluid in the body. It also stimulates muscle tightening (contraction) and helps nerves work properly. Normally, most of the body's potassium is inside cells, and only a very small amount is in the blood. Because the amount in the blood is so small, minor changes to potassium levels in the blood can be life-threatening. What are the causes? This condition may be caused by: Antibiotic medicine. Diarrhea or vomiting. Taking too much of a medicine that helps you have a bowel movement (laxative) can cause diarrhea and lead to hypokalemia. Chronic kidney disease (CKD). Medicines that help the body get rid of excess fluid (diuretics). Eating disorders, such as anorexia or bulimia. Low magnesium levels in the body. Sweating a lot. What are the signs or symptoms? Symptoms of this condition include: Weakness. Constipation. Fatigue. Muscle cramps. Mental confusion. Skipped heartbeats or irregular heartbeat (palpitations). Tingling or numbness. How is this diagnosed? This condition is diagnosed with a blood test. How is this treated? This condition may be treated by: Taking potassium supplements. Adjusting the medicines that you take. Eating more foods that contain a lot of potassium. If your potassium level is very low, you may need to get potassium through an IV and be monitored in the hospital. Follow these instructions at home: Eating and drinking  Eat a healthy diet. A healthy diet includes fresh fruits and vegetables, whole grains, healthy fats, and lean proteins. If told, eat more foods that contain a lot of potassium. These include: Nuts, such as peanuts and pistachios. Seeds, such as sunflower seeds and pumpkin seeds. Peas, lentils, and lima beans. Whole grain and bran cereals and breads. Fresh fruits and vegetables,  such as apricots, avocado, bananas, cantaloupe, kiwi, oranges, tomatoes, asparagus, and potatoes. Juices, such as orange, tomato, and prune. Lean meats, including fish. Milk and milk products, such as yogurt. General instructions Take over-the-counter and prescription medicines only as told by your health care provider. This includes vitamins, natural food products, and supplements. Keep all follow-up visits. This is important. Contact a health care provider if: You have weakness that gets worse. You feel your heart pounding or racing. You vomit. You have diarrhea. You have diabetes and you have trouble keeping your blood sugar in your target range. Get help right away if: You have chest pain. You have shortness of breath. You have vomiting or diarrhea that lasts for more than 2 days. You faint. These symptoms may be an emergency. Get help right away. Call 911. Do not wait to see if the symptoms will go away. Do not drive yourself to the hospital. Summary Hypokalemia means that the amount of potassium in the blood is lower than normal. This condition is diagnosed with a blood test. Hypokalemia may be treated by taking potassium supplements, adjusting the medicines that you take, or eating more foods that are high in potassium. If your potassium level is very low, you may need to get potassium through an IV and be monitored in the hospital. This information is not intended to replace advice given to you by your health care provider. Make sure you discuss any questions you have with your health care provider. Document Revised: 05/08/2021 Document Reviewed: 05/08/2021 Elsevier Patient Education  2024 [employer].

## 2024-07-11 NOTE — Progress Notes (Addendum)
 "  Subjective:  Patient ID: Julie Rivas, female    DOB: 12/31/1955  Age: 68 y.o. MRN: [id number]  CC: Hypertension and Hyperlipidemia   HPI Julie Rivas presents for f/up ---  Discussed the use of AI scribe software for clinical note transcription with the patient, who gave verbal consent to proceed.  History of Present Illness Julie Rivas is a 68 year old female who presents with hip pain and neuropathy in her fingers.  She experiences severe sharp pain in her left hip that radiates down to her leg and ankle. This pain has persisted for a couple of months and is described as intense enough to 'make you jump out of your skin'. There is no history of injury to the hip, and she has not sought prior medical evaluation for this issue. She has been taking Tylenol for relief, but the pain persists and is intermittent.  She also experiences neuropathy in her fingers, which wakes her up every morning between 4 and 5 AM. The pain is described as throbbing and takes about an hour to subside after waking. She uses cold water and a pain cream to ease the symptoms. She takes Lyrica  three times a day, with one dose at night and two in the morning, but is unsure of its effectiveness.  No recent trouble breathing and does not use oxygen. She occasionally uses an inhaler, similar to albuterol , but not frequently. She uses a nebulizer with medication packs about once a week as needed.  Her recent cholesterol level was high, with an LDL of 20 on August 12th. She is currently taking rosuvastatin  and Zetia  for cholesterol management.  No symptoms of high blood pressure such as headaches or blurred vision and reports her blood pressure has been stable recently.     Outpatient Medications Prior to Visit  Medication Sig Dispense Refill   blood glucose meter kit and supplies Dispense based on patient and insurance preference. Use up to four times daily as directed. (FOR ICD-10 E10.9, E11.9). 1 each 0    clonazePAM  (KLONOPIN ) 0.5 MG tablet Take 1 tablet (0.5 mg total) by mouth 2 (two) times daily as needed for anxiety. 180 tablet 0   ezetimibe  (ZETIA ) 10 MG tablet Take 1 tablet (10 mg total) by mouth daily. 90 tablet 0   Lancets (ONETOUCH DELICA PLUS LANCET33G) MISC Place 1 Act onto the skin 4 (four) times daily as needed. 100 each 5   montelukast  (SINGULAIR ) 10 MG tablet Take 1 tablet (10 mg total) by mouth at bedtime. 90 tablet 0   nebivolol  (BYSTOLIC ) 5 MG tablet Take 1 tablet (5 mg total) by mouth daily. 90 tablet 0   Nebulizer MISC by Does not apply route.     ONETOUCH ULTRA test strip USE AS DIRECTED UP TO  4 TIMES DAILY TO CHECK BLOOD SUGARS 100 each 5   rosuvastatin  (CRESTOR ) 40 MG tablet Take 1 tablet (40 mg total) by mouth daily. 90 tablet 1   spironolactone  (ALDACTONE ) 50 MG tablet Take 1 tablet by mouth once daily 30 tablet 0   Vitamin D , Ergocalciferol , (DRISDOL ) 1.25 MG (50000 UNIT) CAPS capsule Take 1 capsule (50,000 Units total) by mouth every 7 (seven) days. 8 capsule 0   metoprolol  tartrate (LOPRESSOR ) 100 MG tablet Take tablet (100mg ) TWO hours prior to your cardiac CT scan. 1 tablet 0   potassium chloride  (KLOR-CON ) 10 MEQ tablet TAKE 1 TABLET BY MOUTH THREE TIMES DAILY (Patient taking differently: Take 10 mEq by mouth  2 (two) times daily.) 270 tablet 0   pregabalin  (LYRICA ) 25 MG capsule Take 1 capsule (25 mg total) by mouth 3 (three) times daily. 270 capsule 0   albuterol  (PROVENTIL ) (2.5 MG/3ML) 0.083% nebulizer solution USE 1 VIAL IN NEBULIZER EVERY 6 HOURS AS NEEDED FOR WHEEZING OR SHORTNESS OF BREATH (Patient not taking: Reported on 07/11/2024) 150 mL 0   budesonide  (PULMICORT ) 0.5 MG/2ML nebulizer solution Take 2 mLs (0.5 mg total) by nebulization 2 (two) times daily. (Patient not taking: Reported on 07/11/2024) 120 mL 3   fluticasone -salmeterol (WIXELA INHUB) 250-50 MCG/ACT AEPB Inhale 1 puff into the lungs in the morning and at bedtime. (Patient not taking: Reported on  07/11/2024) 60 each 3   cyclobenzaprine  (FLEXERIL ) 10 MG tablet Take 1 tablet (10 mg total) by mouth 3 (three) times daily as needed for muscle spasms. (Patient not taking: Reported on 07/11/2024) 30 tablet 0   predniSONE  (DELTASONE ) 20 MG tablet Take 2 tablets (40 mg total) by mouth daily for 5 days. (Patient not taking: Reported on 05/30/2024) 10 tablet 0   No facility-administered medications prior to visit.    ROS Review of Systems  Constitutional:  Negative for appetite change, chills, diaphoresis, fatigue and fever.  HENT: Negative.    Eyes: Negative.   Respiratory: Negative.  Negative for cough, chest tightness, shortness of breath and wheezing.   Cardiovascular:  Negative for chest pain, palpitations and leg swelling.  Gastrointestinal: Negative.  Negative for abdominal pain, constipation, diarrhea, nausea and vomiting.  Endocrine: Negative.   Genitourinary: Negative.  Negative for difficulty urinating and dysuria.  Musculoskeletal:  Positive for arthralgias and back pain. Negative for myalgias.  Skin: Negative.  Negative for color change and pallor.  Neurological:  Positive for numbness. Negative for dizziness, weakness and headaches.  Hematological:  Negative for adenopathy. Does not bruise/bleed easily.  Psychiatric/Behavioral: Negative.      Objective:  BP 138/82 (BP Location: Right Arm, Patient Position: Sitting, Cuff Size: Normal)   Pulse 85   Temp 97.6 F (36.4 C) (Temporal)   Resp 16   Ht 5' 1 (1.549 m)   Wt 143 lb (64.9 kg)   SpO2 96%   BMI 27.02 kg/m   BP Readings from Last 3 Encounters:  07/11/24 138/82  05/15/24 102/76  04/18/24 120/80    Wt Readings from Last 3 Encounters:  07/11/24 143 lb (64.9 kg)  05/30/24 139 lb (63 kg)  05/15/24 141 lb 9.6 oz (64.2 kg)    Physical Exam Vitals reviewed.  Constitutional:      Appearance: Normal appearance.  HENT:     Nose: Nose normal.     Mouth/Throat:     Mouth: Mucous membranes are moist.  Eyes:      General: No scleral icterus.    Conjunctiva/sclera: Conjunctivae normal.  Cardiovascular:     Rate and Rhythm: Normal rate and regular rhythm.     Heart sounds: No murmur heard.    No friction rub. No gallop.     Comments: EKG--- NSR, 85 bpm Minimal LVH Septal infarct pattern is not new Unchanged  Pulmonary:     Effort: Pulmonary effort is normal.     Breath sounds: No stridor. No wheezing, rhonchi or rales.  Abdominal:     General: Abdomen is flat.     Tenderness: There is no abdominal tenderness. There is no guarding.     Hernia: No hernia is present.  Musculoskeletal:        General: Normal range of motion.  Cervical back: Neck supple.     Right lower leg: No edema.     Left lower leg: No edema.  Lymphadenopathy:     Cervical: No cervical adenopathy.  Skin:    General: Skin is dry.  Neurological:     General: No focal deficit present.     Mental Status: She is alert.  Psychiatric:        Mood and Affect: Mood normal.        Behavior: Behavior normal.     Lab Results  Component Value Date   WBC 5.0 04/18/2024   HGB 13.2 04/18/2024   HCT 39.3 04/18/2024   PLT 246.0 04/18/2024   GLUCOSE 83 07/11/2024   CHOL 247 (H) 07/11/2024   TRIG 211.0 (H) 07/11/2024   HDL 46.30 07/11/2024   LDLCALC 158 (H) 07/11/2024   ALT 16 05/15/2024   AST 19 05/15/2024   NA 140 07/11/2024   K 3.1 (L) 07/11/2024   CL 103 07/11/2024   CREATININE 0.86 07/11/2024   BUN 13 07/11/2024   CO2 29 07/11/2024   TSH 0.87 11/01/2023   PSA WNL 10/30/2015   INR 1.1 (H) 12/31/2015   HGBA1C 5.8 04/18/2024   MICROALBUR 1.6 04/18/2024    MM 3D DIAGNOSTIC MAMMOGRAM UNILATERAL RIGHT BREAST Result Date: 06/12/2024 CLINICAL DATA:  68 year old female presents for further evaluation of possible RIGHT breast asymmetry identified on screening mammogram. EXAM: DIGITAL DIAGNOSTIC UNILATERAL RIGHT MAMMOGRAM WITH TOMOSYNTHESIS AND CAD TECHNIQUE: Right digital diagnostic mammography and breast tomosynthesis  was performed. The images were evaluated with computer-aided detection. COMPARISON:  Previous exam(s). ACR Breast Density Category c: The breasts are heterogeneously dense, which may obscure small masses. FINDINGS: Full field and spot compression views of the RIGHT breast demonstrate dispersal of the screening study asymmetry without persistent abnormality in this area. IMPRESSION: No persistent abnormality at the site of the screening study finding. RECOMMENDATION: Bilateral screening mammogram in 1 year. I have discussed the findings and recommendations with the patient. If applicable, a reminder letter will be sent to the patient regarding the next appointment. BI-RADS CATEGORY  1: Negative. Electronically Signed   By: Reyes Phi M.D.   On: 06/12/2024 11:28   No results found.    Assessment & Plan:  Need for influenza vaccination -     Flu vaccine HIGH DOSE PF(Fluzone Trivalent)  Chronic hypokalemia -     Basic metabolic panel with GFR; Future -     Magnesium; Future -     AMB Referral VBCI Care Management -     Potassium Chloride  ER; Take 1 tablet (10 mEq total) by mouth 3 (three) times daily.  Dispense: 270 tablet; Refill: 0  Hyperlipidemia with target LDL less than 130- She has not achieved her LDL goal. -     AMB Referral VBCI Care Management -     Lipid panel; Future  Chronic left hip pain -     DG HIP UNILAT W OR W/O PELVIS 2-3 VIEWS LEFT; Future  Essential hypertension- BP is well controlled. -     EKG 12-Lead -     Urinalysis, Routine w reflex microscopic; Future -     Potassium Chloride  ER; Take 1 tablet (10 mEq total) by mouth 3 (three) times daily.  Dispense: 270 tablet; Refill: 0  Painful diabetic neuropathy (HCC) -     Pregabalin ; Take 1 capsule (50 mg total) by mouth 3 (three) times daily.  Dispense: 270 capsule; Refill: 0     Follow-up: Return in  about 3 months (around 10/11/2024).  Debby Molt, MD "

## 2024-07-12 LAB — URINALYSIS, ROUTINE W REFLEX MICROSCOPIC
Bilirubin Urine: NEGATIVE
Ketones, ur: NEGATIVE
Leukocytes,Ua: NEGATIVE
Nitrite: NEGATIVE
Specific Gravity, Urine: 1.025 (ref 1.000–1.030)
Total Protein, Urine: NEGATIVE
Urine Glucose: NEGATIVE
Urobilinogen, UA: 0.2 (ref 0.0–1.0)
pH: 6 (ref 5.0–8.0)

## 2024-07-12 MED ORDER — POTASSIUM CHLORIDE ER 10 MEQ PO TBCR
10.0000 meq | EXTENDED_RELEASE_TABLET | Freq: Three times a day (TID) | ORAL | 0 refills | Status: AC
Start: 2024-07-12 — End: ?

## 2024-07-14 ENCOUNTER — Telehealth: Payer: Self-pay | Admitting: *Deleted

## 2024-07-14 NOTE — Progress Notes (Signed)
 Care Guide Pharmacy Note  07/14/2024 Name: [name] MRN: [id number] DOB: 01-01-1956  Referred By: Joshua Debby CROME, MD Reason for referral: Call Attempt #1 and Complex Care Management (Outreach to schedule referral with pharmacist )   Julie Rivas is a 68 y.o. year old female who is a primary care patient of Joshua Debby CROME, MD.  [name] was referred to the pharmacist for assistance related to: HLD  An unsuccessful telephone outreach was attempted today to contact the patient who was referred to the pharmacy team for assistance with medication management. Additional attempts will be made to contact the patient.  Thedford Franks, CMA [place]  [place], [facility] Guide Direct Dial: [phone]  Fax: [phone] Website: [email or web address]

## 2024-07-17 ENCOUNTER — Other Ambulatory Visit: Payer: Self-pay | Admitting: Internal Medicine

## 2024-07-17 DIAGNOSIS — F411 Generalized anxiety disorder: Secondary | ICD-10-CM

## 2024-07-17 NOTE — Progress Notes (Unsigned)
 Care Guide Pharmacy Note  07/17/2024 Name: Julie Rivas MRN: [id number] DOB: [birth year]  Referred By: Joshua Debby CROME, MD Reason for referral: Call Attempt #1 and Complex Care Management (Outreach to schedule referral with pharmacist )   Julie Rivas is a 68 y.o. year old female who is a primary care patient of Joshua Debby CROME, MD.  Julie Rivas was referred to the pharmacist for assistance related to: HLD  A second unsuccessful telephone outreach was attempted today to contact the patient who was referred to the pharmacy team for assistance with medication management. Additional attempts will be made to contact the patient.  Julie Rivas, CMA [place]  [facility], [facility] Guide Direct Dial: [phone]  Fax: [phone] Website: [email or web address]

## 2024-07-18 NOTE — Progress Notes (Signed)
 Care Guide Pharmacy Note  07/18/2024 Name: Julie Rivas MRN: [id number] DOB: 11/25/1955  Referred By: Joshua Debby CROME, MD Reason for referral: Call Attempt #1 and Complex Care Management (Outreach to schedule referral with pharmacist )   Julie Rivas is a 68 y.o. year old female who is a primary care patient of Joshua Debby CROME, MD.  Julie Rivas was referred to the pharmacist for assistance related to: HLD  A third unsuccessful telephone outreach was attempted today to contact the patient who was referred to the pharmacy team for assistance with medication management. The Population Health team is pleased to engage with this patient at any time in the future upon receipt of referral and should he/she be interested in assistance from the [facility] team.  Thedford Franks, CMA, Care Guide [facility]  [facility], [facility] Guide Direct Dial: [phone]  Fax: [phone] Website: [email or web address]

## 2024-07-20 DIAGNOSIS — J4541 Moderate persistent asthma with (acute) exacerbation: Secondary | ICD-10-CM | POA: Diagnosis not present

## 2024-07-29 ENCOUNTER — Other Ambulatory Visit: Payer: Self-pay | Admitting: Family Medicine

## 2024-07-29 DIAGNOSIS — E785 Hyperlipidemia, unspecified: Secondary | ICD-10-CM

## 2024-07-31 ENCOUNTER — Ambulatory Visit: Payer: Self-pay

## 2024-07-31 NOTE — Telephone Encounter (Signed)
 FYI Only or Action Required?: FYI only for provider: Patient declined to make appointment.  Patient was last seen in primary care on 07/11/2024 by Joshua Debby CROME, MD.  Called Nurse Triage reporting Facial Pain and Sinusitis.  Symptoms began a week ago.  Interventions attempted: OTC medications: tylenol.  Symptoms are: gradually worsening.  Triage Disposition: See PCP When Office is Open (Within 3 Days)  Patient/caregiver understands and will follow disposition?: No, refuses disposition  Copied from CRM #[id number]. Topic: Clinical - Red Word Triage >> Jul 31, 2024 10:30 AM Willma SAUNDERS wrote: [facility] that prompted transfer to Nurse Triage: Patient thinks she has a sinus infection, has pain in her face, teeth, and ears. Reason for Disposition  [1] MILD pain (e.g., does not interfere with normal activities) AND [2] constant AND [3] present > 24 hours  (Exception: Symptom is chronic.)  Answer Assessment - Initial Assessment Questions Patient reports sinus pain. Appointment offered with providers tomorrow. She only wanted to see Dr Joshua or Corean Ku. She declined. 1. ONSET: When did the pain start? (e.g., minutes, hours, days)     Last week 2. ONSET: Does the pain come and go, or has it been constant since it started? (e.g., constant, intermittent, fleeting)     Constant 3. SEVERITY: How bad is the pain? (Scale 1-10; mild, moderate or severe)     6 4. LOCATION: Where does it hurt?      Facial pain, Ears, eyes 5. RASH: Is there any redness, rash, or swelling of your face?     Denies 6. FEVER: Do you have a fever? If Yes, ask: What is it, how was it measured, and when did it start?      Denies 7. OTHER SYMPTOMS: Do you have any other symptoms? (e.g., fever, toothache, nasal discharge, nasal congestion, clicking sensation in jaw joint)     Last week had nasal discharge, denies today, headache  Protocols used: Face Pain-A-AH

## 2024-07-31 NOTE — Telephone Encounter (Signed)
 She need another office visit ?

## 2024-08-06 ENCOUNTER — Other Ambulatory Visit: Payer: Self-pay | Admitting: Internal Medicine

## 2024-08-06 DIAGNOSIS — I10 Essential (primary) hypertension: Secondary | ICD-10-CM

## 2024-08-19 DIAGNOSIS — J4541 Moderate persistent asthma with (acute) exacerbation: Secondary | ICD-10-CM | POA: Diagnosis not present

## 2024-09-11 ENCOUNTER — Encounter: Payer: Self-pay | Admitting: Internal Medicine

## 2024-09-11 ENCOUNTER — Ambulatory Visit (INDEPENDENT_AMBULATORY_CARE_PROVIDER_SITE_OTHER)

## 2024-09-11 ENCOUNTER — Ambulatory Visit: Admitting: Internal Medicine

## 2024-09-11 VITALS — BP 124/86 | HR 99 | Temp 98.1°F | Resp 16 | Ht 61.0 in | Wt 144.2 lb

## 2024-09-11 DIAGNOSIS — E785 Hyperlipidemia, unspecified: Secondary | ICD-10-CM

## 2024-09-11 DIAGNOSIS — R052 Subacute cough: Secondary | ICD-10-CM

## 2024-09-11 DIAGNOSIS — E876 Hypokalemia: Secondary | ICD-10-CM

## 2024-09-11 DIAGNOSIS — T502X5A Adverse effect of carbonic-anhydrase inhibitors, benzothiadiazides and other diuretics, initial encounter: Secondary | ICD-10-CM | POA: Diagnosis not present

## 2024-09-11 DIAGNOSIS — J22 Unspecified acute lower respiratory infection: Secondary | ICD-10-CM

## 2024-09-11 MED ORDER — ROSUVASTATIN CALCIUM 40 MG PO TABS: 40.0000 mg | ORAL_TABLET | Freq: Every day | ORAL | 1 refills | Status: AC

## 2024-09-11 MED ORDER — CEFPODOXIME PROXETIL 200 MG PO TABS: 200.0000 mg | ORAL_TABLET | Freq: Two times a day (BID) | ORAL | 0 refills | Status: AC

## 2024-09-11 MED ORDER — HYDROCODONE BIT-HOMATROP MBR 5-1.5 MG/5ML PO SOLN: 5.0000 mL | Freq: Three times a day (TID) | ORAL | 0 refills | Status: AC | PRN

## 2024-09-11 NOTE — Patient Instructions (Signed)

## 2024-09-11 NOTE — Progress Notes (Signed)
 "  Subjective:  Patient ID: Julie Rivas, female    DOB: 1956/04/03  Age: 69 y.o. MRN: [id number]  CC: URI   HPI Julie Rivas presents for cough ---  Discussed the use of AI scribe software for clinical note transcription with the patient, who gave verbal consent to proceed.  History of Present Illness Julie Rivas is a 69 year old female who presents with a two-week history of cough and sore throat.  She has been experiencing symptoms consistent with a cold for about two weeks, which began after visiting her grandchildren for Christmas, where they had a 'little bug'.  She describes a cough that occurs primarily at night and in the morning, with little to no cough during the daytime. The cough is occasionally productive of a small amount of yellowish phlegm. No hemoptysis is noted.  No significant shortness of breath, fever, or chills. She experiences some sweating without associated chest pain. She also notes a very sore throat.  She has been using a vaporizer at night to help with her symptoms and has been taking Nyquil Cold & Flu, which she feels helps her rest a little.     Outpatient Medications Prior to Visit  Medication Sig Dispense Refill   blood glucose meter kit and supplies Dispense based on patient and insurance preference. Use up to four times daily as directed. (FOR ICD-10 E10.9, E11.9). 1 each 0   clonazePAM  (KLONOPIN ) 0.5 MG tablet Take 1 tablet by mouth twice daily as needed for anxiety 60 tablet 3   ezetimibe  (ZETIA ) 10 MG tablet Take 1 tablet (10 mg total) by mouth daily. 90 tablet 0   Lancets (ONETOUCH DELICA PLUS LANCET33G) MISC Place 1 Act onto the skin 4 (four) times daily as needed. 100 each 5   montelukast  (SINGULAIR ) 10 MG tablet Take 1 tablet (10 mg total) by mouth at bedtime. 90 tablet 0   nebivolol  (BYSTOLIC ) 5 MG tablet Take 1 tablet (5 mg total) by mouth daily. 90 tablet 0   Nebulizer MISC by Does not apply route.     ONETOUCH ULTRA test  strip USE AS DIRECTED UP TO  4 TIMES DAILY TO CHECK BLOOD SUGARS 100 each 5   potassium chloride  (KLOR-CON ) 10 MEQ tablet Take 1 tablet (10 mEq total) by mouth 3 (three) times daily. 270 tablet 0   pregabalin  (LYRICA ) 50 MG capsule Take 1 capsule (50 mg total) by mouth 3 (three) times daily. 270 capsule 0   spironolactone  (ALDACTONE ) 50 MG tablet Take 1 tablet by mouth once daily 90 tablet 0   Vitamin D , Ergocalciferol , (DRISDOL ) 1.25 MG (50000 UNIT) CAPS capsule Take 1 capsule (50,000 Units total) by mouth every 7 (seven) days. 8 capsule 0   rosuvastatin  (CRESTOR ) 40 MG tablet Take 1 tablet (40 mg total) by mouth daily. 90 tablet 1   albuterol  (PROVENTIL ) (2.5 MG/3ML) 0.083% nebulizer solution USE 1 VIAL IN NEBULIZER EVERY 6 HOURS AS NEEDED FOR WHEEZING OR SHORTNESS OF BREATH (Patient not taking: Reported on 09/11/2024) 150 mL 0   budesonide  (PULMICORT ) 0.5 MG/2ML nebulizer solution Take 2 mLs (0.5 mg total) by nebulization 2 (two) times daily. (Patient not taking: Reported on 09/11/2024) 120 mL 3   fluticasone -salmeterol (WIXELA INHUB) 250-50 MCG/ACT AEPB Inhale 1 puff into the lungs in the morning and at bedtime. (Patient not taking: Reported on 09/11/2024) 60 each 3   No facility-administered medications prior to visit.    ROS Review of Systems  Constitutional: Negative.  Negative  for appetite change, chills, diaphoresis, fatigue and fever.  HENT:  Positive for sinus pressure, sinus pain and sore throat. Negative for mouth sores, postnasal drip, rhinorrhea and trouble swallowing.   Respiratory:  Positive for cough. Negative for chest tightness, shortness of breath and wheezing.   Cardiovascular:  Negative for chest pain, palpitations and leg swelling.  Gastrointestinal: Negative.  Negative for abdominal pain, constipation, diarrhea, nausea and vomiting.  Endocrine: Negative.   Genitourinary: Negative.  Negative for difficulty urinating.  Musculoskeletal: Negative.  Negative for arthralgias and  myalgias.  Skin: Negative.   Neurological: Negative.  Negative for dizziness, weakness, light-headedness and headaches.  Hematological:  Negative for adenopathy. Does not bruise/bleed easily.  Psychiatric/Behavioral: Negative.      Objective:  BP 124/86 (BP Location: Left Arm, Patient Position: Sitting, Cuff Size: Normal)   Pulse 99   Temp 98.1 F (36.7 C) (Oral)   Resp 16   Ht 5' 1 (1.549 m)   Wt 144 lb 3.2 oz (65.4 kg)   SpO2 97%   BMI 27.25 kg/m   BP Readings from Last 3 Encounters:  09/11/24 124/86  07/11/24 138/82  05/15/24 102/76    Wt Readings from Last 3 Encounters:  09/11/24 144 lb 3.2 oz (65.4 kg)  07/11/24 143 lb (64.9 kg)  05/30/24 139 lb (63 kg)    Physical Exam Vitals reviewed.  Constitutional:      General: She is not in acute distress.    Appearance: She is not ill-appearing, toxic-appearing or diaphoretic.  HENT:     Right Ear: Hearing, tympanic membrane, ear canal and external ear normal.     Left Ear: Hearing, tympanic membrane, ear canal and external ear normal.     Nose: Nose normal.     Mouth/Throat:     Mouth: Mucous membranes are moist.  Eyes:     General: No scleral icterus.    Conjunctiva/sclera: Conjunctivae normal.  Cardiovascular:     Rate and Rhythm: Normal rate and regular rhythm.     Pulses: Normal pulses.     Heart sounds: No murmur heard.    No gallop.  Pulmonary:     Effort: Pulmonary effort is normal.     Breath sounds: No stridor. No wheezing, rhonchi or rales.  Abdominal:     General: Abdomen is flat.     Palpations: There is no mass.     Tenderness: There is no abdominal tenderness. There is no guarding.     Hernia: No hernia is present.  Musculoskeletal:        General: Normal range of motion.     Cervical back: Neck supple.     Right lower leg: No edema.     Left lower leg: No edema.  Lymphadenopathy:     Cervical: No cervical adenopathy.  Skin:    General: Skin is warm and dry.  Neurological:     General:  No focal deficit present.     Mental Status: She is alert.  Psychiatric:        Mood and Affect: Mood normal.        Behavior: Behavior normal.     Lab Results  Component Value Date   WBC 5.0 04/18/2024   HGB 13.2 04/18/2024   HCT 39.3 04/18/2024   PLT 246.0 04/18/2024   GLUCOSE 83 07/11/2024   CHOL 247 (H) 07/11/2024   TRIG 211.0 (H) 07/11/2024   HDL 46.30 07/11/2024   LDLCALC 158 (H) 07/11/2024   ALT 16 05/15/2024  AST 19 05/15/2024   NA 140 07/11/2024   K 3.1 (L) 07/11/2024   CL 103 07/11/2024   CREATININE 0.86 07/11/2024   BUN 13 07/11/2024   CO2 29 07/11/2024   TSH 0.87 11/01/2023   PSA WNL 10/30/2015   INR 1.1 (H) 12/31/2015   HGBA1C 5.8 04/18/2024   MICROALBUR 1.6 04/18/2024    MM 3D DIAGNOSTIC MAMMOGRAM UNILATERAL RIGHT BREAST Result Date: 06/12/2024 CLINICAL DATA:  69 year old female presents for further evaluation of possible RIGHT breast asymmetry identified on screening mammogram. EXAM: DIGITAL DIAGNOSTIC UNILATERAL RIGHT MAMMOGRAM WITH TOMOSYNTHESIS AND CAD TECHNIQUE: Right digital diagnostic mammography and breast tomosynthesis was performed. The images were evaluated with computer-aided detection. COMPARISON:  Previous exam(s). ACR Breast Density Category c: The breasts are heterogeneously dense, which may obscure small masses. FINDINGS: Full field and spot compression views of the RIGHT breast demonstrate dispersal of the screening study asymmetry without persistent abnormality in this area. IMPRESSION: No persistent abnormality at the site of the screening study finding. RECOMMENDATION: Bilateral screening mammogram in 1 year. I have discussed the findings and recommendations with the patient. If applicable, a reminder letter will be sent to the patient regarding the next appointment. BI-RADS CATEGORY  1: Negative. Electronically Signed   By: Reyes Phi M.D.   On: 06/12/2024 11:28    The 10-year ASCVD risk score (Arnett DK, et al., 2019) is: 29.1%   Values  used to calculate the score:     Age: 89 years     Clinically relevant sex: Female     Is Non-Hispanic African American: Yes     Diabetic: Yes     Tobacco smoker: No     Systolic Blood Pressure: 124 mmHg     Is BP treated: Yes     HDL Cholesterol: 46.3 mg/dL     Total Cholesterol: 247 mg/dL   DG Chest 2 View Result Date: 09/11/2024 EXAM: 2 VIEW(S) XRAY OF THE CHEST 09/11/2024 01:59:04 PM COMPARISON: 11/01/2023 CLINICAL HISTORY: cough FINDINGS: LUNGS AND PLEURA: No focal pulmonary opacity. No pleural effusion. No pneumothorax. HEART AND MEDIASTINUM: No acute abnormality of the cardiac and mediastinal silhouettes. BONES AND SOFT TISSUES: Mild multilevel degenerative changes of Thoracic spine. No acute osseous abnormality. IMPRESSION: 1. No acute findings. Electronically signed by: Dayne Hassell MD 09/11/2024 02:34 PM EST RP Workstation: [id number]     Assessment & Plan:   Subacute cough -     DG Chest 2 View; Future -     HYDROcodone  Bit-Homatrop MBr; Take 5 mLs by mouth every 8 (eight) hours as needed for cough.  Dispense: 120 mL; Refill: 0  Diuretic-induced hypokalemia -     Magnesium; Future -     Basic metabolic panel with GFR; Future  Hyperlipidemia with target LDL less than 130 -     Rosuvastatin  Calcium ; Take 1 tablet (40 mg total) by mouth daily.  Dispense: 90 tablet; Refill: 1  LRTI (lower respiratory tract infection) -     Cefpodoxime  Proxetil; Take 1 tablet (200 mg total) by mouth 2 (two) times daily for 7 days.  Dispense: 14 tablet; Refill: 0 -     HYDROcodone  Bit-Homatrop MBr; Take 5 mLs by mouth every 8 (eight) hours as needed for cough.  Dispense: 120 mL; Refill: 0     Follow-up: Return in about 3 months (around 12/10/2024).  Debby Molt, MD "

## 2024-09-13 ENCOUNTER — Other Ambulatory Visit: Payer: Self-pay | Admitting: Internal Medicine

## 2024-09-13 DIAGNOSIS — J22 Unspecified acute lower respiratory infection: Secondary | ICD-10-CM

## 2024-09-13 MED ORDER — AMOXICILLIN-POT CLAVULANATE 875-125 MG PO TABS: 1.0000 | ORAL_TABLET | Freq: Two times a day (BID) | ORAL | 0 refills | Status: AC

## 2024-11-13 ENCOUNTER — Ambulatory Visit: Admitting: Family Medicine

## 2024-11-13 ENCOUNTER — Ambulatory Visit: Admitting: Internal Medicine

## 2025-06-01 ENCOUNTER — Ambulatory Visit
